# Patient Record
Sex: Male | Born: 1965 | Race: White | Hispanic: No | Marital: Married | Smoking: Never smoker
Health system: Southern US, Community
[De-identification: ages and names within clinical notes are randomized; demographics above are authoritative.]

## PROBLEM LIST (undated history)

## (undated) DIAGNOSIS — G4730 Sleep apnea, unspecified: Secondary | ICD-10-CM

## (undated) DIAGNOSIS — R7989 Other specified abnormal findings of blood chemistry: Secondary | ICD-10-CM

## (undated) DIAGNOSIS — G4710 Hypersomnia, unspecified: Secondary | ICD-10-CM

## (undated) DIAGNOSIS — J45909 Unspecified asthma, uncomplicated: Secondary | ICD-10-CM

## (undated) DIAGNOSIS — T7840XA Allergy, unspecified, initial encounter: Secondary | ICD-10-CM

## (undated) DIAGNOSIS — F329 Major depressive disorder, single episode, unspecified: Secondary | ICD-10-CM

## (undated) DIAGNOSIS — E559 Vitamin D deficiency, unspecified: Secondary | ICD-10-CM

## (undated) DIAGNOSIS — F419 Anxiety disorder, unspecified: Secondary | ICD-10-CM

## (undated) DIAGNOSIS — K589 Irritable bowel syndrome without diarrhea: Secondary | ICD-10-CM

## (undated) DIAGNOSIS — F32A Depression, unspecified: Secondary | ICD-10-CM

## (undated) DIAGNOSIS — N419 Inflammatory disease of prostate, unspecified: Secondary | ICD-10-CM

## (undated) DIAGNOSIS — I10 Essential (primary) hypertension: Secondary | ICD-10-CM

## (undated) DIAGNOSIS — K219 Gastro-esophageal reflux disease without esophagitis: Secondary | ICD-10-CM

## (undated) DIAGNOSIS — E785 Hyperlipidemia, unspecified: Secondary | ICD-10-CM

## (undated) DIAGNOSIS — R0902 Hypoxemia: Secondary | ICD-10-CM

## (undated) DIAGNOSIS — K649 Unspecified hemorrhoids: Secondary | ICD-10-CM

## (undated) DIAGNOSIS — J309 Allergic rhinitis, unspecified: Secondary | ICD-10-CM

## (undated) DIAGNOSIS — R5383 Other fatigue: Secondary | ICD-10-CM

## (undated) DIAGNOSIS — E119 Type 2 diabetes mellitus without complications: Secondary | ICD-10-CM

## (undated) DIAGNOSIS — R519 Headache, unspecified: Secondary | ICD-10-CM

## (undated) HISTORY — DX: Depression, unspecified: F32.A

## (undated) HISTORY — DX: Hyperlipidemia, unspecified: E78.5

## (undated) HISTORY — DX: Allergy, unspecified, initial encounter: T78.40XA

## (undated) HISTORY — DX: Unspecified hemorrhoids: K64.9

## (undated) HISTORY — DX: Allergic rhinitis, unspecified: J30.9

## (undated) HISTORY — DX: Anxiety disorder, unspecified: F41.9

## (undated) HISTORY — DX: Major depressive disorder, single episode, unspecified: F32.9

## (undated) HISTORY — DX: Inflammatory disease of prostate, unspecified: N41.9

## (undated) HISTORY — DX: Vitamin D deficiency, unspecified: E55.9

## (undated) HISTORY — DX: Headache, unspecified: R51.9

## (undated) HISTORY — DX: Other specified abnormal findings of blood chemistry: R79.89

## (undated) HISTORY — DX: Essential (primary) hypertension: I10

## (undated) HISTORY — DX: Sleep apnea, unspecified: G47.30

## (undated) HISTORY — DX: Other fatigue: R53.83

## (undated) HISTORY — DX: Type 2 diabetes mellitus without complications: E11.9

## (undated) HISTORY — PX: WISDOM TOOTH EXTRACTION: SHX21

## (undated) HISTORY — DX: Irritable bowel syndrome, unspecified: K58.9

## (undated) HISTORY — PX: KNEE ARTHROSCOPY: SUR90

## (undated) HISTORY — DX: Hypersomnia, unspecified: G47.10

## (undated) HISTORY — DX: Unspecified asthma, uncomplicated: J45.909

## (undated) HISTORY — DX: Hypoxemia: R09.02

## (undated) HISTORY — DX: Gastro-esophageal reflux disease without esophagitis: K21.9

---

## 1999-08-08 ENCOUNTER — Encounter: Admission: RE | Admit: 1999-08-08 | Discharge: 1999-08-08 | Payer: Self-pay | Admitting: Gastroenterology

## 1999-08-08 ENCOUNTER — Encounter: Payer: Self-pay | Admitting: Gastroenterology

## 2002-06-02 ENCOUNTER — Emergency Department (HOSPITAL_COMMUNITY): Admission: EM | Admit: 2002-06-02 | Discharge: 2002-06-03 | Payer: Self-pay | Admitting: Emergency Medicine

## 2004-04-09 ENCOUNTER — Encounter: Admission: RE | Admit: 2004-04-09 | Discharge: 2004-07-08 | Payer: Self-pay | Admitting: Family Medicine

## 2004-05-22 ENCOUNTER — Encounter: Admission: RE | Admit: 2004-05-22 | Discharge: 2004-05-22 | Payer: Self-pay | Admitting: Allergy and Immunology

## 2005-01-21 ENCOUNTER — Emergency Department (HOSPITAL_COMMUNITY): Admission: EM | Admit: 2005-01-21 | Discharge: 2005-01-21 | Payer: Self-pay | Admitting: Emergency Medicine

## 2005-04-07 ENCOUNTER — Emergency Department (HOSPITAL_COMMUNITY): Admission: EM | Admit: 2005-04-07 | Discharge: 2005-04-08 | Payer: Self-pay | Admitting: Emergency Medicine

## 2011-10-21 ENCOUNTER — Encounter (HOSPITAL_COMMUNITY): Payer: Self-pay | Admitting: Internal Medicine

## 2011-10-21 ENCOUNTER — Encounter: Payer: Self-pay | Admitting: *Deleted

## 2011-10-27 ENCOUNTER — Encounter (HOSPITAL_COMMUNITY): Payer: Self-pay

## 2011-10-27 ENCOUNTER — Ambulatory Visit (HOSPITAL_COMMUNITY)
Admission: RE | Admit: 2011-10-27 | Discharge: 2011-10-27 | Disposition: A | Discharge status: Home / Self Care | Payer: 59 | Source: Ambulatory Visit | Attending: Internal Medicine | Admitting: Internal Medicine

## 2011-10-27 VITALS — BP 112/52 | HR 98 | Ht 72.0 in | Wt 248.8 lb

## 2011-10-27 DIAGNOSIS — R0602 Shortness of breath: Secondary | ICD-10-CM | POA: Insufficient documentation

## 2011-10-27 DIAGNOSIS — I10 Essential (primary) hypertension: Secondary | ICD-10-CM | POA: Insufficient documentation

## 2011-10-27 DIAGNOSIS — R0600 Dyspnea, unspecified: Secondary | ICD-10-CM | POA: Insufficient documentation

## 2011-10-27 DIAGNOSIS — R0609 Other forms of dyspnea: Secondary | ICD-10-CM | POA: Insufficient documentation

## 2011-10-27 DIAGNOSIS — R0989 Other specified symptoms and signs involving the circulatory and respiratory systems: Secondary | ICD-10-CM | POA: Insufficient documentation

## 2011-10-27 NOTE — Patient Instructions (Addendum)
Your physician has recommended that you have a cardiopulmonary stress test (CPX). CPX testing is a non-invasive measurement of heart and lung function. It replaces a traditional treadmill stress test. This type of test provides a tremendous amount of information that relates not only to your present condition but also for future outcomes. This test combines measurements of you ventilation, respiratory gas exchange in the lungs, electrocardiogram (EKG), blood pressure and physical response before, during, and following an exercise protocol.  Your physician recommends that you schedule a follow-up appointment in: 4 WEEKS

## 2011-10-27 NOTE — Progress Notes (Signed)
Referring Physician: Dr. Blenda Nicely  Reason for Consultation: Dyspnea  HPI:  Donald Payne is a 46 y/o male who works at the [employer] with a h/o asthma, HTN, DM2 (x 8 years) and severe OSA referred for further evaluation of dyspnea.  Denies any known h/o heart disease. Has never had a stress test or cath.  Says every summer for past few summers he gets very SOB. No wheezing but does have a lot of drainage from his nose. In the winter he says he is fine and has no problem at all. Denies orthopnea, PND or LE edema. He said about 4 weeks ago was able to jog a mile early in the morning without a problem. Denies exertional CP. Says lungs feel "tight" at times. Says SOB not that much worse with exercise. Says he just feels like he can't expand his lungs and get full breath. Nonsmoker. Had allergy testing in past - mainly allergic to dust mites. Has 2 cats and a dog and feels he is worse when he is in his room.  Apparently was at Dr. Terrilee Croak office and initial sat was 89% but came up to 95% with exertion. Started on inhalers. Has since bough his own pulse oximeter and sats always 93% or greater even when he is having trouble breathing. Has not had recent CXR.   Had echo EF 55-60% borderline LVH. Normal diastolic function. Normal RV. Trace TR. Normal pulmonary pressures.    Review of Systems:     Cardiac Review of Systems: {Y] = yes [ ]  = no  Chest Pain [    ]  Resting SOB Cove.Etienne   ] Exertional SOB  [ y ]  Orthopnea [  ]   Pedal Edema [   ]    Palpitations [  ] Syncope  [  ]   Presyncope [   ]  General Review of Systems: [Y] = yes [  ]=no Constitional: recent weight change [  ]; anorexia [  ]; fatigue [  ]; nausea [  ]; night sweats [  ]; fever [  ]; or chills [  ];                                                                                                                                          Dental: poor dentition[  ];   Eye : blurred vision [  ]; diplopia [   ]; vision changes [  ];  Amaurosis  fugax[  ]; Resp: cough [  ];  wheezing[  ];  hemoptysis[  ]; shortness of breath[ y ]; paroxysmal nocturnal dyspnea[  ]; dyspnea on exertion[ y ]; or orthopnea[  ];  GI:  gallstones[  ], vomiting[  ];  dysphagia[  ]; melena[  ];  hematochezia [  ]; heartburn[  ];    GU: kidney stones [  ]; hematuria[  ];  dysuria [  ];  nocturia[  ];  history of     obstruction [  ];                 Skin: rash, swelling[  ];, hair loss[  ];  peripheral edema[  ];  or itching[  ]; Musculosketetal: myalgias[  ];  joint swelling[  ];  joint erythema[  ];  joint pain[  ];  back pain[  ];  Heme/Lymph: bruising[  ];  bleeding[  ];  anemia[  ];  Neuro: TIA[  ];  headaches[  ];  stroke[  ];  vertigo[  ];  seizures[  ];   paresthesias[  ];  difficulty walking[  ];  Psych:depression[  ]; anxiety[  ];  Other:  Past Medical History  Diagnosis Date  . Hypoxia   . Vitamin d deficiency   . Fatigue   . Allergic rhinitis   . Sleep apnea with hypersomnolence   . Hypertension   . Diabetes mellitus     History   Social History  . Marital Status: Married    Spouse Name: N/A    Number of Children: N/A  . Years of Education: N/A   Occupational History  . sheriff deputy    Social History Main Topics  . Smoking status: Never Smoker   . Smokeless tobacco: Not on file  . Alcohol Use: Yes     occasional  . Drug Use: No  . Sexually Active: Not on file   Other Topics Concern  . Not on file   Social History Narrative  . No narrative on file    Family History  Problem Relation Age of Onset  . Prostate cancer Paternal Grandfather   . Healthy    . Coronary artery disease      Deceased age 4    PHYSICAL EXAM: Filed Vitals:   10/27/11 1033  BP: 112/52  Pulse: 98    General:  Well appearing. No respiratory difficulty HEENT: normal Neck: supple. no JVD. Carotids 2+ bilat; no bruits. No lymphadenopathy or thryomegaly appreciated. Cor: PMI nondisplaced. Regular rate & rhythm. No rubs, gallops or  murmurs. Lungs: clear  Mildly prolonged expiratory phase. No wheeze Abdomen: soft, nontender, nondistended. No hepatosplenomegaly. No bruits or masses. Good bowel sounds. Extremities: no cyanosis, clubbing, rash, edema Neuro: alert & oriented x 3, cranial nerves grossly intact. moves all 4 extremities w/o difficulty. Affect pleasant.  ECG: NSR 87 No ST-T wave abnormalities.

## 2011-10-27 NOTE — Assessment & Plan Note (Signed)
I am not sure what is causing his dyspnea and borderline hypoxia. By history I am more concerned about exercise-induced bronchospasm or underlying allergies. He is also at risk for underlying CAD. Thus at this point, I have suggested we start with cardiopulmonary stress testing to assess his degree of limitation and also try to help classify it between cardiac/pulmonary/deconditioning.

## 2011-10-28 NOTE — Addendum Note (Signed)
Encounter addended by: Deitra Mayo on: 10/28/2011  8:02 AM<BR>     Documentation filed: Charges VN

## 2011-11-05 ENCOUNTER — Ambulatory Visit (HOSPITAL_COMMUNITY): Payer: 59 | Attending: Internal Medicine

## 2011-11-05 DIAGNOSIS — R0602 Shortness of breath: Secondary | ICD-10-CM | POA: Insufficient documentation

## 2011-11-17 ENCOUNTER — Ambulatory Visit (HOSPITAL_COMMUNITY)
Admission: RE | Admit: 2011-11-17 | Discharge: 2011-11-17 | Disposition: A | Discharge status: Home / Self Care | Payer: 59 | Source: Ambulatory Visit | Attending: Internal Medicine | Admitting: Internal Medicine

## 2011-11-17 ENCOUNTER — Encounter (HOSPITAL_COMMUNITY): Payer: Self-pay

## 2011-11-17 VITALS — BP 120/72 | HR 86 | Ht 72.0 in | Wt 245.8 lb

## 2011-11-17 DIAGNOSIS — R0989 Other specified symptoms and signs involving the circulatory and respiratory systems: Secondary | ICD-10-CM | POA: Insufficient documentation

## 2011-11-17 DIAGNOSIS — R0602 Shortness of breath: Secondary | ICD-10-CM

## 2011-11-17 DIAGNOSIS — R0609 Other forms of dyspnea: Secondary | ICD-10-CM | POA: Insufficient documentation

## 2011-11-17 DIAGNOSIS — R0600 Dyspnea, unspecified: Secondary | ICD-10-CM

## 2011-11-17 NOTE — Progress Notes (Signed)
Referring Physician: Dr. Blenda Nicely PCP: Dr. Margarita Rana  HPI:  Donald Payne is a 46 y/o male who works at the [employer] with a h/o asthma, HTN, DM2 (x 8 years) and severe OSA referred for further evaluation of dyspnea.  Denies any known h/o heart disease. Has never had a stress test or cath.  Nonsmoker.  Had allergy testing in past - mainly allergic to dust mites.  Has 2 cats and a dog and feels he is worse when he is in his room.  Apparently was at Dr. Terrilee Croak office and initial sat was 89% but came up to 95% with exertion. Started on inhalers. Has since bough his own pulse oximeter and sats always 93% or greater even when he is having trouble breathing. Has not had recent CXR.   Had echo EF 55-60% borderline LVH. Normal diastolic function. Normal RV. Trace TR. Normal pulmonary pressures.   CPX 11/05/11 Peak VO2 29.1 ml/kg/min predicted peak VO2 93.4%, VE/VCO2 slope 27.3, RER 1.33. No O2 desaturations. (Pulse ox stayed at 97%)  He returns for follow up today.  Dyspnea improved when temperatures declined.  Noted increased dyspnea after taking long hot shower, pulse-ox after shower ~90%.  He denies chest pain.  No edema, orthopnea/PND. Started on Singulair.    Review of Systems: All pertinent positives and negatives as in HPI, otherwise negative.   Past Medical History  Diagnosis Date  . Hypoxia   . Vitamin D deficiency   . Fatigue   . Allergic rhinitis   . Sleep apnea with hypersomnolence   . Hypertension   . Diabetes mellitus    Current Outpatient Prescriptions on File Prior to Encounter  Medication Sig Dispense Refill  . albuterol (PROVENTIL HFA;VENTOLIN HFA) 108 (90 BASE) MCG/ACT inhaler Inhale 2 puffs into the lungs 4 (four) times daily as needed.      Marland Kitchen albuterol (PROVENTIL) (2.5 MG/3ML) 0.083% nebulizer solution Take 2.5 mg by nebulization 4 (four) times daily as needed.      . ALPRAZolam (XANAX) 1 MG tablet Take 1 mg by mouth 2 (two) times daily.      Marland Kitchen aspirin 81 MG tablet Take 81  mg by mouth daily.      . budesonide (PULMICORT) 0.25 MG/2ML nebulizer solution Take 0.25 mg by nebulization 2 (two) times daily.      Marland Kitchen buPROPion (WELLBUTRIN XL) 300 MG 24 hr tablet Take 300 mg by mouth daily.      . cholecalciferol (VITAMIN D) 1000 UNITS tablet Take 1,000 Units by mouth daily. Takes 3 tabs daily      . ciclesonide (ALVESCO) 160 MCG/ACT inhaler Inhale 1 puff into the lungs 2 (two) times daily.      . Cinnamon 500 MG TABS Take 500 mg by mouth 2 (two) times daily.      . fish oil-omega-3 fatty acids 1000 MG capsule Take 1 g by mouth 3 (three) times daily.      . metFORMIN (GLUCOPHAGE) 500 MG tablet Take 500 mg by mouth 2 (two) times daily with a meal.      . Multiple Vitamin (MULTIVITAMIN) tablet Take 1 tablet by mouth daily.      . quinapril-hydrochlorothiazide (ACCURETIC) 20-12.5 MG per tablet Take 2 tablets by mouth daily.       . sertraline (ZOLOFT) 100 MG tablet Take 200 mg by mouth daily.       Marland Kitchen testosterone cypionate (DEPOTESTOTERONE CYPIONATE) 200 MG/ML injection Inject 200 mg into the muscle every 14 (fourteen) days. Inject 10ml every  2 weeks      . zolpidem (AMBIEN CR) 12.5 MG CR tablet Take 6.25 mg by mouth at bedtime as needed.        No Known Allergies  PHYSICAL EXAM: Filed Vitals:   11/17/11 1001  BP: 120/72  Pulse: 86  Height: 6' (1.829 m)  Weight: 245 lb 12.8 oz (111.494 kg)  SpO2: 97%    General:  Well appearing. No respiratory difficulty HEENT: normal Neck: supple. no JVD. Carotids 2+ bilat; no bruits. No lymphadenopathy or thryomegaly appreciated. Cor: PMI nondisplaced. Regular rate & rhythm. No rubs, gallops or murmurs. Lungs: clear  Mildly prolonged expiratory phase. No wheeze Abdomen: soft, nontender, nondistended. No hepatosplenomegaly. No bruits or masses. Good bowel sounds. Extremities: no cyanosis, clubbing, rash, edema Neuro: alert & oriented x 3, cranial nerves grossly intact. moves all 4 extremities w/o difficulty. Affect  pleasant.

## 2011-11-17 NOTE — Assessment & Plan Note (Signed)
Patient seen and examined with Donald Blossom, PA-C. We discussed all aspects of the encounter. I agree with the assessment as stated above. My thoughts below. Reviewed CPX results with him. His test was normal with no cardiopulmonary abnormalities. Limitation appeared to be deconditioning and obesity. Advised to start exercise program. He seems to be particularly sensitive to working out in the humidity - his brother has the same problem - so will need to exercise indoors in the summer. Can return in 6 months if symptoms not improving.

## 2012-03-27 ENCOUNTER — Other Ambulatory Visit: Payer: Self-pay

## 2012-12-16 ENCOUNTER — Other Ambulatory Visit: Payer: Self-pay

## 2016-03-08 DIAGNOSIS — Z0100 Encounter for examination of eyes and vision without abnormal findings: Secondary | ICD-10-CM | POA: Diagnosis not present

## 2016-04-17 ENCOUNTER — Encounter: Payer: Self-pay | Admitting: Internal Medicine

## 2016-04-17 DIAGNOSIS — E119 Type 2 diabetes mellitus without complications: Secondary | ICD-10-CM | POA: Diagnosis not present

## 2016-04-17 DIAGNOSIS — J309 Allergic rhinitis, unspecified: Secondary | ICD-10-CM | POA: Diagnosis not present

## 2016-04-17 DIAGNOSIS — R1032 Left lower quadrant pain: Secondary | ICD-10-CM | POA: Diagnosis not present

## 2016-05-12 DIAGNOSIS — G4733 Obstructive sleep apnea (adult) (pediatric): Secondary | ICD-10-CM | POA: Diagnosis not present

## 2016-05-20 DIAGNOSIS — E119 Type 2 diabetes mellitus without complications: Secondary | ICD-10-CM | POA: Diagnosis not present

## 2016-05-21 ENCOUNTER — Ambulatory Visit (INDEPENDENT_AMBULATORY_CARE_PROVIDER_SITE_OTHER): Payer: 59 | Admitting: Internal Medicine

## 2016-05-21 ENCOUNTER — Encounter: Payer: Self-pay | Admitting: Internal Medicine

## 2016-05-21 VITALS — BP 110/72 | HR 80 | Ht 72.0 in | Wt 238.6 lb

## 2016-05-21 DIAGNOSIS — R1032 Left lower quadrant pain: Secondary | ICD-10-CM | POA: Diagnosis not present

## 2016-05-21 DIAGNOSIS — Z1211 Encounter for screening for malignant neoplasm of colon: Secondary | ICD-10-CM | POA: Diagnosis not present

## 2016-05-21 DIAGNOSIS — K219 Gastro-esophageal reflux disease without esophagitis: Secondary | ICD-10-CM | POA: Diagnosis not present

## 2016-05-21 NOTE — Patient Instructions (Signed)
You will be contacted regarding scheduling your colonoscopy

## 2016-05-21 NOTE — Progress Notes (Signed)
HISTORY OF PRESENT ILLNESS:  Donald Payne is a 51 y.o. male , [facility], who is referred by his primary care provider Donald Payne with chief complaints of left lower quadrant abdominal pain, the need for colonoscopy, and GERD. First, patient reports a one-month history of left lower quadrant pain with slight radiation toward the left flank. There was no associated fever or bleeding. The discomfort was not affected by meals or bowel movements. No change in bowel movements or bowel habits. He does carry a diagnosis of irritable bowel syndrome evaluated by Donald Payne remotely. According to the patient he had a flexible sigmoidoscopy. Also told he may be lactose intolerant. He does describe some chronic but stable alternating bowel habits. He being C may have diverticulosis as well. He does have a history of kidney stones but denies hematuria or dysuria. His discomfort resolved but then returned translate for a few days. He could not identify any exacerbating or relieving factors. No nocturnal component. No problems with pain at this time. Next, he reports chronic GERD. He was placed on PPI which helped. He did develop some loose stools which he thought possibly was related PPI thus he stopped. No dysphagia. He has had decrease weight loss which he attributes to his diabetic medication. I was sorry to hear that his brother passed away last week a 15  REVIEW OF SYSTEMS:  All non-GI ROS negative except for sinus and allergy trouble, urinary leakage, insomnia  Past Medical History:  Diagnosis Date  . Allergic rhinitis   . Anxiety   . Asthma   . Depression   . Diabetes mellitus (HCC)   . Dyslipidemia   . Fatigue   . GERD (gastroesophageal reflux disease)   . Hemorrhoids   . Hypertension   . Hypoxia   . IBS (irritable bowel syndrome)   . Low testosterone   . Prostatitis   . Sleep apnea with hypersomnolence   . Vitamin D deficiency     Past Surgical History:   Procedure Laterality Date  . WISDOM TOOTH EXTRACTION      Social History Donald Payne  reports that he has never smoked. He has never used smokeless tobacco. He reports that he drinks alcohol. He reports that he does not use drugs.  family history includes Diabetes in his mother and paternal grandmother; Heart disease in his father and paternal grandmother; Prostate cancer in his paternal grandfather.  No Known Allergies     PHYSICAL EXAMINATION: Vital signs: BP 110/72   Pulse 80   Ht 6' (1.829 m)   Wt 238 lb 9.6 oz (108.2 kg)   BMI 32.36 kg/m   Constitutional: generally well-appearing, no acute distress Psychiatric: alert and oriented x3, cooperative Eyes: extraocular movements intact, anicteric, conjunctiva pink Mouth: oral pharynx moist, no lesions Neck: supple no lymphadenopathy Cardiovascular: heart regular rate and rhythm, no murmur Lungs: clear to auscultation bilaterally Abdomen: soft, nontender, nondistended, no obvious ascites, no peritoneal signs, normal bowel sounds, no organomegaly Rectal:Deferred until colonoscopy Extremities: no clubbing cyanosis or lower extremity edema bilaterally Skin: no lesions on visible extremities Neuro: No focal deficits. Cranial nerves intact  ASSESSMENT:  #1. Transient left lower quadrant pain as described. Possibly related to particular disease. Other potential etiologies include musculoskeletal, urologic. #2. Colon cancer screening. Baseline risk. Appropriate candidate without contraindication #3. GERD without alarm symptoms #4. Reported history of IBS, lactose intolerance, and diverticulosis  PLAN:  #1. Reflux precautions #2. Resume PPI to control GERD symptoms. Observe  for recurrence of diarrhea #3. Colonoscopy to provide colorectal cancer screening and evaluate left-sided abdominal complaints.The nature of the procedure, as well as the risks, benefits, and alternatives were carefully and thoroughly reviewed with the  patient. Ample time for discussion and questions allowed. The patient understood, was satisfied, and agreed to proceed. #4. Adjust diabetic medications the day of his procedure to avoid and wanted hypoglycemia  A copy of this consultation note has been sent to Donald Payne

## 2016-05-29 DIAGNOSIS — I10 Essential (primary) hypertension: Secondary | ICD-10-CM | POA: Diagnosis not present

## 2016-05-29 DIAGNOSIS — E7800 Pure hypercholesterolemia, unspecified: Secondary | ICD-10-CM | POA: Diagnosis not present

## 2016-05-29 DIAGNOSIS — E1165 Type 2 diabetes mellitus with hyperglycemia: Secondary | ICD-10-CM | POA: Diagnosis not present

## 2016-06-09 ENCOUNTER — Encounter: Payer: Self-pay | Admitting: Internal Medicine

## 2016-07-08 DIAGNOSIS — H00022 Hordeolum internum right lower eyelid: Secondary | ICD-10-CM | POA: Diagnosis not present

## 2016-08-06 ENCOUNTER — Encounter: Payer: Self-pay | Admitting: Internal Medicine

## 2016-08-06 ENCOUNTER — Ambulatory Visit (AMBULATORY_SURGERY_CENTER): Payer: Self-pay

## 2016-08-06 VITALS — Ht 72.0 in | Wt 230.0 lb

## 2016-08-06 DIAGNOSIS — R1032 Left lower quadrant pain: Secondary | ICD-10-CM

## 2016-08-06 MED ORDER — NA SULFATE-K SULFATE-MG SULF 17.5-3.13-1.6 GM/177ML PO SOLN: 1.0000 | Freq: Once | ORAL | 0 refills | Status: AC

## 2016-08-06 NOTE — Progress Notes (Signed)
Denies allergies to eggs or soy products. Denies complication of anesthesia or sedation. Denies use of weight loss medication. Denies use of O2.   Emmi instructions declined.  

## 2016-08-20 ENCOUNTER — Encounter: Payer: Self-pay | Admitting: Internal Medicine

## 2016-08-20 ENCOUNTER — Ambulatory Visit (AMBULATORY_SURGERY_CENTER): Payer: 59 | Admitting: Internal Medicine

## 2016-08-20 VITALS — BP 107/77 | HR 84 | Temp 99.3°F | Resp 11 | Ht 72.0 in | Wt 238.0 lb

## 2016-08-20 DIAGNOSIS — Z1211 Encounter for screening for malignant neoplasm of colon: Secondary | ICD-10-CM | POA: Diagnosis present

## 2016-08-20 DIAGNOSIS — Z1212 Encounter for screening for malignant neoplasm of rectum: Secondary | ICD-10-CM | POA: Diagnosis not present

## 2016-08-20 DIAGNOSIS — R1032 Left lower quadrant pain: Secondary | ICD-10-CM

## 2016-08-20 DIAGNOSIS — K5730 Diverticulosis of large intestine without perforation or abscess without bleeding: Secondary | ICD-10-CM | POA: Diagnosis not present

## 2016-08-20 MED ORDER — SODIUM CHLORIDE 0.9 % IV SOLN: 500.0000 mL | INTRAVENOUS | Status: DC

## 2016-08-20 NOTE — Op Note (Signed)
[facility] Patient Name: Donald Payne Procedure Date: 08/20/2016 7:23 AM MRN: [id number] Endoscopist: Wilhemina Bonito. Marina Goodell , MD Age: 51 Referring MD:  Date of Birth: Jul 19, 1965 Gender: Male Account #: [id number] Procedure:                Colonoscopy Indications:              Screening for colorectal malignant neoplasm Medicines:                Monitored Anesthesia Care Procedure:                Pre-Anesthesia Assessment:                           - Prior to the procedure, a History and Physical                            was performed, and patient medications and                            allergies were reviewed. The patient's tolerance of                            previous anesthesia was also reviewed. The risks                            and benefits of the procedure and the sedation                            options and risks were discussed with the patient.                            All questions were answered, and informed consent                            was obtained. Prior Anticoagulants: The patient has                            taken no previous anticoagulant or antiplatelet                            agents. ASA Grade Assessment: II - A patient with                            mild systemic disease. After reviewing the risks                            and benefits, the patient was deemed in                            satisfactory condition to undergo the procedure.                           After obtaining informed consent, the colonoscope  was passed under direct vision. Throughout the                            procedure, the patient's blood pressure, pulse, and                            oxygen saturations were monitored continuously. The                            Colonoscope was introduced through the anus and                            advanced to the the cecum, identified by                            appendiceal orifice and  ileocecal valve. The                            ileocecal valve, appendiceal orifice, and rectum                            were photographed. The quality of the bowel                            preparation was good. The colonoscopy was performed                            without difficulty. The patient tolerated the                            procedure well. The bowel preparation used was                            SUPREP. Scope In: 8:28:29 AM Scope Out: 8:42:53 AM Scope Withdrawal Time: 0 hours 12 minutes 7 seconds  Total Procedure Duration: 0 hours 14 minutes 24 seconds  Findings:                 Multiple medium-mouthed diverticula were found in                            the sigmoid colon.                           The exam was otherwise without abnormality on                            direct and retroflexion views. Complications:            No immediate complications. Estimated blood loss:                            None. Estimated Blood Loss:     Estimated blood loss: none. Impression:               - Diverticulosis in the sigmoid colon.                           -  The examination was otherwise normal on direct                            and retroflexion views.                           - No specimens collected. Recommendation:           - Repeat colonoscopy in 10 years for screening                            purposes.                           - Patient has a contact number available for                            emergencies. The signs and symptoms of potential                            delayed complications were discussed with the                            patient. Return to normal activities tomorrow.                            Written discharge instructions were provided to the                            patient.                           - Resume previous diet.                           - Continue present medications.                           - Daily fiber  supplementation and high fiber diet John N. Marina Goodell, MD 08/20/2016 8:48:44 AM This report has been signed electronically.

## 2016-08-20 NOTE — Patient Instructions (Signed)
Handouts given: Diverticulosis and High Fiber Diet    YOU HAD AN ENDOSCOPIC PROCEDURE TODAY AT THE [facility]:   Refer to the procedure report that was given to you for any specific questions about what was found during the examination.  If the procedure report does not answer your questions, please call your gastroenterologist to clarify.  If you requested that your care partner not be given the details of your procedure findings, then the procedure report has been included in a sealed envelope for you to review at your convenience later.  YOU SHOULD EXPECT: Some feelings of bloating in the abdomen. Passage of more gas than usual.  Walking can help get rid of the air that was put into your GI tract during the procedure and reduce the bloating. If you had a lower endoscopy (such as a colonoscopy or flexible sigmoidoscopy) you may notice spotting of blood in your stool or on the toilet paper. If you underwent a bowel prep for your procedure, you may not have a normal bowel movement for a few days.  Please Note:  You might notice some irritation and congestion in your nose or some drainage.  This is from the oxygen used during your procedure.  There is no need for concern and it should clear up in a day or so.  SYMPTOMS TO REPORT IMMEDIATELY:   Following lower endoscopy (colonoscopy or flexible sigmoidoscopy):  Excessive amounts of blood in the stool  Significant tenderness or worsening of abdominal pains  Swelling of the abdomen that is new, acute  Fever of 100F or higher   For urgent or emergent issues, a gastroenterologist can be reached at any hour by calling [phone].   DIET:  We do recommend a small meal at first, but then you may proceed to your regular diet.  Drink plenty of fluids but you should avoid alcoholic beverages for 24 hours.  ACTIVITY:  You should plan to take it easy for the rest of today and you should NOT DRIVE or use heavy machinery until tomorrow  (because of the sedation medicines used during the test).    FOLLOW UP: Our staff will call the number listed on your records the next business day following your procedure to check on you and address any questions or concerns that you may have regarding the information given to you following your procedure. If we do not reach you, we will leave a message.  However, if you are feeling well and you are not experiencing any problems, there is no need to return our call.  We will assume that you have returned to your regular daily activities without incident.  If any biopsies were taken you will be contacted by phone or by letter within the next 1-3 weeks.  Please call us at [phone] if you have not heard about the biopsies in 3 weeks.    SIGNATURES/CONFIDENTIALITY: You and/or your care partner have signed paperwork which will be entered into your electronic medical record.  These signatures attest to the fact that that the information above on your After Visit Summary has been reviewed and is understood.  Full responsibility of the confidentiality of this discharge information lies with you and/or your care-partner.

## 2016-08-20 NOTE — Progress Notes (Signed)
Report to PACU, RN, vss, BBS= Clear.  

## 2016-08-20 NOTE — Progress Notes (Signed)
Pt's states no medical or surgical changes since previsit or office visit. 

## 2016-08-21 ENCOUNTER — Telehealth: Payer: Self-pay | Admitting: *Deleted

## 2016-08-21 NOTE — Telephone Encounter (Signed)
  Follow up Call-  Call back number 08/20/2016  Post procedure Call Back phone  # [phone] cell  Permission to leave phone message Yes  Some recent data might be hidden     Patient questions:  Do you have a fever, pain , or abdominal swelling? No. Pain Score  0 *  Have you tolerated food without any problems? Yes.    Have you been able to return to your normal activities? Yes.    Do you have any questions about your discharge instructions: Diet   No. Medications  No. Follow up visit  No.  Do you have questions or concerns about your Care? No.  Actions: * If pain score is 4 or above: No action needed, pain <4.

## 2016-09-03 DIAGNOSIS — G4733 Obstructive sleep apnea (adult) (pediatric): Secondary | ICD-10-CM | POA: Diagnosis not present

## 2016-09-04 DIAGNOSIS — I10 Essential (primary) hypertension: Secondary | ICD-10-CM | POA: Diagnosis not present

## 2016-09-04 DIAGNOSIS — E7800 Pure hypercholesterolemia, unspecified: Secondary | ICD-10-CM | POA: Diagnosis not present

## 2016-09-04 DIAGNOSIS — E1165 Type 2 diabetes mellitus with hyperglycemia: Secondary | ICD-10-CM | POA: Diagnosis not present

## 2016-10-01 DIAGNOSIS — M7502 Adhesive capsulitis of left shoulder: Secondary | ICD-10-CM | POA: Diagnosis not present

## 2016-10-01 DIAGNOSIS — M7582 Other shoulder lesions, left shoulder: Secondary | ICD-10-CM | POA: Diagnosis not present

## 2016-11-10 ENCOUNTER — Other Ambulatory Visit: Payer: Self-pay | Admitting: Family Medicine

## 2016-11-10 ENCOUNTER — Ambulatory Visit
Admission: RE | Admit: 2016-11-10 | Discharge: 2016-11-10 | Disposition: A | Discharge status: Home / Self Care | Payer: 59 | Source: Ambulatory Visit | Attending: Family Medicine | Admitting: Family Medicine

## 2016-11-10 DIAGNOSIS — M779 Enthesopathy, unspecified: Secondary | ICD-10-CM

## 2016-11-10 DIAGNOSIS — M19012 Primary osteoarthritis, left shoulder: Secondary | ICD-10-CM | POA: Diagnosis not present

## 2017-02-20 DIAGNOSIS — E7800 Pure hypercholesterolemia, unspecified: Secondary | ICD-10-CM | POA: Diagnosis not present

## 2017-02-20 DIAGNOSIS — E1165 Type 2 diabetes mellitus with hyperglycemia: Secondary | ICD-10-CM | POA: Diagnosis not present

## 2017-02-27 DIAGNOSIS — E1165 Type 2 diabetes mellitus with hyperglycemia: Secondary | ICD-10-CM | POA: Diagnosis not present

## 2017-02-27 DIAGNOSIS — E7800 Pure hypercholesterolemia, unspecified: Secondary | ICD-10-CM | POA: Diagnosis not present

## 2017-02-27 DIAGNOSIS — I10 Essential (primary) hypertension: Secondary | ICD-10-CM | POA: Diagnosis not present

## 2017-06-04 DIAGNOSIS — Z79899 Other long term (current) drug therapy: Secondary | ICD-10-CM | POA: Diagnosis not present

## 2017-07-23 DIAGNOSIS — Z79891 Long term (current) use of opiate analgesic: Secondary | ICD-10-CM | POA: Diagnosis not present

## 2017-08-24 DIAGNOSIS — E1165 Type 2 diabetes mellitus with hyperglycemia: Secondary | ICD-10-CM | POA: Diagnosis not present

## 2017-08-24 DIAGNOSIS — E7800 Pure hypercholesterolemia, unspecified: Secondary | ICD-10-CM | POA: Diagnosis not present

## 2017-08-27 DIAGNOSIS — I10 Essential (primary) hypertension: Secondary | ICD-10-CM | POA: Diagnosis not present

## 2017-08-27 DIAGNOSIS — E1165 Type 2 diabetes mellitus with hyperglycemia: Secondary | ICD-10-CM | POA: Diagnosis not present

## 2017-08-27 DIAGNOSIS — E7800 Pure hypercholesterolemia, unspecified: Secondary | ICD-10-CM | POA: Diagnosis not present

## 2017-09-11 ENCOUNTER — Ambulatory Visit: Payer: 59 | Admitting: Internal Medicine

## 2017-09-25 DIAGNOSIS — J309 Allergic rhinitis, unspecified: Secondary | ICD-10-CM | POA: Diagnosis not present

## 2017-09-25 DIAGNOSIS — I10 Essential (primary) hypertension: Secondary | ICD-10-CM | POA: Diagnosis not present

## 2017-09-25 DIAGNOSIS — E7800 Pure hypercholesterolemia, unspecified: Secondary | ICD-10-CM | POA: Diagnosis not present

## 2017-10-27 ENCOUNTER — Ambulatory Visit: Payer: 59 | Admitting: Internal Medicine

## 2017-10-27 ENCOUNTER — Encounter: Payer: Self-pay | Admitting: Internal Medicine

## 2017-10-27 ENCOUNTER — Encounter

## 2017-10-27 VITALS — BP 110/62 | HR 90 | Ht 72.0 in | Wt 236.0 lb

## 2017-10-27 DIAGNOSIS — K5730 Diverticulosis of large intestine without perforation or abscess without bleeding: Secondary | ICD-10-CM

## 2017-10-27 DIAGNOSIS — R1032 Left lower quadrant pain: Secondary | ICD-10-CM

## 2017-10-27 DIAGNOSIS — R194 Change in bowel habit: Secondary | ICD-10-CM | POA: Diagnosis not present

## 2017-10-27 NOTE — Patient Instructions (Addendum)
As discussed with Dr. Marina GoodellPerry, take 1-2 tablespoons of Metamucil daily  Take 1 Restora twice a day for 2 weeks.    Please follow up with Dr. Marina GoodellPerry in two months (November)

## 2017-10-27 NOTE — Progress Notes (Signed)
HISTORY OF PRESENT ILLNESS:  Donald Payne is a 52 y.o. male , [facility], who presents today with a chief complaint of change in bowel habits. The patient was last evaluated in this office April 2018 regarding left lower quadrant pain without associations. Previously evaluated by Dr. Carman Ching and labeled with irritable bowel syndrome. He underwent complete colonoscopy 08/20/2016. He was found to have significant sigmoid diverticulosis. Examination was otherwise normal. Follow-up in 10 years recommended. Patient tells me that he has continued with occasional left lower quadrant pain, but no worse. Improved after passing flatus and a predictable manner.GI complaint today is a 3 month history of change in bowel habits as manifested initially by significant diarrhea. Problems with urgency. Over time the symptoms have improved. He did take fiber one which helps some aspects of his symptom complex. He also taken and unspecified probiotic. No problems with bleeding. No nocturnal component. His weight has been stable. No fevers or other issues. Currently he describes having a normal bowel movement in the morning and may or may not have one or 2 somewhat loose bowels thereafter. May go a day without a bowel movement. GI review of systems is otherwise negative except for bloating and belching. No new medications. However, he did have an increase in his lithium dose around the time of his bowel habit change  REVIEW OF SYSTEMS:  All non-GI ROS negative unless otherwise stated in the history of present illness except for visual change  Past Medical History:  Diagnosis Date  . Allergic rhinitis   . Allergy   . Anxiety   . Asthma   . Depression   . Diabetes mellitus (HCC)   . Dyslipidemia   . Fatigue   . GERD (gastroesophageal reflux disease)   . Hemorrhoids   . Hyperlipidemia   . Hypertension   . Hypoxia   . IBS (irritable bowel syndrome)   . Low testosterone   . Prostatitis    . Sleep apnea    uses CPAP  . Sleep apnea with hypersomnolence   . Vitamin D deficiency     Past Surgical History:  Procedure Laterality Date  . WISDOM TOOTH EXTRACTION      Social History TJ KITCHINGS  reports that he has never smoked. He has never used smokeless tobacco. He reports that he drinks alcohol. He reports that he does not use drugs.  family history includes Coronary artery disease in his unknown relative; Diabetes in his mother and paternal grandmother; Healthy in his unknown relative; Heart disease in his father and paternal grandmother; Prostate cancer in his paternal grandfather.  No Known Allergies     PHYSICAL EXAMINATION: Vital signs: BP 110/62   Pulse 90   Ht 6' (1.829 m)   Wt 236 lb (107 kg)   BMI 32.01 kg/m   Constitutional: generally well-appearing, no acute distress Psychiatric: alert and oriented x3, cooperative Eyes: extraocular movements intact, anicteric, conjunctiva pink Mouth: oral pharynx moist, no lesions Neck: supple no lymphadenopathy Cardiovascular: heart regular rate and rhythm, no murmur Lungs: clear to auscultation bilaterally Abdomen: soft, nontender, nondistended, no obvious ascites, no peritoneal signs, normal bowel sounds, no organomegaly Rectal:omitted Extremities: no clubbing, cyanosis, or lower extremity edema bilaterally Skin: no lesions on visible extremities Neuro: No focal deficits. Cranial nerves intact  ASSESSMENT:  #1. Change in bowel habits as manifested by increased frequency. Slowly improving with time. Suspect post infectious irritable bowel syndrome. No alarm features #2. Intermittent left lower quadrant pain consistent with  diverticular spasm and/or trapped gas #3. Colonoscopy July 2018 with diverticulosis  PLAN:  #1. Daily fiber supplementation with Metamucil one or 2 tablespoons #2. Probiotic Restora 1 twice daily for 2 weeks. Samples given #3. Routine GI follow-up 2 months

## 2017-12-03 DIAGNOSIS — R5383 Other fatigue: Secondary | ICD-10-CM | POA: Diagnosis not present

## 2017-12-15 DIAGNOSIS — R232 Flushing: Secondary | ICD-10-CM | POA: Diagnosis not present

## 2017-12-15 DIAGNOSIS — R197 Diarrhea, unspecified: Secondary | ICD-10-CM | POA: Diagnosis not present

## 2017-12-15 DIAGNOSIS — G4733 Obstructive sleep apnea (adult) (pediatric): Secondary | ICD-10-CM | POA: Diagnosis not present

## 2017-12-15 DIAGNOSIS — R51 Headache: Secondary | ICD-10-CM | POA: Diagnosis not present

## 2017-12-15 DIAGNOSIS — R5383 Other fatigue: Secondary | ICD-10-CM | POA: Diagnosis not present

## 2017-12-16 ENCOUNTER — Telehealth: Payer: Self-pay | Admitting: Internal Medicine

## 2017-12-16 NOTE — Telephone Encounter (Signed)
Spoke to the doctor.  Advised that the patient should make GI follow-up as previously recommended

## 2017-12-17 ENCOUNTER — Other Ambulatory Visit: Payer: Self-pay | Admitting: Family Medicine

## 2017-12-17 DIAGNOSIS — R51 Headache: Principal | ICD-10-CM

## 2017-12-17 DIAGNOSIS — R519 Headache, unspecified: Secondary | ICD-10-CM

## 2017-12-27 ENCOUNTER — Ambulatory Visit
Admission: RE | Admit: 2017-12-27 | Discharge: 2017-12-27 | Disposition: A | Discharge status: Home / Self Care | Payer: 59 | Source: Ambulatory Visit | Attending: Family Medicine | Admitting: Family Medicine

## 2017-12-27 DIAGNOSIS — R51 Headache: Principal | ICD-10-CM

## 2017-12-27 DIAGNOSIS — R519 Headache, unspecified: Secondary | ICD-10-CM

## 2017-12-28 DIAGNOSIS — R197 Diarrhea, unspecified: Secondary | ICD-10-CM | POA: Diagnosis not present

## 2017-12-29 DIAGNOSIS — R197 Diarrhea, unspecified: Secondary | ICD-10-CM | POA: Diagnosis not present

## 2017-12-29 DIAGNOSIS — R51 Headache: Secondary | ICD-10-CM | POA: Diagnosis not present

## 2017-12-29 DIAGNOSIS — G4730 Sleep apnea, unspecified: Secondary | ICD-10-CM | POA: Diagnosis not present

## 2018-01-22 DIAGNOSIS — Z23 Encounter for immunization: Secondary | ICD-10-CM | POA: Diagnosis not present

## 2018-01-22 DIAGNOSIS — R51 Headache: Secondary | ICD-10-CM | POA: Diagnosis not present

## 2018-01-22 DIAGNOSIS — E7800 Pure hypercholesterolemia, unspecified: Secondary | ICD-10-CM | POA: Diagnosis not present

## 2018-01-22 DIAGNOSIS — G4730 Sleep apnea, unspecified: Secondary | ICD-10-CM | POA: Diagnosis not present

## 2018-01-28 DIAGNOSIS — R0689 Other abnormalities of breathing: Secondary | ICD-10-CM | POA: Diagnosis not present

## 2018-02-01 DIAGNOSIS — G4733 Obstructive sleep apnea (adult) (pediatric): Secondary | ICD-10-CM | POA: Diagnosis not present

## 2018-02-12 DIAGNOSIS — Z0100 Encounter for examination of eyes and vision without abnormal findings: Secondary | ICD-10-CM | POA: Diagnosis not present

## 2018-02-16 ENCOUNTER — Encounter: Payer: Self-pay | Admitting: Internal Medicine

## 2018-02-16 ENCOUNTER — Ambulatory Visit: Payer: 59 | Admitting: Internal Medicine

## 2018-02-16 VITALS — BP 80/50 | HR 76 | Ht 72.0 in | Wt 235.0 lb

## 2018-02-16 DIAGNOSIS — K580 Irritable bowel syndrome with diarrhea: Secondary | ICD-10-CM

## 2018-02-16 DIAGNOSIS — K219 Gastro-esophageal reflux disease without esophagitis: Secondary | ICD-10-CM | POA: Diagnosis not present

## 2018-02-16 MED ORDER — RIFAXIMIN 550 MG PO TABS: 550.0000 mg | ORAL_TABLET | Freq: Three times a day (TID) | ORAL | 0 refills | Status: DC

## 2018-02-16 MED ORDER — PANTOPRAZOLE SODIUM 40 MG PO TBEC: 40.0000 mg | DELAYED_RELEASE_TABLET | Freq: Every day | ORAL | 3 refills | Status: DC

## 2018-02-16 NOTE — Progress Notes (Signed)
HISTORY OF PRESENT ILLNESS:  Donald Payne is a 53 y.o. male, [facility], who was last evaluated October 27, 2017 regarding change in bowel habits.  See that dictation for details.  Previous colonoscopy July 2018 revealed diverticulosis.  He was treated with fiber and probiotic.  Follow-up at this time.  Because of complaints of flushing his primary care provider tested for carcinoid syndrome which was negative.  Patient tells me that his bowel habit issues continue to improve though he will have occasional serious urgency associated with loose bowel movements.  Often with dietary indiscretion.  Hyperactive bowel sounds with increased gas also present.  More recently he has been having problems with GERD.  He took old PPI which helped some.  No dysphasia.  REVIEW OF SYSTEMS:  All non-GI ROS negative unless otherwise stated in the HPI except for anxiety, depression, sinus and allergies  Past Medical History:  Diagnosis Date  . Allergic rhinitis   . Allergy   . Anxiety   . Asthma   . Depression   . Diabetes mellitus (HCC)   . Dyslipidemia   . Fatigue   . GERD (gastroesophageal reflux disease)   . Hemorrhoids   . Hyperlipidemia   . Hypertension   . Hypoxia   . IBS (irritable bowel syndrome)   . Low testosterone   . Prostatitis   . Sleep apnea    uses CPAP  . Sleep apnea with hypersomnolence   . Vitamin D deficiency     Past Surgical History:  Procedure Laterality Date  . WISDOM TOOTH EXTRACTION      Social History Donald Payne  reports that he has never smoked. He has never used smokeless tobacco. He reports current alcohol use. He reports that he does not use drugs.  family history includes Coronary artery disease in an other family member; Diabetes in his mother and paternal grandmother; Healthy in an other family member; Heart disease in his father and paternal grandmother; Prostate cancer in his paternal grandfather.  No Known Allergies      PHYSICAL EXAMINATION: Vital signs: BP (!) 80/50 (BP Location: Left Arm, Patient Position: Sitting, Cuff Size: Normal)   Pulse 76   Ht 6' (1.829 m) Comment: height measured without shoes  Wt 235 lb (106.6 kg)   BMI 31.87 kg/m   Constitutional: generally well-appearing, no acute distress Psychiatric: alert and oriented x3, cooperative Eyes: extraocular movements intact, anicteric, conjunctiva pink Mouth: oral pharynx moist, no lesions Neck: supple no lymphadenopathy Cardiovascular: heart regular rate and rhythm, no murmur Lungs: clear to auscultation bilaterally Abdomen: soft, nontender, nondistended, no obvious ascites, no peritoneal signs, normal bowel sounds, no organomegaly Rectal: Mid Extremities: no clubbing, cyanosis or lower extremity edema bilaterally Skin: no lesions on visible extremities Neuro: No focal deficits.  Cranial nerves intact  ASSESSMENT:  1.  Intermittent loose stools with urgency.  IBS versus bacterial overgrowth and a longstanding diabetic. 2.  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  Recent problems with GERD symptoms   PLAN:  1.  Reflux precautions 2.  Prescribe pantoprazole 40 mg daily.  May use on demand 3.  Prescribe Xifaxan 550 mg 3 times daily for 2 weeks.  May use periodically on demand if helpful 4.  Screening colonoscopy 2028 5.  Resume general care with PCP.  GI follow-up as needed  25 minutes spent face-to-face with the patient.  Greater than 50% the time used for counseling regarding his bowel habit issues and recurrent GERD as well as  management strategies and discussion of prescribed medications

## 2018-02-16 NOTE — Patient Instructions (Addendum)
We have sent the following medications to your pharmacy for you to pick up at your convenience:  Pantoprazole, Xifaxan.  Please follow up as needed

## 2018-02-25 ENCOUNTER — Ambulatory Visit: Payer: 59 | Admitting: Allergy and Immunology

## 2018-02-25 ENCOUNTER — Encounter: Payer: Self-pay | Admitting: Allergy and Immunology

## 2018-02-25 VITALS — BP 102/70 | HR 98 | Resp 18 | Ht 71.5 in | Wt 237.2 lb

## 2018-02-25 DIAGNOSIS — E1165 Type 2 diabetes mellitus with hyperglycemia: Secondary | ICD-10-CM | POA: Diagnosis not present

## 2018-02-25 DIAGNOSIS — J4520 Mild intermittent asthma, uncomplicated: Secondary | ICD-10-CM | POA: Diagnosis not present

## 2018-02-25 DIAGNOSIS — J4540 Moderate persistent asthma, uncomplicated: Secondary | ICD-10-CM | POA: Diagnosis not present

## 2018-02-25 DIAGNOSIS — J3089 Other allergic rhinitis: Secondary | ICD-10-CM | POA: Diagnosis not present

## 2018-02-25 DIAGNOSIS — K219 Gastro-esophageal reflux disease without esophagitis: Secondary | ICD-10-CM | POA: Diagnosis not present

## 2018-02-25 DIAGNOSIS — E7800 Pure hypercholesterolemia, unspecified: Secondary | ICD-10-CM | POA: Diagnosis not present

## 2018-02-25 MED ORDER — SYMBICORT 160-4.5 MCG/ACT IN AERO: INHALATION_SPRAY | RESPIRATORY_TRACT | 5 refills | Status: DC

## 2018-02-25 MED ORDER — MONTELUKAST SODIUM 10 MG PO TABS: 10.0000 mg | ORAL_TABLET | Freq: Every day | ORAL | 5 refills | Status: DC

## 2018-02-25 MED ORDER — ALBUTEROL SULFATE HFA 108 (90 BASE) MCG/ACT IN AERS: INHALATION_SPRAY | RESPIRATORY_TRACT | 1 refills | Status: DC

## 2018-02-25 NOTE — Progress Notes (Signed)
Dear Dr. Azucena CecilSwayne,  Thank you for referring Donald RobinsonsGarret S Woodfin to the [facility] [place] [facility] [place] on 02/25/2018.   Below is a summation of this patient's evaluation and recommendations.  Thank you for your referral. I will keep you informed about this patient's response to treatment.   If you have any questions please do not hesitate to contact me.   Sincerely,  Jessica PriestEric J. Reg Bircher, MD Allergy / Immunology [facility]   ______________________________________________________________________    NEW PATIENT NOTE  Referring Provider: Tally JoeSwayne, David, MD Primary Provider: Tally JoeSwayne, David, MD Date of office visit: 02/25/2018    Subjective:   Chief Complaint:  Donald Payne (DOB: 1965/10/09) is a 53 y.o. male who presents to the clinic on 02/25/2018 with a chief complaint of Asthma .     HPI: Brett CanalesSteve presents to this clinic in evaluation of breathing problems.  He apparently has a long history of asthma diagnosed by a pulmonologist in [place] and for several years this was not a particularly big issue and he rarely used a short acting bronchodilator but unfortunately over the course of the past 6 weeks he has developed an episode of feeling as though he cannot expand his chest along with some slight cough and throat clearing and feeling as though there is "stuff in his throat".  He has been using a bronchodilator about 3 times per day and is not really sure that this helps very much.  He did have a chest x-ray recently which apparently identified "bronchitis" and he has been given a Z-Pak and Symbicort.  He might be slightly better with this therapy.  Brett CanalesSteve does have issues with reflux.  He has burping and belching and a "sourness" and he uses Protonix as needed.  He drinks 4 Pepsi's per day and has chocolate about 1 time per day.  In addition, he has nasal congestion and sneezing without any anosmia or ugly nasal  discharge or recurrent sinus infections occurring on a perennial basis with some flare during spring and fall for which he uses Astelin.  He also appears to have sleep apnea treated successfully with CPAP.  He had a nocturnal oximetry study performed around Christmas time which apparently was adequate.  He just went through a 357-month issue of just feeling bad in general along with dizziness and headache and he had lots of evaluation for that issue but fortunately all that had pretty much resolved.  Apparently he was skin tested in the past and found to be allergic to dust mite.  Past Medical History:  Diagnosis Date  . Allergic rhinitis   . Allergy   . Anxiety   . Asthma   . Depression   . Diabetes mellitus (HCC)   . Dyslipidemia   . Fatigue   . GERD (gastroesophageal reflux disease)   . Hemorrhoids   . Hyperlipidemia   . Hypertension   . Hypoxia   . IBS (irritable bowel syndrome)   . Low testosterone   . Prostatitis   . Sleep apnea    uses CPAP  . Sleep apnea with hypersomnolence   . Vitamin D deficiency     Past Surgical History:  Procedure Laterality Date  . WISDOM TOOTH EXTRACTION      Allergies as of 02/25/2018   No Known Allergies     Medication List      albuterol 108 (90 Base) MCG/ACT inhaler Commonly known as:  PROVENTIL HFA;VENTOLIN  HFA Inhale 2 puffs into the lungs 4 (four) times daily as needed.   albuterol (2.5 MG/3ML) 0.083% nebulizer solution Commonly known as:  PROVENTIL Take 2.5 mg by nebulization 4 (four) times daily as needed.   azelastine 0.1 % nasal spray Commonly known as:  ASTELIN Place 1 spray into both nostrils 2 (two) times daily. Use in each nostril as directed   doxepin 10 MG capsule Commonly known as:  SINEQUAN Take 10 mg by mouth at bedtime.   escitalopram 20 MG tablet Commonly known as:  LEXAPRO Take 20 mg by mouth daily.   INVOKAMET XR [phone] MG Tb24 Generic drug:  Canagliflozin-metFORMIN HCl ER Take 1 tablet by mouth  2 (two) times daily.   L-Methylfolate 15 MG Tabs TAKE 1 TABLET BY MOUTH EVERY DAY **NOT COVERED BY INSURANCE**   multivitamin tablet Take 1 tablet by mouth daily.   pantoprazole 40 MG tablet Commonly known as:  PROTONIX Take 1 tablet (40 mg total) by mouth daily.   quinapril 20 MG tablet Commonly known as:  ACCUPRIL Take 20 mg by mouth at bedtime.   rifaximin 550 MG Tabs tablet Commonly known as:  XIFAXAN Take 1 tablet (550 mg total) by mouth 3 (three) times daily.   simvastatin 40 MG tablet Commonly known as:  ZOCOR Take 40 mg by mouth daily.   SYMBICORT 160-4.5 MCG/ACT inhaler Generic drug:  budesonide-formoterol INHALE 2 PUFFS INTO THE LUNGS BID PRN   TRULICITY 0.75 MG/0.5ML Sopn Generic drug:  Dulaglutide Inject 1 Dose into the skin every 7 (seven) days.   zolpidem 10 MG tablet Commonly known as:  AMBIEN TAKE 1 TABLET BY MOUTH AT BEDTIME AS NEEDED (DO NOT TAKE WITHIN 30 MINS OF FOOD)       Review of systems negative except as noted in HPI / PMHx or noted below:  Review of Systems  Constitutional: Negative.   HENT: Negative.   Eyes: Negative.   Respiratory: Negative.   Cardiovascular: Negative.   Gastrointestinal: Negative.   Genitourinary: Negative.   Musculoskeletal: Negative.   Skin: Negative.   Neurological: Negative.   Endo/Heme/Allergies: Negative.   Psychiatric/Behavioral: Negative.     Family History  Problem Relation Age of Onset  . Prostate cancer Paternal Grandfather   . Healthy Other   . Coronary artery disease Other        Deceased age 70  . Diabetes Mother   . Heart disease Father   . Diabetes Paternal Grandmother   . Heart disease Paternal Grandmother   . Colon cancer Neg Hx   . Esophageal cancer Neg Hx   . Rectal cancer Neg Hx   . Stomach cancer Neg Hx     Social History   Socioeconomic History  . Marital status: Married    Spouse name: Not on file  . Number of children: Not on file  . Years of education: Not on file  .  Highest education level: Not on file  Occupational History  . Occupation: Field seismologist  Social Needs  . Financial resource strain: Not on file  . Food insecurity:    Worry: Not on file    Inability: Not on file  . Transportation needs:    Medical: Not on file    Non-medical: Not on file  Tobacco Use  . Smoking status: Never Smoker  . Smokeless tobacco: Never Used  Substance and Sexual Activity  . Alcohol use: Not Currently  . Drug use: No  . Sexual activity: Not on file  Lifestyle  .  Physical activity:    Days per week: Not on file    Minutes per session: Not on file  . Stress: Not on file  Relationships  . Social connections:    Talks on phone: Not on file    Gets together: Not on file    Attends religious service: Not on file    Active member of club or organization: Not on file    Attends meetings of clubs or organizations: Not on file    Relationship status: Not on file  . Intimate partner violence:    Fear of current or ex partner: Not on file    Emotionally abused: Not on file    Physically abused: Not on file    Forced sexual activity: Not on file  Other Topics Concern  . Not on file  Social History Narrative  . Not on file    Environmental and Social history  Lives in a house with a dry environment, no animals located inside the household, carpet in the bedroom, no plastic on the bed, no plastic on the pillow, and employment as a Midwife.  Objective:   Vitals:   02/25/18 1005  BP: 102/70  Pulse: 98  Resp: 18  SpO2: 97%   Height: 5' 11.5" (181.6 cm) Weight: 237 lb 3.2 oz (107.6 kg)  Physical Exam Constitutional:      Appearance: He is not diaphoretic.  HENT:     Head: Normocephalic. No right periorbital erythema or left periorbital erythema.     Right Ear: Tympanic membrane, ear canal and external ear normal.     Left Ear: Tympanic membrane, ear canal and external ear normal.     Nose: Nose normal. No mucosal edema or rhinorrhea.      Mouth/Throat:     Pharynx: No oropharyngeal exudate.  Eyes:     General: Lids are normal.     Conjunctiva/sclera: Conjunctivae normal.     Pupils: Pupils are equal, round, and reactive to light.  Neck:     Thyroid: No thyromegaly.     Trachea: Trachea normal. No tracheal deviation.  Cardiovascular:     Rate and Rhythm: Normal rate and regular rhythm.     Heart sounds: Normal heart sounds, S1 normal and S2 normal. No murmur.  Pulmonary:     Effort: Pulmonary effort is normal. No respiratory distress.     Breath sounds: No stridor. No wheezing or rales.  Chest:     Chest wall: No tenderness.  Abdominal:     General: There is no distension.     Palpations: Abdomen is soft. There is no mass.     Tenderness: There is no abdominal tenderness. There is no guarding or rebound.  Musculoskeletal:        General: No tenderness.  Lymphadenopathy:     Head:     Right side of head: No tonsillar adenopathy.     Left side of head: No tonsillar adenopathy.     Cervical: No cervical adenopathy.  Skin:    Coloration: Skin is not pale.     Findings: No erythema or rash.     Nails: There is no clubbing.   Neurological:     Mental Status: He is alert.     Diagnostics: Allergy skin tests were not performed.   Spirometry was performed and demonstrated an FEV1 of 4.17 @ 100 % of predicted. FEV1/FVC = 0.77.  Following the administration of nebulized albuterol his FEV1 did not improve significantly  Results of  MRI brain obtained 27 December 2017 identified the following:  SINUSES/ORBITS: No fluid levels or advanced mucosal thickening. No mastoid or middle ear effusion. The orbits are normal.  Assessment and Plan:    1. Not well controlled moderate persistent asthma   2. Perennial allergic rhinitis   3. LPRD (laryngopharyngeal reflux disease)     1.  Allergen avoidance measures?  2.  Treat and prevent inflammation:   A.  Continue Symbicort 160-2 inhalations twice a day with  spacer  B.  OTC Nasacort-1 spray each nostril daily  C.  Montelukast 10 mg - 1 tablet daily  3.  Treat and prevent reflux:   A.  Consolidate caffeine and chocolate consumption   B.  Pantoprazole 40 mg tablet in a.m.  C.  OTC ranitidine 150-2 tablets in p.m.  4.  If needed:   A.  Albuterol MDI 2 puffs or nebulized albuterol every 4-6 hours  B. Azelastine -1-2 sprays each nostril 1-2 times a day  5.  Engage in progressive aerobic exercise program  6.  Blood -area two aero allergen profile, CBC with differential  7.  Review recent chest x-ray  8.  Review recent nocturnal oximetry study  9.  Return to clinic in 3 weeks or earlier if problem  Brett CanalesSteve has persistent respiratory tract symptoms probably from a multitude of different issues including atopic induced eosinophilic mediated inflammation of his airway, reflux induced respiratory disease, and cardiovascular deconditioning.  I have made a suggestion about how to address each of these issues as noted above and we will also evaluate his atopic disease with the blood tests noted above.  I will see him back in this clinic in 3 weeks to make a determination about further evaluation and treatment based upon his response to this approach.  Jessica PriestEric J. Luanna Weesner, MD Allergy / Immunology [facility] [place]

## 2018-02-25 NOTE — Patient Instructions (Addendum)
  1.  Allergen avoidance measures?  2.  Treat and prevent inflammation:   A.  Continue Symbicort 160-2 inhalations twice a day with spacer  B.  OTC Nasacort-1 spray each nostril daily  C.  Montelukast 10 mg - 1 tablet daily  3.  Treat and prevent reflux:   A.  Consolidate caffeine and chocolate consumption   B.  Pantoprazole 40 mg tablet in a.m.  C.  OTC ranitidine 150-2 tablets in p.m.  4.  If needed:   A.  Albuterol MDI 2 puffs or nebulized albuterol every 4-6 hours  B. Azelastine -1-2 sprays each nostril 1-2 times a day  5.  Engage in progressive aerobic exercise program  6.  Blood -area two aero allergen profile, CBC with differential  7.  Review recent chest x-ray  8.  Review recent nocturnal oximetry study  9.  Return to clinic in 3 weeks or earlier if problem

## 2018-03-01 ENCOUNTER — Encounter: Payer: Self-pay | Admitting: Allergy and Immunology

## 2018-03-04 DIAGNOSIS — I10 Essential (primary) hypertension: Secondary | ICD-10-CM | POA: Diagnosis not present

## 2018-03-04 DIAGNOSIS — E7800 Pure hypercholesterolemia, unspecified: Secondary | ICD-10-CM | POA: Diagnosis not present

## 2018-03-04 DIAGNOSIS — E1165 Type 2 diabetes mellitus with hyperglycemia: Secondary | ICD-10-CM | POA: Diagnosis not present

## 2018-03-05 DIAGNOSIS — K591 Functional diarrhea: Secondary | ICD-10-CM | POA: Diagnosis not present

## 2018-03-05 DIAGNOSIS — R197 Diarrhea, unspecified: Secondary | ICD-10-CM | POA: Diagnosis not present

## 2018-03-09 ENCOUNTER — Other Ambulatory Visit: Payer: Self-pay

## 2018-03-09 ENCOUNTER — Emergency Department (HOSPITAL_BASED_OUTPATIENT_CLINIC_OR_DEPARTMENT_OTHER)
Admission: EM | Admit: 2018-03-09 | Discharge: 2018-03-09 | Disposition: A | Discharge status: Home / Self Care | Payer: No Typology Code available for payment source | Attending: Emergency Medicine | Admitting: Emergency Medicine

## 2018-03-09 ENCOUNTER — Encounter (HOSPITAL_BASED_OUTPATIENT_CLINIC_OR_DEPARTMENT_OTHER): Payer: Self-pay | Admitting: *Deleted

## 2018-03-09 DIAGNOSIS — H1132 Conjunctival hemorrhage, left eye: Secondary | ICD-10-CM | POA: Insufficient documentation

## 2018-03-09 DIAGNOSIS — J45909 Unspecified asthma, uncomplicated: Secondary | ICD-10-CM | POA: Insufficient documentation

## 2018-03-09 DIAGNOSIS — E119 Type 2 diabetes mellitus without complications: Secondary | ICD-10-CM | POA: Diagnosis not present

## 2018-03-09 DIAGNOSIS — I10 Essential (primary) hypertension: Secondary | ICD-10-CM | POA: Insufficient documentation

## 2018-03-09 DIAGNOSIS — H5789 Other specified disorders of eye and adnexa: Secondary | ICD-10-CM | POA: Diagnosis present

## 2018-03-09 DIAGNOSIS — Z79899 Other long term (current) drug therapy: Secondary | ICD-10-CM | POA: Diagnosis not present

## 2018-03-09 MED ORDER — TETRACAINE HCL 0.5 % OP SOLN
OPHTHALMIC | Status: AC
  Filled 2018-03-09: qty 4

## 2018-03-09 MED ORDER — FLUORESCEIN SODIUM 1 MG OP STRP
ORAL_STRIP | OPHTHALMIC | Status: AC
  Filled 2018-03-09: qty 1
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
## 2018-03-09 NOTE — Discharge Instructions (Addendum)
Follow-up with your eye doctor tomorrow.  Return to the emergency department if symptoms significantly worsen or change in the meantime.

## 2018-03-10 DIAGNOSIS — H43392 Other vitreous opacities, left eye: Secondary | ICD-10-CM | POA: Diagnosis not present

## 2018-04-08 DIAGNOSIS — J4540 Moderate persistent asthma, uncomplicated: Secondary | ICD-10-CM | POA: Diagnosis not present

## 2018-04-08 DIAGNOSIS — J3089 Other allergic rhinitis: Secondary | ICD-10-CM | POA: Diagnosis not present

## 2018-04-12 LAB — ALLERGENS W/TOTAL IGE AREA 2
Alternaria Alternata IgE: <0.1 kU/L
Aspergillus Fumigatus IgE: <0.1 kU/L
Bermuda Grass IgE: <0.1 kU/L
Cat Dander IgE: <0.1 kU/L
Cedar, Mountain IgE: <0.1 kU/L
Cladosporium Herbarum IgE: <0.1 kU/L
Cockroach, German IgE: <0.1 kU/L
Common Silver Birch IgE: <0.1 kU/L
Cottonwood IgE: <0.1 kU/L
D Farinae IgE: <0.1 kU/L
D Pteronyssinus IgE: <0.1 kU/L
Dog Dander IgE: <0.1 kU/L
Elm, American IgE: <0.1 kU/L
IgE (Immunoglobulin E), Serum: 10 IU/mL (ref 6–495)
Johnson Grass IgE: <0.1 kU/L
Maple/Box Elder IgE: <0.1 kU/L
Mouse Urine IgE: <0.1 kU/L
Pigweed, Rough IgE: <0.1 kU/L
Ragweed, Short IgE: <0.1 kU/L
Sheep Sorrel IgE Qn: <0.1 kU/L
Timothy Grass IgE: <0.1 kU/L

## 2018-04-12 LAB — CBC WITH DIFFERENTIAL/PLATELET
Basophils Absolute: 0.1 10*3/uL (ref 0.0–0.2)
Basos: 1 %
EOS (ABSOLUTE): 0.3 10*3/uL (ref 0.0–0.4)
Eos: 4 %
Hematocrit: 47.4 % (ref 37.5–51.0)
Hemoglobin: 16.1 g/dL (ref 13.0–17.7)
Lymphocytes Absolute: 1.1 10*3/uL (ref 0.7–3.1)
Lymphs: 18 %
MCH: 29.4 pg (ref 26.6–33.0)
MCHC: 34 g/dL (ref 31.5–35.7)
MCV: 87 fL (ref 79–97)
Monocytes Absolute: 0.5 10*3/uL (ref 0.1–0.9)
Monocytes: 8 %
NEUTROS ABS: 4.3 10*3/uL (ref 1.4–7.0)
Neutrophils: 69 %
Platelets: 165 10*3/uL (ref 150–450)
RBC: 5.48 x10E6/uL (ref 4.14–5.80)
RDW: 14.2 % (ref 11.6–15.4)
WBC: 6.3 10*3/uL (ref 3.4–10.8)

## 2018-04-22 DIAGNOSIS — Z049 Encounter for examination and observation for unspecified reason: Secondary | ICD-10-CM | POA: Diagnosis not present

## 2018-04-22 DIAGNOSIS — R51 Headache: Secondary | ICD-10-CM | POA: Diagnosis not present

## 2018-04-22 DIAGNOSIS — Z79899 Other long term (current) drug therapy: Secondary | ICD-10-CM | POA: Diagnosis not present

## 2018-05-06 ENCOUNTER — Ambulatory Visit: Payer: 59 | Admitting: Allergy and Immunology

## 2018-05-06 ENCOUNTER — Encounter: Payer: Self-pay | Admitting: Allergy and Immunology

## 2018-05-06 ENCOUNTER — Other Ambulatory Visit: Payer: Self-pay

## 2018-05-06 VITALS — BP 130/68 | HR 96 | Resp 16

## 2018-05-06 DIAGNOSIS — K219 Gastro-esophageal reflux disease without esophagitis: Secondary | ICD-10-CM

## 2018-05-06 DIAGNOSIS — J3089 Other allergic rhinitis: Secondary | ICD-10-CM | POA: Diagnosis not present

## 2018-05-06 DIAGNOSIS — J4540 Moderate persistent asthma, uncomplicated: Secondary | ICD-10-CM

## 2018-05-06 NOTE — Progress Notes (Signed)
[place] [facility] [place] [facility] [place] [facility] [place] - [place]   Follow-up Note  Referring Provider: [name], MD Primary Provider: [name], MD Date of Office Visit: 05/06/2018  Subjective:   Donald Payne (DOB: March 11, 1965) is a 53 y.o. male who [facility] in re-evaluation of the following:  HPI: Donald Payne presents to this clinic in reevaluation of his breathing problems believed secondary to a component of asthma, cardiac deconditioning, LPR, and allergic rhinitis.  His last visit to this clinic was his initial evaluation of 25 February 2018.  He did not follow instructions as directed during his last visit.  He did not use a nasal steroid.  His wife convinced him not to take ranitidine because he was using pantoprazole.  He has been consistently using his Symbicort and montelukast and to some degree has consolidated his caffeine and chocolate consumption.  In some ways he is better.  He thinks his chest is better and he does not have as much chest tightness and feeling as though he can expand his chest.  He thinks that the issue with a slight cough and throat clearing and feeling of stuff in his throat might be somewhat better.  His requirement for bronchodilator is down to about every other day from a high of several times per day.  He still has some issues with reflux intermittently although over the course of the past week or so and he has definitely been having more issues with reflux with lots of throat clearing and a little bit more throat clearing like cough.  He questions if the symptoms are secondary to an event that he had several years ago when he had a staph infection of his upper airway.  He has not been having too much problems with his nose at this point in time.  Allergies as of 05/06/2018   No Known Allergies     Medication List      albuterol (2.5 MG/3ML) 0.083% nebulizer solution Commonly known as:  PROVENTIL Take  2.5 mg by nebulization 4 (four) times daily as needed.   albuterol 108 (90 Base) MCG/ACT inhaler Commonly known as:  PROVENTIL HFA;VENTOLIN HFA Can inhale two puffs every four to six hours as needed for cough or wheeze.   azelastine 0.1 % nasal spray Commonly known as:  ASTELIN Place 1 spray into both nostrils 2 (two) times daily. Use in each nostril as directed   doxepin 10 MG capsule Commonly known as:  SINEQUAN Take 10 mg by mouth at bedtime.   glimepiride 2 MG tablet Commonly known as:  AMARYL TAKE 1 TABLET BY MOUTH EVERY DAY WITH BREAKFAST OR FIRST MAIN MEAL OF THE DAY   Invokana 300 MG Tabs tablet Generic drug:  canagliflozin   L-Methylfolate 15 MG Tabs TAKE 1 TABLET BY MOUTH EVERY DAY **NOT COVERED BY INSURANCE**   montelukast 10 MG tablet Commonly known as:  SINGULAIR Take 1 tablet (10 mg total) by mouth at bedtime.   multivitamin tablet Take 1 tablet by mouth daily.   pantoprazole 40 MG tablet Commonly known as:  PROTONIX Take 1 tablet (40 mg total) by mouth daily.   quinapril 20 MG tablet Commonly known as:  ACCUPRIL Take 20 mg by mouth at bedtime.   sertraline 100 MG tablet Commonly known as:  ZOLOFT Take 100 mg by mouth daily.   simvastatin 40 MG tablet Commonly known as:  ZOCOR Take 40 mg by mouth daily.   Symbicort  160-4.5 MCG/ACT inhaler Generic drug:  budesonide-formoterol Inhale two puffs twice daily with spacer to prevent cough or wheeze.  Rinse, gargle, and spit after use.   topiramate 25 MG tablet Commonly known as:  TOPAMAX Take 75 mg by mouth daily.   zolpidem 10 MG tablet Commonly known as:  AMBIEN TAKE 1 TABLET BY MOUTH AT BEDTIME AS NEEDED (DO NOT TAKE WITHIN 30 MINS OF FOOD)       Past Medical History:  Diagnosis Date  . Allergic rhinitis   . Allergy   . Anxiety   . Asthma   . Depression   . Diabetes mellitus (HCC)   . Dyslipidemia   . Fatigue   . GERD (gastroesophageal reflux disease)   . Hemorrhoids   .  Hyperlipidemia   . Hypertension   . Hypoxia   . IBS (irritable bowel syndrome)   . Low testosterone   . Prostatitis   . Sleep apnea    uses CPAP  . Sleep apnea with hypersomnolence   . Vitamin D deficiency     Past Surgical History:  Procedure Laterality Date  . WISDOM TOOTH EXTRACTION      Review of systems negative except as noted in HPI / PMHx or noted below:  Review of Systems  Constitutional: Negative.   HENT: Negative.   Eyes: Negative.   Respiratory: Negative.   Cardiovascular: Negative.   Gastrointestinal: Negative.   Genitourinary: Negative.   Musculoskeletal: Negative.   Skin: Negative.   Neurological: Negative.   Endo/Heme/Allergies: Negative.   Psychiatric/Behavioral: Negative.      Objective:   Vitals:   05/06/18 1629 05/06/18 1634  BP: (!) 160/80 130/68  Pulse: 96   Resp: 16   SpO2: 95%           Physical Exam Constitutional:      Appearance: He is not diaphoretic.  HENT:     Head: Normocephalic.     Right Ear: Tympanic membrane, ear canal and external ear normal.     Left Ear: Tympanic membrane, ear canal and external ear normal.     Nose: Nose normal. No mucosal edema or rhinorrhea.     Mouth/Throat:     Pharynx: Uvula midline. No oropharyngeal exudate.  Eyes:     Conjunctiva/sclera: Conjunctivae normal.  Neck:     Thyroid: No thyromegaly.     Trachea: Trachea normal. No tracheal tenderness or tracheal deviation.  Cardiovascular:     Rate and Rhythm: Normal rate and regular rhythm.     Heart sounds: Normal heart sounds, S1 normal and S2 normal. No murmur.  Pulmonary:     Effort: No respiratory distress.     Breath sounds: Normal breath sounds. No stridor. No wheezing or rales.  Lymphadenopathy:     Head:     Right side of head: No tonsillar adenopathy.     Left side of head: No tonsillar adenopathy.     Cervical: No cervical adenopathy.  Skin:    Findings: No erythema or rash.     Nails: There is no clubbing.   Neurological:      Mental Status: He is alert.      Diagnostics:    Spirometry was performed and demonstrated an FEV1 of 4.30 at 103 % of predicted.  Results of blood tests obtained 08 April 2018 identifies IgE 10 U/mL, no antigen specific IgE antibodies directed against a panel of aeroallergens, WBC 6.3, absolute eosinophil 300, absolute lymphocyte 1100, hemoglobin 16.1, platelet 165.  Assessment and Plan:  1. Not well controlled moderate persistent asthma   2. Perennial allergic rhinitis   3. LPRD (laryngopharyngeal reflux disease)     1. EVERY DAY - Treat and prevent inflammation:   A.  Continue Symbicort 160-2 inhalations twice a day with spacer  B.  OTC Nasacort-1 spray each nostril daily  C.  Montelukast 10 mg - 1 tablet daily  2.  EVERY DAY - Treat and prevent reflux:   A.  Consolidate caffeine and chocolate consumption   B.  Pantoprazole 40 mg tablet two times per day  C.  OTC ranitidine 150-2 tablets in p.m.  3.  If needed:   A.  Albuterol MDI 2 puffs or nebulized albuterol every 4-6 hours  B. Azelastine -1-2 sprays each nostril 1-2 times a day  4.  Engage in progressive aerobic exercise program  5.  Antibiotics? Call with update in 1 week  6.  Return to clinic in 12 weeks or earlier if problem  I would like Donald Payne to consistently use the plan of action noted above which includes anti-inflammatory agents for his airway and aggressive therapy directed against reflux for at least the next 12 weeks.  Regarding some of his throat clearing and coughing which appear to correlate with worsening reflux symptoms I did tell him that if it does not get better over the course of the next week or so we may need to consider having him undergo further evaluation and treatment and may be some antibiotics for a possible airway infection.  Laurette Schimke, MD Allergy / Immunology [facility]

## 2018-05-06 NOTE — Patient Instructions (Addendum)
  1. EVERY DAY - Treat and prevent inflammation:   A.  Continue Symbicort 160-2 inhalations twice a day with spacer  B.  OTC Nasacort-1 spray each nostril daily  C.  Montelukast 10 mg - 1 tablet daily  2.  EVERY DAY - Treat and prevent reflux:   A.  Consolidate caffeine and chocolate consumption   B.  Pantoprazole 40 mg tablet two times per day  C.  OTC ranitidine 150-2 tablets in p.m.  3.  If needed:   A.  Albuterol MDI 2 puffs or nebulized albuterol every 4-6 hours  B. Azelastine -1-2 sprays each nostril 1-2 times a day  4.  Engage in progressive aerobic exercise program  5.  Antibiotics? Call with update in 1 week  6.  Return to clinic in 12 weeks or earlier if problem

## 2018-05-07 ENCOUNTER — Other Ambulatory Visit: Payer: Self-pay | Admitting: Allergy and Immunology

## 2018-05-07 MED ORDER — RANITIDINE HCL 150 MG PO TABS: ORAL_TABLET | ORAL | 1 refills | Status: DC

## 2018-05-07 MED ORDER — PANTOPRAZOLE SODIUM 40 MG PO TBEC: DELAYED_RELEASE_TABLET | ORAL | 1 refills | Status: DC

## 2018-05-07 NOTE — Telephone Encounter (Signed)
Abdiaziz called in and stated Dr. Lucie Leather increased his RANITIDINE and PANTOPRAZOLE and the new prescriptions weren't called in.  He would like them called in to [facility] in [place].  He states he doesn't have enough to follow Dr. Kathyrn Lass plan.

## 2018-05-07 NOTE — Telephone Encounter (Signed)
Prescriptions sent to requested pharmacy.

## 2018-05-10 ENCOUNTER — Encounter: Payer: Self-pay | Admitting: Allergy and Immunology

## 2018-05-14 ENCOUNTER — Telehealth: Payer: Self-pay | Admitting: Allergy and Immunology

## 2018-05-14 NOTE — Telephone Encounter (Signed)
Attempted to contact Junie Panning, voicemail full.

## 2018-05-14 NOTE — Telephone Encounter (Signed)
Joas called in and stated since Ranitidine is being taken off the market he would like to know what he can replace that with.  Please advise.

## 2018-05-20 ENCOUNTER — Ambulatory Visit: Payer: Self-pay | Admitting: Allergy and Immunology

## 2018-05-24 ENCOUNTER — Other Ambulatory Visit: Payer: Self-pay | Admitting: *Deleted

## 2018-05-24 MED ORDER — FAMOTIDINE 40 MG PO TABS: ORAL_TABLET | ORAL | 5 refills | Status: DC

## 2018-05-24 NOTE — Telephone Encounter (Signed)
Jeri called back. RX sent for Famotidine 40.

## 2018-05-31 ENCOUNTER — Ambulatory Visit (INDEPENDENT_AMBULATORY_CARE_PROVIDER_SITE_OTHER): Payer: 59 | Admitting: Allergy and Immunology

## 2018-05-31 ENCOUNTER — Encounter: Payer: Self-pay | Admitting: Allergy and Immunology

## 2018-05-31 ENCOUNTER — Other Ambulatory Visit: Payer: Self-pay

## 2018-05-31 VITALS — BP 122/70 | HR 90 | Resp 16

## 2018-05-31 DIAGNOSIS — G4734 Idiopathic sleep related nonobstructive alveolar hypoventilation: Secondary | ICD-10-CM

## 2018-05-31 DIAGNOSIS — G4733 Obstructive sleep apnea (adult) (pediatric): Secondary | ICD-10-CM | POA: Diagnosis not present

## 2018-05-31 DIAGNOSIS — J3089 Other allergic rhinitis: Secondary | ICD-10-CM | POA: Diagnosis not present

## 2018-05-31 DIAGNOSIS — K219 Gastro-esophageal reflux disease without esophagitis: Secondary | ICD-10-CM

## 2018-05-31 DIAGNOSIS — J4540 Moderate persistent asthma, uncomplicated: Secondary | ICD-10-CM

## 2018-05-31 NOTE — Patient Instructions (Addendum)
  1. EVERY DAY - Treat and prevent inflammation:   A.  Symbicort 160-2 inhalations twice a day with spacer  B.  OTC Nasacort-1 spray each nostril daily  C.  Montelukast 10 mg - 1 tablet daily  2.  EVERY DAY - Treat and prevent reflux:   A.  Consolidate caffeine and chocolate consumption   B.  Pantoprazole 40 mg tablet two times per day  C.  Famotidine 40 mg in p.m.  D. Finish evening meal prior to 7pm   3.  If needed:   A.  Albuterol MDI 2 puffs or nebulized albuterol every 4-6 hours  B. Azelastine -1-2 sprays each nostril 1-2 times a day  4.  Engage in progressive aerobic exercise program  5.  Add 2 L oxygen to nasal pillows/CPAP (AHP), THEN obtain nocturnal oxygen study while using oxygen (AHP)   6. Obtain 2D / doppler ECHO for dyspnea  7.  Return to clinic in 12 weeks or earlier if problem

## 2018-05-31 NOTE — Progress Notes (Addendum)
[place] [facility] [place] - [place] - [place] - [place]   Follow-up Note  Referring Provider: [name], MD Primary Provider: [name], MD Date of Office Visit: 05/31/2018  Subjective:   Donald Payne (DOB: 1965/09/24) is a 53 y.o. male who [facility] in re-evaluation of the following:  HPI: Brett Canales presents to this clinic in evaluation of breathing problems with a component of asthma, cardiac deconditioning, LPR, allergic rhinitis, and sleep apnea.  His last visit to this clinic was 06 May 2018.  His big complaints today are the fact that he does not feel well around 4 PM in the afternoon and just has lots of fatigue especially in the afternoon.  He still continues to have headaches although they are somewhat better while using Topamax.    As well, he has been having issues with shortness of breath with minimal exertion.  He has apparently had a echocardiogram and a cardiac stress test sometime 10 years ago which did not identify any significant abnormality.  He does not exercise because of his fatigue issue and his shortness of breath.  He has been getting an irritated tongue since using his Symbicort.  He is not using his spacer.  Occasionally he will have reflux if he eats late at nighttime.  For example, if he drinks some orange juice in the evening he will have problems with reflux through the night.  This occurs even though he consistently uses his omeprazole and famotidine.  Allergies as of 05/31/2018   No Known Allergies     Medication List      albuterol (2.5 MG/3ML) 0.083% nebulizer solution Commonly known as:  PROVENTIL Take 2.5 mg by nebulization 4 (four) times daily as needed.   albuterol 108 (90 Base) MCG/ACT inhaler Commonly known as:  VENTOLIN HFA Can inhale two puffs every four to six hours as needed for cough or wheeze.   ALPRAZolam 0.5 MG tablet Commonly known as:  XANAX TAKE 1/2 1 TABELT BY MOUTH  EVERY 6 HOURS AS NEEDED FOR SEVERE ANXIETY   azelastine 0.1 % nasal spray Commonly known as:  ASTELIN Place 1 spray into both nostrils 2 (two) times daily. Use in each nostril as directed   doxepin 10 MG capsule Commonly known as:  SINEQUAN Take 10 mg by mouth at bedtime.   famotidine 40 MG tablet Commonly known as:  PEPCID Take one tablet once each evening   glimepiride 2 MG tablet Commonly known as:  AMARYL TAKE 1 TABLET BY MOUTH EVERY DAY WITH BREAKFAST OR FIRST MAIN MEAL OF THE DAY   Invokana 300 MG Tabs tablet Generic drug:  canagliflozin   L-Methylfolate 15 MG Tabs TAKE 1 TABLET BY MOUTH EVERY DAY **NOT COVERED BY INSURANCE**   montelukast 10 MG tablet Commonly known as:  SINGULAIR Take 1 tablet (10 mg total) by mouth at bedtime.   multivitamin tablet Take 1 tablet by mouth daily.   pantoprazole 40 MG tablet Commonly known as:  PROTONIX Take 2 tablets by mouth daily in the morning   quinapril 20 MG tablet Commonly known as:  ACCUPRIL Take 20 mg by mouth at bedtime.   sertraline 100 MG tablet Commonly known as:  ZOLOFT Take 100 mg by mouth daily.   simvastatin 40 MG tablet Commonly known as:  ZOCOR Take 40 mg by mouth daily.   Symbicort 160-4.5 MCG/ACT inhaler Generic drug:  budesonide-formoterol Inhale two puffs twice daily with spacer to prevent cough or  wheeze.  Rinse, gargle, and spit after use.   topiramate 25 MG tablet Commonly known as:  TOPAMAX Take 75 mg by mouth daily.   VITAMIN C PO Take by mouth daily.   VITAMIN D PO Take by mouth daily.   ZINC PO Take by mouth daily.   zolpidem 10 MG tablet Commonly known as:  AMBIEN TAKE 1 TABLET BY MOUTH AT BEDTIME AS NEEDED (DO NOT TAKE WITHIN 30 MINS OF FOOD)       Past Medical History:  Diagnosis Date  . Allergic rhinitis   . Allergy   . Anxiety   . Asthma   . Depression   . Diabetes mellitus (HCC)   . Dyslipidemia   . Fatigue   . GERD (gastroesophageal reflux disease)   .  Hemorrhoids   . Hyperlipidemia   . Hypertension   . Hypoxia   . IBS (irritable bowel syndrome)   . Low testosterone   . Prostatitis   . Sleep apnea    uses CPAP  . Sleep apnea with hypersomnolence   . Vitamin D deficiency     Past Surgical History:  Procedure Laterality Date  . WISDOM TOOTH EXTRACTION      Review of systems negative except as noted in HPI / PMHx or noted below:  Review of Systems  Constitutional: Negative.   HENT: Negative.   Eyes: Negative.   Respiratory: Negative.   Cardiovascular: Negative.   Gastrointestinal: Negative.   Genitourinary: Negative.   Musculoskeletal: Negative.   Skin: Negative.   Neurological: Negative.   Endo/Heme/Allergies: Negative.   Psychiatric/Behavioral: Negative.      Objective:   Vitals:   05/31/18 0908  BP: 122/70  Pulse: 90  Resp: 16  SpO2: 94%          Physical Exam Constitutional:      Appearance: He is not diaphoretic.  HENT:     Head: Normocephalic.     Right Ear: Tympanic membrane, ear canal and external ear normal.     Left Ear: Tympanic membrane, ear canal and external ear normal.     Nose: Nose normal. No mucosal edema or rhinorrhea.     Mouth/Throat:     Pharynx: Uvula midline. No oropharyngeal exudate.  Eyes:     Conjunctiva/sclera: Conjunctivae normal.  Neck:     Thyroid: No thyromegaly.     Trachea: Trachea normal. No tracheal tenderness or tracheal deviation.  Cardiovascular:     Rate and Rhythm: Normal rate and regular rhythm.     Heart sounds: Normal heart sounds, S1 normal and S2 normal. No murmur.  Pulmonary:     Effort: No respiratory distress.     Breath sounds: Normal breath sounds. No stridor. No wheezing or rales.  Lymphadenopathy:     Head:     Right side of head: No tonsillar adenopathy.     Left side of head: No tonsillar adenopathy.     Cervical: No cervical adenopathy.  Skin:    Findings: No erythema or rash.     Nails: There is no clubbing.   Neurological:      Mental Status: He is alert.     Diagnostics:    Spirometry was performed and demonstrated an FEV1 of 4.29 at 103 % of predicted.  Results of a nocturnal oximetry study obtained 11 February 2018 while using nasal pillow / CPAP identified 22% of time with an oxygen saturation below 88% which calculated out to a total of 56 minutes.  His nadir oxygen  saturation was 75%.  His pulse rate ranged from 45 to 214 bpm.  Assessment and Plan:   1. Not well controlled moderate persistent asthma   2. Perennial allergic rhinitis   3. LPRD (laryngopharyngeal reflux disease)   4. Obstructive sleep apnea syndrome   5. Nocturnal hypoxemia     1. EVERY DAY - Treat and prevent inflammation:   A.  Symbicort 160-2 inhalations twice a day with spacer  B.  OTC Nasacort-1 spray each nostril daily  C.  Montelukast 10 mg - 1 tablet daily  2.  EVERY DAY - Treat and prevent reflux:   A.  Consolidate caffeine and chocolate consumption   B.  Pantoprazole 40 mg tablet two times per day  C.  Famotidine 40 mg in p.m.  D.  Finish evening meal prior to 7pm   3.  If needed:   A.  Albuterol MDI 2 puffs or nebulized albuterol every 4-6 hours  B. Azelastine -1-2 sprays each nostril 1-2 times a day  4.  Engage in progressive aerobic exercise program  5.  To qualify for oxygen re-obtain nocturnal oximetry with CPAP, then add 2 L oxygen to nasal pillows/CPAP (AHP), THEN obtain nocturnal oxygen study while using oxygen (AHP) in the diagnosis of obstructive sleep apnea   6. Obtain 2D / doppler ECHO for dyspnea  7.  Return to clinic in 12 weeks or earlier if problem  Brett CanalesSteve has a collection of various issues that are giving rise to his respiratory tract symptoms including asthma and reflux induced respiratory disease and cardiac deconditioning and apparent nocturnal hypoxemia even in the face of utilizing a CPAP device.  As well, he is developing oral cavity side effects from the use of Symbicort.  I have changed around  his plan as noted above to address these issues.  We will provide him with oxygen at nighttime which hopefully will give rise to better cardiopulmonary function and better exercise tolerance and minimization of his headaches.  I will see him back in this clinic in 12 weeks or earlier if there is a problem and I will contact him with the results of his diagnostic studies once they are available for review.  Laurette SchimkeEric Kenda Kloehn, MD Allergy / Immunology [facility]

## 2018-06-01 ENCOUNTER — Telehealth: Payer: Self-pay

## 2018-06-01 ENCOUNTER — Encounter: Payer: Self-pay | Admitting: Allergy and Immunology

## 2018-06-01 NOTE — Telephone Encounter (Signed)
yes

## 2018-06-01 NOTE — Telephone Encounter (Signed)
Got a call from [facility].  In order for patient to qualify for oxygen in his CPAP he has to have completed an overnight oximetry on the CPAP within 30-days.  It has been over 30-Days since last overnight oximetry.  Would you like to order new overnight oximetry on just CPAP so we can see if he qualifies for the oxygen?  Please advise.

## 2018-06-01 NOTE — Telephone Encounter (Signed)
Patient informed. 

## 2018-06-01 NOTE — Telephone Encounter (Signed)
Tried to call patient but unable to leave message due to full voicemail.  Please inform patient that he is scheduled for ECHO at [facility] on Friday, April 24th needing to check-in at 9:30 am.   NOTE: No PA required- reference # [id number] Form faxed to [facility].

## 2018-06-01 NOTE — Telephone Encounter (Signed)
Called and informed patient about need for overnight oximetry on CPAP prior to getting oxygen.  Told him I will send in order to [facility] and they will contact him.  Patient ok with plan. Form faxed to [facility].

## 2018-06-04 DIAGNOSIS — R0600 Dyspnea, unspecified: Secondary | ICD-10-CM | POA: Diagnosis not present

## 2018-06-04 DIAGNOSIS — G4733 Obstructive sleep apnea (adult) (pediatric): Secondary | ICD-10-CM | POA: Diagnosis not present

## 2018-06-07 ENCOUNTER — Telehealth: Payer: Self-pay

## 2018-06-07 NOTE — Telephone Encounter (Signed)
[facility] called this morning in reference to Donald Payne. Maralyn Sago stated that they need the office note from 4/20 amended to include orders for the overnight oximetry based off of insurance reasons. She stated the overnight oximetry needed to be associated with his obstructive sleep apnea.

## 2018-06-07 NOTE — Telephone Encounter (Signed)
5.  Add 2 L oxygen to nasal pillows/CPAP (AHP), THEN obtain nocturnal oxygen study while using oxygen (AHP)  Number 5 above was in my note. I cant be any clearer about how and why the oxygen is ordered. Has he not received this oxygen yet. If not get it TODAY. Oxygen SHOULD NOT be an item withheld because of paperwork for the insurance company. Have [facility] speak with me if they can not provide oxygen TODAY.

## 2018-06-07 NOTE — Telephone Encounter (Signed)
Called [facility].  Patient did have nocturnal oximetry study on his CPAP on April 24th and does qualify for oxygen with his CPAP. They need a new order for the nocturnal oximetry on CPAP with 2 L oxygen bled into it. ( I will send in a new order).  [facility] does need Dr.Kozlow to amend the last office note to say something to the effect of " nocturnal oximetry on CPAP ordered for diagnosis of obstructive sleep apnea," due to insurance requirements. Please advise when note is amended.

## 2018-06-11 NOTE — Telephone Encounter (Signed)
Form received at the [facility] office. I did complete it then faxed it to [facility].

## 2018-06-28 ENCOUNTER — Encounter: Payer: Self-pay | Admitting: *Deleted

## 2018-06-28 ENCOUNTER — Other Ambulatory Visit: Payer: Self-pay | Admitting: *Deleted

## 2018-06-28 ENCOUNTER — Telehealth: Payer: Self-pay | Admitting: *Deleted

## 2018-06-28 DIAGNOSIS — R0600 Dyspnea, unspecified: Secondary | ICD-10-CM

## 2018-06-28 DIAGNOSIS — I5189 Other ill-defined heart diseases: Secondary | ICD-10-CM

## 2018-06-28 DIAGNOSIS — R931 Abnormal findings on diagnostic imaging of heart and coronary circulation: Secondary | ICD-10-CM

## 2018-06-28 NOTE — Telephone Encounter (Signed)
Per patient request and Dr. Kathyrn Lass verbal O.K. - We have ordered a new CPAP machine through [facility]. I sent the order/notes today. Also, I informed Bhupinder of his Echocardiogram report. Per Dr. Lucie Leather, he has diastolic dysfunction and needs to see cardiology. I have sent a referral to the [facility] in [place].

## 2018-06-30 ENCOUNTER — Other Ambulatory Visit: Payer: Self-pay | Admitting: Family Medicine

## 2018-06-30 DIAGNOSIS — R198 Other specified symptoms and signs involving the digestive system and abdomen: Secondary | ICD-10-CM

## 2018-07-06 ENCOUNTER — Other Ambulatory Visit: Payer: Self-pay

## 2018-07-06 ENCOUNTER — Ambulatory Visit (INDEPENDENT_AMBULATORY_CARE_PROVIDER_SITE_OTHER): Payer: 59 | Admitting: Cardiology

## 2018-07-06 ENCOUNTER — Encounter: Payer: Self-pay | Admitting: Cardiology

## 2018-07-06 ENCOUNTER — Encounter: Payer: Self-pay | Admitting: *Deleted

## 2018-07-06 VITALS — BP 122/70 | HR 103 | Ht 72.0 in | Wt 239.0 lb

## 2018-07-06 DIAGNOSIS — R0602 Shortness of breath: Secondary | ICD-10-CM | POA: Diagnosis not present

## 2018-07-06 DIAGNOSIS — I119 Hypertensive heart disease without heart failure: Secondary | ICD-10-CM

## 2018-07-06 DIAGNOSIS — J45909 Unspecified asthma, uncomplicated: Secondary | ICD-10-CM | POA: Insufficient documentation

## 2018-07-06 DIAGNOSIS — G4730 Sleep apnea, unspecified: Secondary | ICD-10-CM

## 2018-07-06 NOTE — Patient Instructions (Addendum)
Medication Instructions:  Your physician recommends that you continue on your current medications as directed. Please refer to the Current Medication list given to you today.  If you need a refill on your cardiac medications before your next appointment, please call your pharmacy.   Lab work: Your physician recommends that you return for lab work today: ProBNP.   If you have labs (blood work) drawn today and your tests are completely normal, you will receive your results only by: Marland Kitchen MyChart Message (if you have MyChart) OR . A paper copy in the mail If you have any lab test that is abnormal or we need to change your treatment, we will call you to review the results.  Testing/Procedures: You had an EKG today.   Your physician has requested that you have a lexiscan myoview. For further information please visit [email or web address] Please follow instruction sheet, as given.  Follow-Up: At [facility], you and your health needs are our priority.  As part of our continuing mission to provide you with exceptional heart care, we have created designated Provider Care Teams.  These Care Teams include your primary Cardiologist (physician) and Advanced Practice Providers (APPs -  Physician Assistants and Nurse Practitioners) who all work together to provide you with the care you need, when you need it. You will need a virtual follow up appointment in 1 months.     Cardiac Nuclear Scan A cardiac nuclear scan is a test that is done to check the flow of blood to your heart. It is done when you are resting and when you are exercising. The test looks for problems such as:  Not enough blood reaching a portion of the heart.  The heart muscle not working as it should. You may need this test if:  You have heart disease.  You have had lab results that are not normal.  You have had heart surgery or a balloon procedure to open up blocked arteries (angioplasty).  You have chest pain.  You have  shortness of breath. In this test, a special dye (tracer) is put into your bloodstream. The tracer will travel to your heart. A camera will then take pictures of your heart to see how the tracer moves through your heart. This test is usually done at a hospital and takes 2-4 hours. Tell a doctor about:  Any allergies you have.  All medicines you are taking, including vitamins, herbs, eye drops, creams, and over-the-counter medicines.  Any problems you or family members have had with anesthetic medicines.  Any blood disorders you have.  Any surgeries you have had.  Any medical conditions you have.  Whether you are pregnant or may be pregnant. What are the risks? Generally, this is a safe test. However, problems may occur, such as:  Serious chest pain and heart attack. This is only a risk if the stress portion of the test is done.  Rapid heartbeat.  A feeling of warmth in your chest. This feeling usually does not last long.  Allergic reaction to the tracer. What happens before the test?  Ask your doctor about changing or stopping your normal medicines. This is important.  Follow instructions from your doctor about what you cannot eat or drink.  Remove your jewelry on the day of the test. What happens during the test?  An IV tube will be inserted into one of your veins.  Your doctor will give you a small amount of tracer through the IV tube.  You will wait for 20-40  minutes while the tracer moves through your bloodstream.  Your heart will be monitored with an electrocardiogram (ECG).  You will lie down on an exam table.  Pictures of your heart will be taken for about 15-20 minutes.  You may also have a stress test. For this test, one of these things may be done: ? You will be asked to exercise on a treadmill or a stationary bike. ? You will be given medicines that will make your heart work harder. This is done if you are unable to exercise.  When blood flow to your  heart has peaked, a tracer will again be given through the IV tube.  After 20-40 minutes, you will get back on the exam table. More pictures will be taken of your heart.  Depending on the tracer that is used, more pictures may need to be taken 3-4 hours later.  Your IV tube will be removed when the test is over. The test may vary among doctors and hospitals. What happens after the test?  Ask your doctor: ? Whether you can return to your normal schedule, including diet, activities, and medicines. ? Whether you should drink more fluids. This will help to remove the tracer from your body. Drink enough fluid to keep your pee (urine) pale yellow.  Ask your doctor, or the department that is doing the test: ? When will my results be ready? ? How will I get my results? Summary  A cardiac nuclear scan is a test that is done to check the flow of blood to your heart.  Tell your doctor whether you are pregnant or may be pregnant.  Before the test, ask your doctor about changing or stopping your normal medicines. This is important.  Ask your doctor whether you can return to your normal activities. You may be asked to drink more fluids. This information is not intended to replace advice given to you by your health care provider. Make sure you discuss any questions you have with your health care provider. Document Released: 07/13/2017 Document Revised: 07/13/2017 Document Reviewed: 07/13/2017 Elsevier Interactive Patient Education  2019 [employer]

## 2018-07-06 NOTE — Progress Notes (Signed)
Office visit  Date:  07/06/2018   ID:  Donald Payne, DOB 1965-03-10, MRN [id number]   PCP:  Tally JoeSwayne, David, MD  Cardiologist:  Norman HerrlichBrian Munley, MD  Electrophysiologist:  None   Evaluation Performed:  Consultation - Donald Payne was referred by Dr Sharyn LullKoslow for the evaluation of an abnormal echocardiogram..   History of Present Illness:    Donald Payne is a 53 y.o. male with shortness of breath referred by Dr. Illene LabradorKozlo for concerns of heart failure.  He was previously evaluated for heart failure by Dr. Augustina MoodBen Simone in 2013.  At that time echocardiogram showed a normal ejection fraction borderline left ventricular hypertrophy normal right ventricular function and normal pulmonary artery pressures. He has had a previous cardiopulmonary stress test that was interpreted as normal with no cardiopulmonary abnormality his limitations and ability are felt to be due to obesity and deconditioning.  He is seen by Dr. Lucie LeatherKozlow 05/31/2018 and has a history of asthma, deconditioning, allergic rhinitis and sleep apnea.  He had a sleep study performed in January 2020 which showed nocturnal hypoxemia.  His nadir was 75%.  An echocardiogram was performed [facility]/04/2018 with mild concentric LVH normal left ventricular dimensions diastolic function was defined as impaired relaxation which can be up physiologic in his age group and he had no evidence of pulmonary artery hypertension.  The patient does not have symptoms concerning for COVID-19 infection (fever, chills, cough, or new shortness of breath).   He has a long history of obstructive sleep apnea has been on CPAP for over a decade.  Recently he had increasing shortness of breath weakness episodes where he was stop breathing at night unfortunately his device was no longer functioning.  He finds himself very fatigued but yet he can do activities like home renovation.  His shortness of breath is with activity and also positional with the head of the bed  elevated he has no edema orthopnea chest pain palpitation or syncope he is at increased cardiovascular risk with diabetes hypertension hyperlipidemia has never had an ischemia evaluation.  He has no known history of congenital or rheumatic heart disease.  Recently has had lightheadedness and has had a decrease in the dose of his ACE inhibitor.  Past Medical History:  Diagnosis Date  . Allergic rhinitis   . Allergy   . Anxiety   . Asthma   . Depression   . Diabetes mellitus (HCC)   . Dyslipidemia   . Fatigue   . GERD (gastroesophageal reflux disease)   . Hemorrhoids   . Hyperlipidemia   . Hypertension   . Hypoxia   . IBS (irritable bowel syndrome)   . Low testosterone   . Prostatitis   . Sleep apnea    uses CPAP  . Sleep apnea with hypersomnolence   . Vitamin D deficiency    Past Surgical History:  Procedure Laterality Date  . WISDOM TOOTH EXTRACTION       Current Meds  Medication Sig  . albuterol (PROVENTIL HFA;VENTOLIN HFA) 108 (90 Base) MCG/ACT inhaler Can inhale two puffs every four to six hours as needed for cough or wheeze.  Marland Kitchen. albuterol (PROVENTIL) (2.5 MG/3ML) 0.083% nebulizer solution Take 2.5 mg by nebulization 4 (four) times daily as needed.  . ALPRAZolam (XANAX) 0.5 MG tablet TAKE 1/2 1 TABELT BY MOUTH EVERY 6 HOURS AS NEEDED FOR SEVERE ANXIETY  . Ascorbic Acid (VITAMIN C PO) Take by mouth daily.  Marland Kitchen. azelastine (ASTELIN) 0.1 % nasal spray Place  1 spray into both nostrils 2 (two) times daily. Use in each nostril as directed  . doxepin (SINEQUAN) 10 MG capsule Take 10 mg by mouth at bedtime.  . famotidine (PEPCID) 40 MG tablet Take one tablet once each evening  . glimepiride (AMARYL) 2 MG tablet TAKE 1 TABLET BY MOUTH EVERY DAY WITH BREAKFAST OR FIRST MAIN MEAL OF THE DAY  . INVOKANA 300 MG TABS tablet Take 300 mg by mouth daily.   Marland Kitchen L-Methylfolate 15 MG TABS TAKE 1 TABLET BY MOUTH EVERY DAY **NOT COVERED BY INSURANCE**  . Multiple Vitamin (MULTIVITAMIN) tablet Take 1  tablet by mouth daily.  . Multiple Vitamins-Minerals (ZINC PO) Take by mouth daily.  . pantoprazole (PROTONIX) 40 MG tablet Take 2 tablets by mouth daily in the morning  . quinapril (ACCUPRIL) 20 MG tablet Take 20 mg by mouth daily.   . sertraline (ZOLOFT) 100 MG tablet Take 100 mg by mouth daily.  . simvastatin (ZOCOR) 40 MG tablet Take 40 mg by mouth daily.  . SYMBICORT 160-4.5 MCG/ACT inhaler Inhale two puffs twice daily with spacer to prevent cough or wheeze.  Rinse, gargle, and spit after use.  . topiramate (TOPAMAX) 25 MG tablet Take 50 mg by mouth daily.   Marland Kitchen VITAMIN D PO Take by mouth daily.  Marland Kitchen zolpidem (AMBIEN) 10 MG tablet TAKE 1 TABLET BY MOUTH AT BEDTIME AS NEEDED (DO NOT TAKE WITHIN 30 MINS OF FOOD)     Allergies:   Patient has no known allergies.   Social History   Tobacco Use  . Smoking status: Never Smoker  . Smokeless tobacco: Never Used  Substance Use Topics  . Alcohol use: Not Currently  . Drug use: No     Family Hx: The patient's family history includes Coronary artery disease in an other family member; Diabetes in his mother and paternal grandmother; Healthy in an other family member; Heart disease in his father and paternal grandmother; Prostate cancer in his paternal grandfather. There is no history of Colon cancer, Esophageal cancer, Rectal cancer, or Stomach cancer.  ROS:   Please see the history of present illness.    Review of Systems  Constitution: Positive for malaise/fatigue.  HENT: Negative.   Eyes: Negative.   Cardiovascular: Positive for dyspnea on exertion.  Respiratory: Positive for shortness of breath and wheezing.   Endocrine: Negative.   Hematologic/Lymphatic: Negative.   Skin: Negative.   Musculoskeletal: Negative.   Gastrointestinal: Negative.   Genitourinary: Negative.   Neurological: Positive for dizziness.  Psychiatric/Behavioral: Negative.   Allergic/Immunologic: Negative.    All other systems reviewed and are  negative.   Prior CV studies:   The following studies were reviewed today:    Labs/Other Tests and Data Reviewed:    EKG:  An ECG dated today was personally reviewed today and demonstrated:  [facility] and is normal  Recent Labs: 04/08/2018: Hemoglobin 16.1; Platelets 165    Wt Readings from Last 3 Encounters:  07/06/18 239 lb (108.4 kg)  03/09/18 230 lb (104.3 kg)  02/25/18 237 lb 3.2 oz (107.6 kg)     Objective:    Vital Signs:  BP 122/70 (BP Location: Left Arm, Patient Position: Sitting, Cuff Size: Large)   Pulse (!) 103   Ht 6' (1.829 m)   Wt 239 lb (108.4 kg)   SpO2 97%   BMI 32.41 kg/m    VITAL SIGNS:  reviewed GEN:  no acute distress EYES:  sclerae anicteric, EOMI - Extraocular Movements Intact RESPIRATORY:  normal  respiratory effort, symmetric expansion CARDIOVASCULAR:  no peripheral edema SKIN:  no rash, lesions or ulcers. MUSCULOSKELETAL:  no obvious deformities. NEURO:  alert and oriented x 3, no obvious focal deficit PSYCH:  normal affect He does not have the typical body habitus of sleep he does not have a thick neck and he is not severely obese. He has no neck vein distention or edema chest is clear no rales or wheezing Heart is regular S1-S2 is normal there is no gallop or rub ASSESSMENT & PLAN:    1. Shortness of breath multiple etiologies including sleep apnea recently not treated due to device failure.  Differential diagnosis also includes heart failure and anginal equivalent with diabetes hypertension hyperlipidemia.  I reviewed his echocardiogram with him he has impaired relaxation but no mention whether left atrial pressures are elevated clinically does not appear to have heart failure however we will check a proBNP level.  If normal or low excludes the diagnosis.  Evaluate for anginal equivalent ischemia undergo fusion study.  He is in the process of getting a new CPAP machine. 2. Hypertensive heart disease stable he has mild LVH and will continue  treatment with ACE inhibitor requiring a lower dose because of the diuretic effect of his Invokana. 3. Sleep apnea recently worsened awaiting new CPAP machine 4. Obesity associated with sleep apnea and hypertension, he does not have the typical body habitus of sleep apnea and I told him after coronavirus recedes he should consider an ENT evaluation as surgical intervention in some individuals can markedly improve the sleep apnea  COVID-19 Education: The signs and symptoms of COVID-19 were discussed with the patient and how to seek care for testing (follow up with PCP or arrange E-visit).  The importance of social distancing was discussed today.  Time:   Today, I have spent 40 minutes with the patient with telehealth technology discussing the above problems.     Medication Adjustments/Labs and Tests Ordered: Current medicines are reviewed at length with the patient today.  Concerns regarding medicines are outlined above.   Tests Ordered: Orders Placed This Encounter  Procedures  . Pro b natriuretic peptide (BNP)  . MYOCARDIAL PERFUSION IMAGING  . EKG 12-Lead    Medication Changes: No orders of the defined types were placed in this encounter.   Disposition:  Follow up in 8 week(s)  Signed, Norman Herrlich, MD  07/06/2018 4:26 PM    [facility]

## 2018-07-07 LAB — PRO B NATRIURETIC PEPTIDE: NT-Pro BNP: 14 pg/mL (ref 0–121)

## 2018-07-08 ENCOUNTER — Ambulatory Visit: Payer: 59 | Admitting: Cardiology

## 2018-07-14 ENCOUNTER — Telehealth: Payer: Self-pay | Admitting: Allergy and Immunology

## 2018-07-14 ENCOUNTER — Ambulatory Visit
Admission: RE | Admit: 2018-07-14 | Discharge: 2018-07-14 | Disposition: A | Discharge status: Home / Self Care | Payer: 59 | Source: Ambulatory Visit | Attending: Family Medicine | Admitting: Family Medicine

## 2018-07-14 DIAGNOSIS — R198 Other specified symptoms and signs involving the digestive system and abdomen: Secondary | ICD-10-CM

## 2018-07-14 NOTE — Telephone Encounter (Signed)
Needs nocturnal oximetry WITHOUT oxygen.

## 2018-07-14 NOTE — Telephone Encounter (Signed)
Jamail called in and stated now that he has the new CPAP machine, he wants to know if he still needs supplemental oxygen along with his CPAP?  He states it is an extra $70 a month.  [facility], per Jyaire, was going to send paperwork to see if he can have a new oximetry test performed to which we have not received.  Please advise.

## 2018-07-16 NOTE — Telephone Encounter (Signed)
Order sent to [facility].

## 2018-07-21 ENCOUNTER — Telehealth: Payer: Self-pay | Admitting: *Deleted

## 2018-07-21 NOTE — Telephone Encounter (Signed)
Patient given detailed instructions per Myocardial Perfusion Study Information Sheet for the test on  07/27/18. Patient notified to arrive 15 minutes early and that it is imperative to arrive on time for appointment to keep from having the test rescheduled.  If you need to cancel or reschedule your appointment, please call the office within 24 hours of your appointment. . Patient verbalized understanding. Donald Payne    

## 2018-07-27 ENCOUNTER — Other Ambulatory Visit: Payer: Self-pay

## 2018-07-27 ENCOUNTER — Ambulatory Visit (INDEPENDENT_AMBULATORY_CARE_PROVIDER_SITE_OTHER): Payer: 59

## 2018-07-27 DIAGNOSIS — R0602 Shortness of breath: Secondary | ICD-10-CM | POA: Diagnosis not present

## 2018-07-27 MED ORDER — REGADENOSON 0.4 MG/5ML IV SOLN
0.4000 mg | Freq: Once | INTRAVENOUS | Status: AC
  Administered 2018-07-27: 0.4 mg via INTRAVENOUS

## 2018-07-27 MED ORDER — TECHNETIUM TC 99M TETROFOSMIN IV KIT
31.7000 | PACK | Freq: Once | INTRAVENOUS | Status: AC | PRN
  Administered 2018-07-27: 31.7 via INTRAVENOUS

## 2018-07-27 MED ORDER — TECHNETIUM TC 99M TETROFOSMIN IV KIT
11.0000 | PACK | Freq: Once | INTRAVENOUS | Status: AC | PRN
  Administered 2018-07-27: 11 via INTRAVENOUS

## 2018-07-28 LAB — MYOCARDIAL PERFUSION IMAGING
LV dias vol: 80 mL (ref 62–150)
LV sys vol: 26 mL
Peak HR: 106 {beats}/min
Rest HR: 74 {beats}/min
SDS: 2
SRS: 0
SSS: 2
TID: 1.1

## 2018-08-03 NOTE — Progress Notes (Signed)
Virtual Visit via Video Note   This visit type was conducted due to national recommendations for restrictions regarding the COVID-19 Pandemic (e.g. social distancing) in an effort to limit this patient's exposure and mitigate transmission in our community.  Due to his co-morbid illnesses, this patient is at least at moderate risk for complications without adequate follow up.  This format is felt to be most appropriate for this patient at this time.  All issues noted in this document were discussed and addressed.  A limited physical exam was performed with this format.  Please refer to the patient's chart for his consent to telehealth for [facility].   Date:  08/03/2018   ID:  Donald Payne, DOB 1965-08-31, MRN [id number]  Patient Location: Home Provider Location: Office  PCP:  Tally JoeSwayne, David, MD  Cardiologist:  Norman HerrlichBrian Shawne Bulow, MD  Electrophysiologist:  None   Evaluation Performed:  Follow-Up Visit  Chief Complaint:  SOB  History of Present Illness:    Donald Payne is a 53 y.o. male with SOB last seen 07/06/2018.he underwent a MPI re CAD after the visit.   He was previously evaluated for heart failure by Dr. Augustina MoodBen Simone in 2013.  At that time echocardiogram showed a normal ejection fraction borderline left ventricular hypertrophy normal right ventricular function and normal pulmonary artery pressures. He has had a previous cardiopulmonary stress test that was interpreted as normal with no cardiopulmonary abnormality his limitations and ability are felt to be due to obesity and deconditioning.  He is seen by Dr. Lucie LeatherKozlow 05/31/2018 and has a history of asthma, deconditioning, allergic rhinitis and sleep apnea.  He had a sleep study performed in January 2020 which showed nocturnal hypoxemia.  His nadir was 75%.  An echocardi[facility] was performed at [facility] 06/04/2018 with mild concentric LVH normal left ventricular dimensions diastolic function was defined as impaired relaxation  which can be up physiologic in his age group and he had no evidence of pulmonary artery hypertension. He has a long history of obstructive sleep apnea has been on CPAP for over a decade.  Recently he had increasing shortness of breath weakness episodes where he was stop breathing at night unfortunately his device was no longer functioning.  He finds himself very fatigued but yet he can do activities like home renovation.  His shortness of breath is with activity and also positional with the head of the bed elevated he has no edema orthopnea chest pain palpitation or syncope he is at increased cardiovascular risk with diabetes hypertension hyperlipidemia has never had an ischemia evaluation.  He has no known history of congenital or rheumatic heart disease.  Recently has had lightheadedness and has had a decrease in the dose of his ACE inhibitor.   The patient does not have symptoms concerning for COVID-19 infection (fever, chills, cough, or new shortness of breath).   He had read the report of his myocardial perfusion study in my chart and was distressed by the word distal and fixed defect.  When I supplemented to discuss the pattern seen in what he has is a variant of normal with diaphragmatic attenuation due to obesity he has normal myocardial function his ejection fraction of 68% and no evidence of ischemia.  This is associated with a good prognosis and I told him I would not advise him to undergo coronary angiography.  His echocardiogram performed in January 2020 did not show findings of what I would call heart failure and his proBNP level was very low.  In summary his shortness of breath is due to obesity and underlying lung disease asthma and obstructive sleep apnea.  He is not having chest pain.  We discussed that see him back in the office in the future as needed he is comfortable with this approach Past Medical History:  Diagnosis Date   Allergic rhinitis    Allergy    Anxiety    Asthma     Depression    Diabetes mellitus (HCC)    Dyslipidemia    Fatigue    GERD (gastroesophageal reflux disease)    Hemorrhoids    Hyperlipidemia    Hypertension    Hypoxia    IBS (irritable bowel syndrome)    Low testosterone    Prostatitis    Sleep apnea    uses CPAP   Sleep apnea with hypersomnolence    Vitamin D deficiency    Past Surgical History:  Procedure Laterality Date   WISDOM TOOTH EXTRACTION       No outpatient medications have been marked as taking for the 08/04/18 encounter (Appointment) with Baldo DaubMunley, Crosby Bevan J, MD.     Allergies:   Patient has no known allergies.   Social History   Tobacco Use   Smoking status: Never Smoker   Smokeless tobacco: Never Used  Substance Use Topics   Alcohol use: Not Currently   Drug use: No     Family Hx: The patient's family history includes Coronary artery disease in an other family member; Diabetes in his mother and paternal grandmother; Healthy in an other family member; Heart disease in his father and paternal grandmother; Prostate cancer in his paternal grandfather. There is no history of Colon cancer, Esophageal cancer, Rectal cancer, or Stomach cancer.  ROS:   Please see the history of present illness.     All other systems reviewed and are negative.   Prior CV studies:   The following studies were reviewed today:  MPI:   Study Highlights  Nuclear stress EF: 68%.  The left ventricular ejection fraction is hyperdynamic (>65%).  Blood pressure demonstrated a normal response to exercise.  There was no ST segment deviation noted during stress.  Defect 1: There is a small defect of mild severity present in the basal inferoseptal location.  This is a low risk study.  No evidence of ischemia or MI  Normal EF  Diaphragmatic attenuation noted.   Labs/Other Tests and Data Reviewed:    Recent Labs: 04/08/2018: Hemoglobin 16.1; Platelets 165 07/06/2018: NT-Pro BNP 14   Recent Lipid Panel No  results found for: CHOL, TRIG, HDL, CHOLHDL, LDLCALC, LDLDIRECT  Wt Readings from Last 3 Encounters:  07/27/18 238 lb (108 kg)  07/06/18 239 lb (108.4 kg)  03/09/18 230 lb (104.3 kg)     Objective:    Vital Signs:  There were no vitals taken for this visit.   VITAL SIGNS:  reviewed GEN:  no acute distress EYES:  sclerae anicteric, EOMI - Extraocular Movements Intact RESPIRATORY:  normal respiratory effort, symmetric expansion CARDIOVASCULAR:  no peripheral edema SKIN:  no rash, lesions or ulcers. MUSCULOSKELETAL:  no obvious deformities. NEURO:  alert and oriented x 3, no obvious focal deficit PSYCH:  normal affect  ASSESSMENT & PLAN:    1. His shortness of breath is noncardiac related to obesity asthma sleep apnea and his cardiac testing including echocardiogram proBNP myocardial perfusion study are all reassuring.  At this time I do no further cardiac testing I left further follow-up in my office as needed and  was worthwhile that I spent the time with the patient reviewing his test and providing him reassurance.  He told me he felt better at the end of the visit  COVID-19 Education: The signs and symptoms of COVID-19 were discussed with the patient and how to seek care for testing (follow up with PCP or arrange E-visit).  The importance of social distancing was discussed today.  Time:   Today, I have spent 20 minutes with the patient with telehealth technology discussing the above problems.     Medication Adjustments/Labs and Tests Ordered: Current medicines are reviewed at length with the patient today.  Concerns regarding medicines are outlined above.   Tests Ordered: No orders of the defined types were placed in this encounter.   Medication Changes: No orders of the defined types were placed in this encounter.   Follow Up:  Virtual Visit or In Person prn  Signed, Shirlee More, MD  08/03/2018 12:57 PM    [place]

## 2018-08-04 ENCOUNTER — Telehealth (INDEPENDENT_AMBULATORY_CARE_PROVIDER_SITE_OTHER): Payer: 59 | Admitting: Cardiology

## 2018-08-04 ENCOUNTER — Encounter: Payer: Self-pay | Admitting: Cardiology

## 2018-08-04 ENCOUNTER — Other Ambulatory Visit: Payer: Self-pay

## 2018-08-04 VITALS — Ht 72.0 in | Wt 235.0 lb

## 2018-08-04 DIAGNOSIS — I119 Hypertensive heart disease without heart failure: Secondary | ICD-10-CM

## 2018-08-04 DIAGNOSIS — J45909 Unspecified asthma, uncomplicated: Secondary | ICD-10-CM | POA: Diagnosis not present

## 2018-08-04 DIAGNOSIS — G4730 Sleep apnea, unspecified: Secondary | ICD-10-CM

## 2018-08-04 DIAGNOSIS — R0602 Shortness of breath: Secondary | ICD-10-CM

## 2018-08-04 NOTE — Patient Instructions (Signed)
Medication Instructions:  Your physician recommends that you continue on your current medications as directed. Please refer to the Current Medication list given to you today.  If you need a refill on your cardiac medications before your next appointment, please call your pharmacy.   Lab work: None If you have labs (blood work) drawn today and your tests are completely normal, you will receive your results only by: . MyChart Message (if you have MyChart) OR . A paper copy in the mail If you have any lab test that is abnormal or we need to change your treatment, we will call you to review the results.  Testing/Procedures: None  Follow-Up: At [facility], you and your health needs are our priority.  As part of our continuing mission to provide you with exceptional heart care, we have created designated Provider Care Teams.  These Care Teams include your primary Cardiologist (physician) and Advanced Practice Providers (APPs -  Physician Assistants and Nurse Practitioners) who all work together to provide you with the care you need, when you need it. You will need a follow up appointment as needed.  Any Other Special Instructions Will Be Listed Below (If Applicable).    

## 2018-08-29 ENCOUNTER — Other Ambulatory Visit: Payer: Self-pay | Admitting: Allergy and Immunology

## 2018-08-30 ENCOUNTER — Other Ambulatory Visit: Payer: Self-pay | Admitting: *Deleted

## 2018-08-30 MED ORDER — SYMBICORT 160-4.5 MCG/ACT IN AERO: INHALATION_SPRAY | RESPIRATORY_TRACT | 0 refills | Status: DC

## 2018-09-22 ENCOUNTER — Other Ambulatory Visit: Payer: Self-pay | Admitting: Allergy and Immunology

## 2018-12-05 ENCOUNTER — Other Ambulatory Visit: Payer: Self-pay | Admitting: Allergy and Immunology

## 2018-12-24 ENCOUNTER — Encounter: Payer: Self-pay | Admitting: *Deleted

## 2018-12-24 ENCOUNTER — Telehealth: Payer: Self-pay | Admitting: *Deleted

## 2018-12-24 NOTE — Telephone Encounter (Signed)
Voicemail full, unable to leave a message. Need to inform Donald Payne that per Dr. Neldon Mc, he does need to continue using oxygen along with his CPAP machine at night based on his pulse oximetry study results.

## 2018-12-27 NOTE — Telephone Encounter (Signed)
Called and informed patient. He stated that he cant pay $71 a month to rent the machine and is going to look into buying one. However he stated that he will keep in touch with [place] in regards to the machine.

## 2018-12-27 NOTE — Telephone Encounter (Signed)
Please inform patient that he spent 41 minutes out of the night with low oxygen levels below 88% saturation which is not a good situation.

## 2018-12-27 NOTE — Telephone Encounter (Signed)
Called and informed patient of Dr. Bruna Potter recommendation. Patient is asking about the results of the study. He is wondering how low it got and anything else you can inform him about it. Please advise.

## 2018-12-28 NOTE — Telephone Encounter (Signed)
PT called back today. Would like order sent to [facility] on [address], [place].

## 2018-12-29 ENCOUNTER — Other Ambulatory Visit: Payer: Self-pay | Admitting: Allergy and Immunology

## 2018-12-29 ENCOUNTER — Telehealth: Payer: Self-pay | Admitting: Allergy and Immunology

## 2018-12-29 DIAGNOSIS — G4733 Obstructive sleep apnea (adult) (pediatric): Secondary | ICD-10-CM

## 2018-12-29 NOTE — Telephone Encounter (Signed)
Donald Payne from [facility] asked for [place] to send last office notes to fax [phone]

## 2018-12-29 NOTE — Telephone Encounter (Signed)
Per Dr.Kozlow, can send in order for oxygen with CPAP- 2 lpm bled into CPAP.  Also need to set patient up with [facility] office to handle further CPAP needs.  Patient agreeable with plan.  Will send referral as requested and will send in order to [facility] for O2.

## 2018-12-29 NOTE — Telephone Encounter (Signed)
Last office notes faxed to [place].

## 2018-12-29 NOTE — Telephone Encounter (Signed)
Please refer patient to [facility] for sleep apnea.

## 2018-12-30 ENCOUNTER — Encounter: Payer: Self-pay | Admitting: Allergy and Immunology

## 2018-12-30 ENCOUNTER — Other Ambulatory Visit: Payer: Self-pay

## 2018-12-30 ENCOUNTER — Ambulatory Visit: Payer: 59 | Admitting: Allergy and Immunology

## 2018-12-30 VITALS — BP 124/72 | HR 84 | Temp 97.9°F | Resp 16 | Ht 72.4 in | Wt 254.0 lb

## 2018-12-30 DIAGNOSIS — J4540 Moderate persistent asthma, uncomplicated: Secondary | ICD-10-CM

## 2018-12-30 DIAGNOSIS — J3089 Other allergic rhinitis: Secondary | ICD-10-CM | POA: Diagnosis not present

## 2018-12-30 DIAGNOSIS — K219 Gastro-esophageal reflux disease without esophagitis: Secondary | ICD-10-CM | POA: Diagnosis not present

## 2018-12-30 DIAGNOSIS — G4733 Obstructive sleep apnea (adult) (pediatric): Secondary | ICD-10-CM

## 2018-12-30 DIAGNOSIS — G4734 Idiopathic sleep related nonobstructive alveolar hypoventilation: Secondary | ICD-10-CM

## 2018-12-30 NOTE — Telephone Encounter (Signed)
Patient is scheduled for 01/12/2019 with Dr Ander Slade. Patient has been informed.   Thanks

## 2018-12-30 NOTE — Progress Notes (Signed)
- [facility] [place]   Follow-up Note  Referring Provider: Antony Contras, MD Primary Provider: Antony Contras, MD Date of Office Visit: 12/30/2018  Subjective:   Donald Payne (DOB: 12-19-1965) is a 53 y.o. male who returns to the [facility] [place] on 12/30/2018 in re-evaluation of the following:  HPI: Donald Payne returns to this clinic in evaluation of asthma and LPR and allergic rhinitis and sleep apnea and a history of cardiopulmonary deconditioning.  I last saw him in this clinic on 31 May 2018.  Because he demonstrated hypoxemia on a 11 February 2018 nocturnal oximetry study while using CPAP we placed him on 2 L nasal cannula oxygen and his headaches and mid afternoon fatigue improved significantly.  Then because of an expensive issue of using oxygen with his CPAP he discontinued this agent and he once again redeveloped those problems.  A nocturnal oximetry study obtained on 22 December 2018 confirmed that he had hypoxemia at nighttime without oxygen supplementation with a total of 41 minutes of sleep time spent with an oxygen saturation below 88%.  Donald Payne is doing very well from a respiratory standpoint.  He rarely uses a short acting bronchodilator averaging out to 1 time every week.  He really does not exercise to any degree.  He has gained about 20 pounds of weight.  Thus, he still has a little bit of shortness of breath when he exerts himself.  This dyspnea on exertion has been evaluated by cardiology in the past.  He is slowly tapered off his Symbicort yet he still thinks that he continues to do very well.  His nose is doing well while using Nasacort intermittently.  His reflux is under very good control at this point in time with 80 mg of pantoprazole and 40 mg of famotidine every day.  He is very careful about his reflux issue at this point.  Allergies as of 12/30/2018   No Known Allergies     Medication List    albuterol (2.5  MG/3ML) 0.083% nebulizer solution Commonly known as: PROVENTIL Take 2.5 mg by nebulization 4 (four) times daily as needed.   albuterol 108 (90 Base) MCG/ACT inhaler Commonly known as: VENTOLIN HFA Can inhale two puffs every four to six hours as needed for cough or wheeze.   ALPRAZolam 0.5 MG tablet Commonly known as: XANAX TAKE 1/2 1 TABELT BY MOUTH EVERY 6 HOURS AS NEEDED FOR SEVERE ANXIETY   azelastine 0.1 % nasal spray Commonly known as: ASTELIN Place 1 spray into both nostrils 2 (two) times daily. Use in each nostril as directed   doxepin 10 MG capsule Commonly known as: SINEQUAN Take 10 mg by mouth at bedtime.   famotidine 40 MG tablet Commonly known as: PEPCID Take one tablet once each evening   glimepiride 2 MG tablet Commonly known as: AMARYL TAKE 1 TABLET BY MOUTH EVERY DAY WITH BREAKFAST OR FIRST MAIN MEAL OF THE DAY   Jardiance 25 MG Tabs tablet Generic drug: empagliflozin Take 25 mg by mouth daily.   L-Methylfolate 15 MG Tabs TAKE 1 TABLET BY MOUTH EVERY DAY **NOT COVERED BY INSURANCE**   Melatonin 10 MG Tabs Take by mouth at bedtime.   multivitamin tablet Take 1 tablet by mouth daily.   pantoprazole 40 MG tablet Commonly known as: PROTONIX Take 2 tablets by mouth daily in the morning   quinapril 20 MG tablet Commonly known as: ACCUPRIL Take 10 mg by mouth daily.   sertraline  100 MG tablet Commonly known as: ZOLOFT Take 100 mg by mouth daily.   simvastatin 40 MG tablet Commonly known as: ZOCOR Take 40 mg by mouth daily.   Symbicort 160-4.5 MCG/ACT inhaler Generic drug: budesonide-formoterol PLEASE SEE ATTACHED FOR DETAILED DIRECTIONS   Toujeo Max SoloStar 300 UNIT/ML Sopn Generic drug: Insulin Glargine (2 Unit Dial) SMARTSIG:15 Unit(s) SUB-Q Daily   TURMERIC CURCUMIN PO Take by mouth.   VITAMIN C PO Take by mouth daily.   VITAMIN D PO Take by mouth daily.   ZINC PO Take by mouth daily.   zolpidem 10 MG tablet Commonly known as:  AMBIEN TAKE 1 TABLET BY MOUTH AT BEDTIME AS NEEDED (DO NOT TAKE WITHIN 30  MINS OF FOOD)       Past Medical History:  Diagnosis Date  . Allergic rhinitis   . Allergy   . Anxiety   . Asthma   . Depression   . Diabetes mellitus (HCC)   . Dyslipidemia   . Fatigue   . GERD (gastroesophageal reflux disease)   . Hemorrhoids   . Hyperlipidemia   . Hypertension   . Hypoxia   . IBS (irritable bowel syndrome)   . Low testosterone   . Prostatitis   . Sleep apnea    uses CPAP  . Sleep apnea with hypersomnolence   . Vitamin D deficiency     Past Surgical History:  Procedure Laterality Date  . WISDOM TOOTH EXTRACTION      Review of systems negative except as noted in HPI / PMHx or noted below:  Review of Systems  Constitutional: Negative.   HENT: Negative.   Eyes: Negative.   Respiratory: Negative.   Cardiovascular: Negative.   Gastrointestinal: Negative.   Genitourinary: Negative.   Musculoskeletal: Negative.   Skin: Negative.   Neurological: Negative.   Endo/Heme/Allergies: Negative.   Psychiatric/Behavioral: Negative.      Objective:   There were no vitals filed for this visit.        Physical Exam Constitutional:      Appearance: He is not diaphoretic.  HENT:     Head: Normocephalic.     Right Ear: Tympanic membrane, ear canal and external ear normal.     Left Ear: Tympanic membrane, ear canal and external ear normal.     Nose: Nose normal. No mucosal edema or rhinorrhea.     Mouth/Throat:     Pharynx: Uvula midline. No oropharyngeal exudate.  Eyes:     Conjunctiva/sclera: Conjunctivae normal.  Neck:     Thyroid: No thyromegaly.     Trachea: Trachea normal. No tracheal tenderness or tracheal deviation.  Cardiovascular:     Rate and Rhythm: Normal rate and regular rhythm.     Heart sounds: Normal heart sounds, S1 normal and S2 normal. No murmur.  Pulmonary:     Effort: No respiratory distress.     Breath sounds: Normal breath sounds. No stridor. No  wheezing or rales.  Lymphadenopathy:     Head:     Right side of head: No tonsillar adenopathy.     Left side of head: No tonsillar adenopathy.     Cervical: No cervical adenopathy.  Skin:    Findings: No erythema or rash.     Nails: There is no clubbing.   Neurological:     Mental Status: He is alert.     Diagnostics:    Spirometry was performed and demonstrated an FEV1 of 4.26 at 102 % of predicted.   Assessment and Plan:  1. Asthma, moderate persistent, well-controlled   2. Perennial allergic rhinitis   3. LPRD (laryngopharyngeal reflux disease)   4. Obstructive sleep apnea syndrome   5. Nocturnal hypoxemia     1. EVERY DAY - Treat and prevent inflammation:   A.  OTC Nasacort-1 spray each nostril daily  2.  EVERY DAY - Treat and prevent reflux:   A.  Consolidate caffeine and chocolate consumption   B.  Pantoprazole 40 mg tablet two times per day  C.  Famotidine 40 mg in p.m.  D.  Finish evening meal prior to 7pm   3.  If needed:   A.  Albuterol MDI 2 puffs or nebulized albuterol every 4-6 hours  B. Azelastine -1-2 sprays each nostril 1-2 times a day  4. Can restart Symbicort 160 - 2 inhalations 2 times per day WITH SPACER during increased asthma symptoms  5.  Engage in progressive aerobic exercise program  6.  Add 2 L oxygen to nasal pillows/CPAP (AHP) and check nocturnal oximetry study   7. Obtain flu vaccine (and Covid vaccine)  8. Return to clinic in 6 months or earlier if problem  Brett Canales is doing well from a respiratory standpoint and his reflux is under very good control but he obviously does not have his sleep apnea and nocturnal hypoxemia under good control.  We will try to get that straightened out by bleeding oxygen in his CPAP and rechecking his nocturnal oximetry study with oxygen supplementation.  Laurette Schimke, MD Allergy / Immunology [facility]

## 2018-12-30 NOTE — Patient Instructions (Addendum)
  1. EVERY DAY - Treat and prevent inflammation:   A.  OTC Nasacort-1 spray each nostril daily  2.  EVERY DAY - Treat and prevent reflux:   A.  Consolidate caffeine and chocolate consumption   B.  Pantoprazole 40 mg tablet two times per day  C.  Famotidine 40 mg in p.m.  D.  Finish evening meal prior to 7pm   3.  If needed:   A.  Albuterol MDI 2 puffs or nebulized albuterol every 4-6 hours  B. Azelastine -1-2 sprays each nostril 1-2 times a day  4. Can restart Symbicort 160 - 2 inhalations 2 times per day WITH SPACER during increased asthma symptoms  5.  Engage in progressive aerobic exercise program  6.  Add 2 L oxygen to nasal pillows/CPAP (AHP) and check nocturnal oximetry study   7. Obtain flu vaccine (and Covid vaccine)  8. Return to clinic in 6 months or earlier if problem

## 2019-01-03 ENCOUNTER — Encounter: Payer: Self-pay | Admitting: Allergy and Immunology

## 2019-01-03 ENCOUNTER — Telehealth: Payer: Self-pay

## 2019-01-03 NOTE — Telephone Encounter (Signed)
Tried to call patient but unable to leave a voicemail message. 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  I have faxed order form to [facility] and they should be contacting him soon to set up oxygen for his CPAP and set up for the overnight oximetry study.

## 2019-01-05 NOTE — Telephone Encounter (Signed)
Talked with patient and informed him of additional overnight oximetry study while on CPAP with oxygen.

## 2019-01-08 ENCOUNTER — Other Ambulatory Visit: Payer: Self-pay | Admitting: Allergy and Immunology

## 2019-01-12 ENCOUNTER — Encounter: Payer: Self-pay | Admitting: Pulmonary Disease

## 2019-01-12 ENCOUNTER — Ambulatory Visit: Payer: 59 | Admitting: Pulmonary Disease

## 2019-01-12 ENCOUNTER — Other Ambulatory Visit: Payer: Self-pay

## 2019-01-12 VITALS — BP 122/82 | HR 85 | Ht 72.4 in | Wt 254.2 lb

## 2019-01-12 DIAGNOSIS — G4730 Sleep apnea, unspecified: Secondary | ICD-10-CM | POA: Diagnosis not present

## 2019-01-12 NOTE — Patient Instructions (Signed)
Obtain overnight oximetry with 2 L oxygen supplementation into CPAP device  Continue using CPAP on a regular basis  Continue to work on weight loss    I will see you back in about 6 months  Your recent spirometry does suggest normal lung function Your echocardiogram-ultrasound of the heart did not reveal significant heart disease  The low oxygen levels likely related to shallow breathing which may improve with weight loss  Call with significant concerns

## 2019-01-12 NOTE — Progress Notes (Signed)
Subjective:    Patient ID: Donald Payne, male    DOB: 14-Oct-1965, 53 y.o.   MRN: [id number]  Patient being seen for history of obstructive sleep apnea  Overnight oximetry revealing mild desaturations  Use of oxygen supplementation in the past did help with headaches  Sleep apnea diagnosed about 2010 Uses CPAP nightly  He does have a history of hypertension, asthma, diabetes, chronic headaches  Usually goes to bed about 11 PM takes about 20 minutes to fall asleep Wakes up finally about 7 AM  Sleep is restorative  Worked in the fire department in the past No underlying lung disease Had cardiac work-up in the past with normal ejection fraction, some diastolic dysfunction  Past Medical History:  Diagnosis Date  . Allergic rhinitis   . Allergy   . Anxiety   . Asthma   . Depression   . Diabetes mellitus ([place])   . Dyslipidemia   . Fatigue   . GERD (gastroesophageal reflux disease)   . Hemorrhoids   . Hyperlipidemia   . Hypertension   . Hypoxia   . IBS (irritable bowel syndrome)   . Low testosterone   . Prostatitis   . Sleep apnea    uses CPAP  . Sleep apnea with hypersomnolence   . Vitamin D deficiency    Social History   Socioeconomic History  . Marital status: Married    Spouse name: Not on file  . Number of children: Not on file  . Years of education: Not on file  . Highest education level: Not on file  Occupational History  . Occupation: Environmental manager  Social Needs  . Financial resource strain: Not on file  . Food insecurity    Worry: Not on file    Inability: Not on file  . Transportation needs    Medical: Not on file    Non-medical: Not on file  Tobacco Use  . Smoking status: Never Smoker  . Smokeless tobacco: Never Used  Substance and Sexual Activity  . Alcohol use: Not Currently  . Drug use: No  . Sexual activity: Not on file  Lifestyle  . Physical activity    Days per week: Not on file    Minutes per session: Not on file  . Stress:  Not on file  Relationships  . Social Herbalist on phone: Not on file    Gets together: Not on file    Attends religious service: Not on file    Active member of club or organization: Not on file    Attends meetings of clubs or organizations: Not on file    Relationship status: Not on file  . Intimate partner violence    Fear of current or ex partner: Not on file    Emotionally abused: Not on file    Physically abused: Not on file    Forced sexual activity: Not on file  Other Topics Concern  . Not on file  Social History Narrative  . Not on file   Family History  Problem Relation Age of Onset  . Prostate cancer Paternal Grandfather   . Healthy Other   . Coronary artery disease Other        Deceased age 72  . Diabetes Mother   . Heart disease Father   . Diabetes Paternal Grandmother   . Heart disease Paternal Grandmother   . Colon cancer Neg Hx   . Esophageal cancer Neg Hx   . Rectal cancer Neg Hx   .  Stomach cancer Neg Hx     Review of Systems  Constitutional: Negative for fever and unexpected weight change.  HENT: Negative for congestion, dental problem, ear pain, nosebleeds, postnasal drip, rhinorrhea, sinus pressure, sneezing, sore throat and trouble swallowing.   Eyes: Negative for redness and itching.  Respiratory: Positive for shortness of breath. Negative for cough, chest tightness and wheezing.   Cardiovascular: Negative for palpitations and leg swelling.  Gastrointestinal: Negative for nausea and vomiting.  Genitourinary: Negative for dysuria.  Musculoskeletal: Negative for joint swelling.  Skin: Negative for rash.  Allergic/Immunologic: Negative.  Negative for environmental allergies, food allergies and immunocompromised state.  Neurological: Negative for headaches.  Hematological: Does not bruise/bleed easily.  Psychiatric/Behavioral: Negative for dysphoric mood. The patient is nervous/anxious.   All other systems reviewed and are negative.      Objective:   Physical Exam Constitutional:      Appearance: He is obese.  HENT:     Head: Normocephalic and atraumatic.     Nose: No congestion or rhinorrhea.     Mouth/Throat:     Pharynx: No oropharyngeal exudate or posterior oropharyngeal erythema.  Neck:     Musculoskeletal: Normal range of motion.  Cardiovascular:     Rate and Rhythm: Normal rate and regular rhythm.     Pulses: Normal pulses.     Heart sounds: Normal heart sounds. No murmur. No friction rub.  Pulmonary:     Effort: Pulmonary effort is normal. No respiratory distress.     Breath sounds: Normal breath sounds. No stridor. No wheezing or rhonchi.  Skin:    General: Skin is warm and dry.  Neurological:     Mental Status: He is alert.  Psychiatric:        Mood and Affect: Mood normal.    Vitals:   01/12/19 1434  BP: 122/82  Pulse: 85  SpO2: 94%   Results of the Epworth flowsheet 01/12/2019  Sitting and reading 2  Watching TV 2  Sitting, inactive in a public place (e.g. a theatre or a meeting) 2  As a passenger in a car for an hour without a break 1  Lying down to rest in the afternoon when circumstances permit 0  Sitting and talking to someone 0  Sitting quietly after a lunch without alcohol 1  In a car, while stopped for a few minutes in traffic 0  Total score 8   Do not have download from his machine at present  Recent echocardiogram noted for normal ejection fraction, diastolic dysfunction 4/24 2020   Spirometry from November 2020, April 2020 within normal limits  Overnight oximetry 12/22/2018, 06/07/2018 reveals desaturations Assessment & Plan:  .  Nocturnal desaturations -No underlying lung disease -History of obstructive sleep apnea, adequately treated with CPAP therapy  Obstructive sleep apnea -Good symptom control -Need to follow-up on compliance and effectiveness  Plan: Obtain a download from his machine  Overnight oximetry with oxygen supplementation in place via CPAP  We will  see him in follow-up in 6 months  Encouraged to work on weight loss  Encouraged to call if any significant concerns

## 2019-02-07 IMAGING — MR MR MRA HEAD W/O CM
10 of 11 series · 35 of 48 positions shown · non-contrast
Comparison: None.

CLINICAL DATA: Frontal headaches for 1 month.

EXAM:
MRI HEAD WITHOUT CONTRAST
MRA HEAD WITHOUT CONTRAST
TECHNIQUE: Multiplanar, multiecho pulse sequences of the brain and surrounding
structures were obtained without intravenous contrast. Angiographic
images of the head were obtained using MRA technique without
contrast.

[Series 8: T1 · sagittal · 4.0mm · 0.75mm/px · 1 of 29 slices shown (1 of 2)]
[im 1/29]
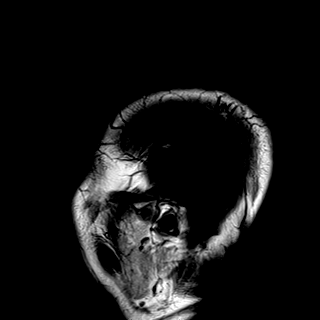

[Series 9: [id number]-slab · axial · 0.6mm · 0.26mm/px · z∈[-58,-20]mm · 4 of 168 slices shown]
[im 1/168]
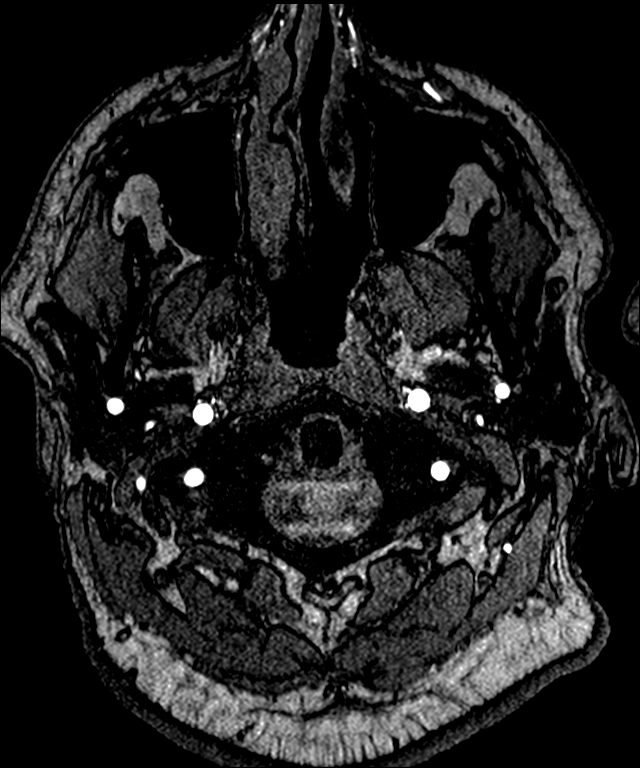
[im 34/168]
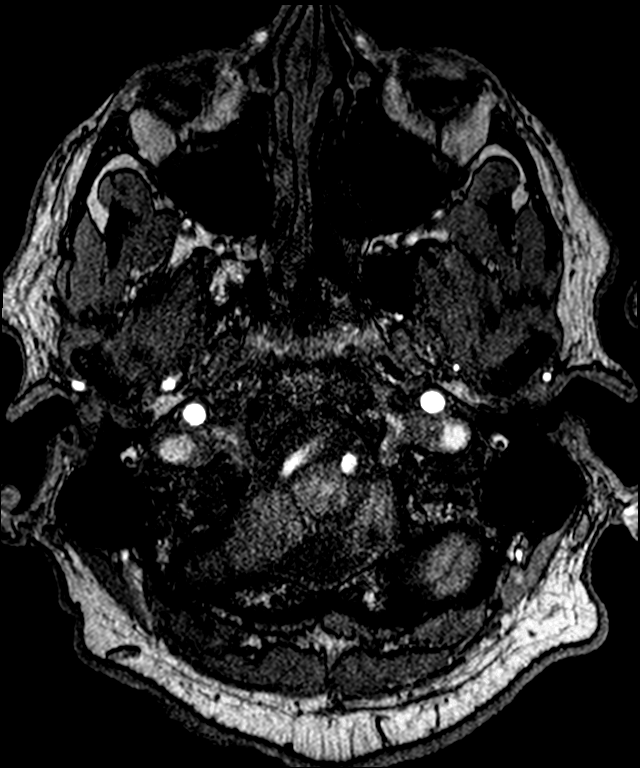
[im 51/168]
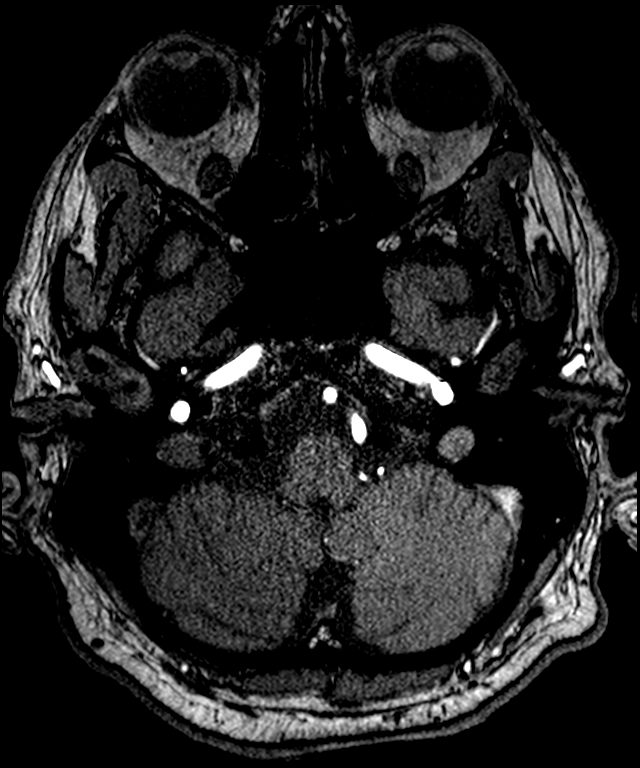
[im 67/168]
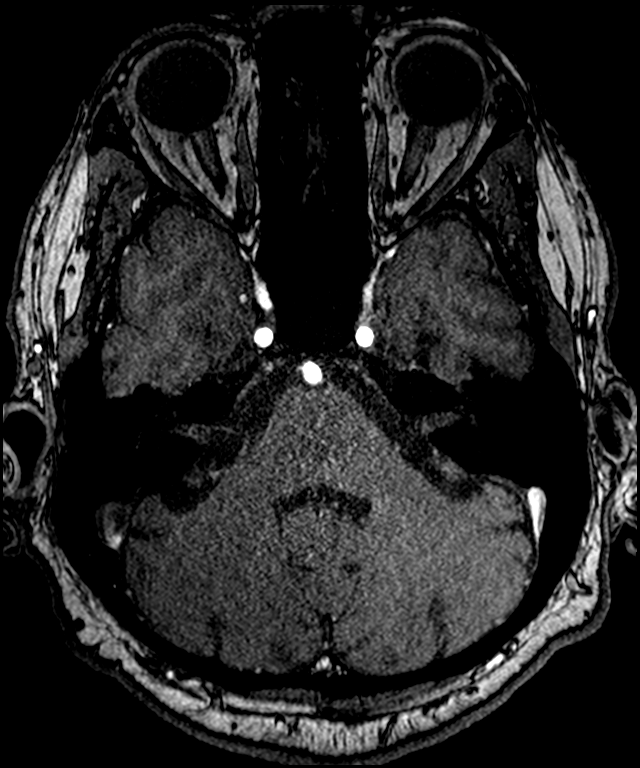

[Series 14: DWI · axial · 3.0mm · 1.44mm/px · z∈[-73,+75]mm · 6 of 92 slices shown (1 of 4)]
[im 1/92]
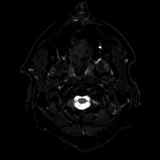
[im 19/92]
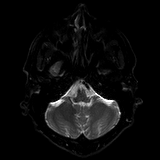
[im 37/92]
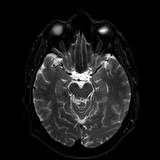
[im 55/92]
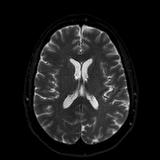
[im 73/92]
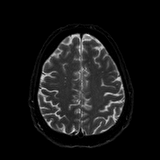
[im 92/92]
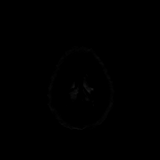

[Series 15: DWI · axial · 3.0mm · 1.44mm/px · z∈[-73,+75]mm · 3 of 46 slices shown (2 of 4)]
[im 1/46]
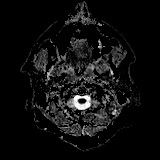
[im 23/46]
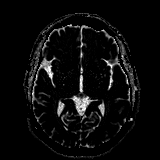
[im 46/46]
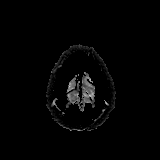

[Series 16: DWI · coronal · 5.0mm · 1.44mm/px · 4 of 64 slices shown (3 of 4)]
[im 1/64]
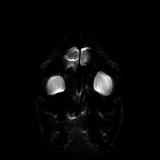
[im 22/64]
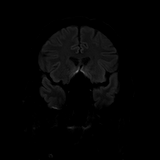
[im 43/64]
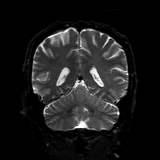
[im 64/64]
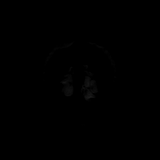

[Series 17: DWI · coronal · 5.0mm · 1.44mm/px · 2 of 32 slices shown (4 of 4)]
[im 1/32]
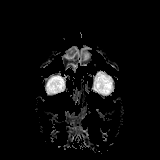
[im 32/32]
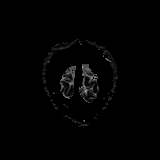

[Series 20: T2 · axial · 4.0mm · 0.36mm/px · z∈[-72,+74]mm · 2 of 29 slices shown (1 of 2)]
[im 1/29]
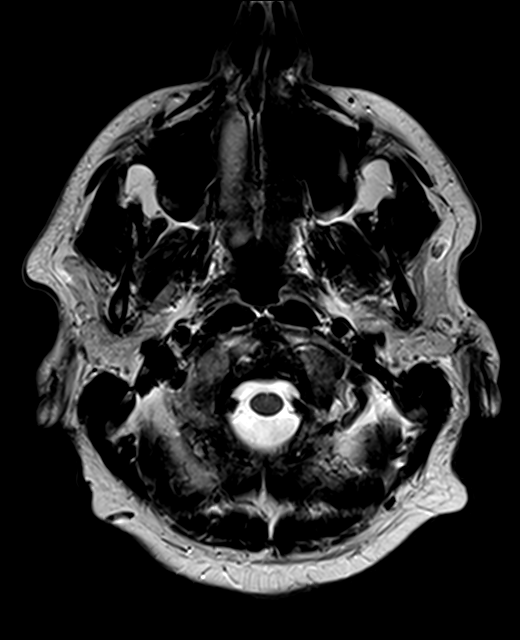
[im 29/29]
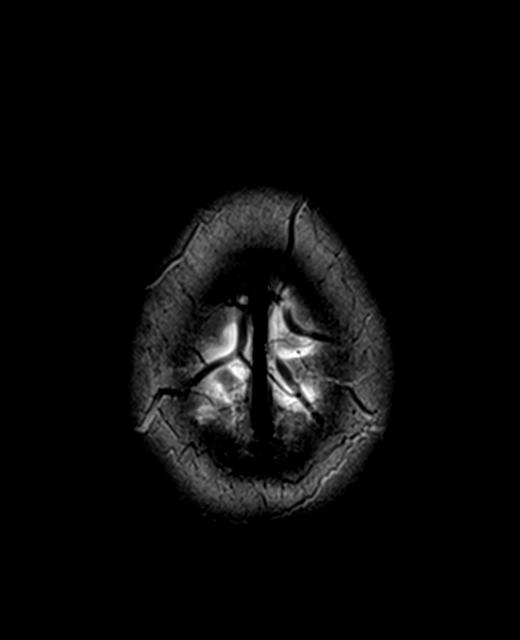

[Series 21: FLAIR · axial · 3.0mm · 0.72mm/px · z∈[-75,+77]mm · 3 of 40 slices shown]
[im 1/40]
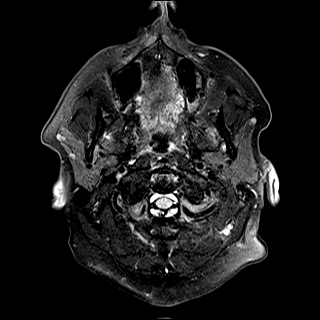
[im 20/40]
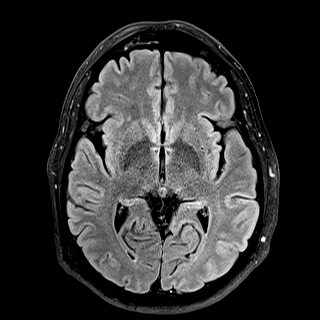
[im 40/40]
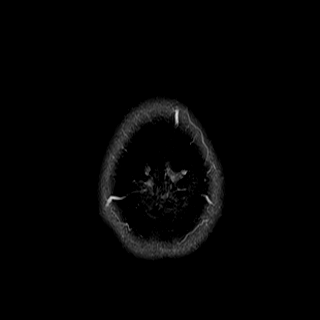

[Series 22: T1 · axial · 1.0mm · 0.90mm/px · z∈[-76,+83]mm · 8 of 160 slices shown (2 of 2)]
[im 1/160]
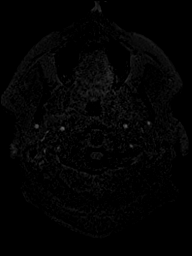
[im 32/160]
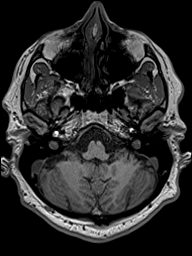
[im 48/160]
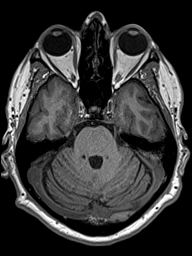
[im 64/160]
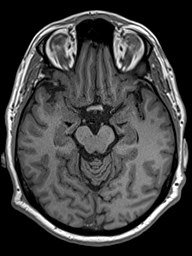
[im 96/160]
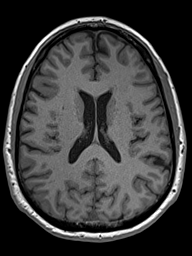
[im 112/160]
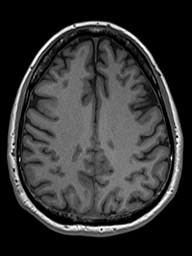
[im 128/160]
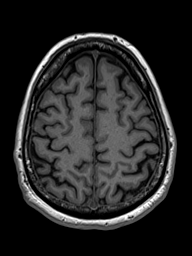
[im 160/160]
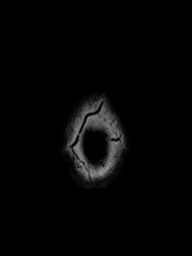

[Series 23: T2 · coronal · 4.5mm · 0.36mm/px · 2 of 30 slices shown (2 of 2)]
[im 1/30]
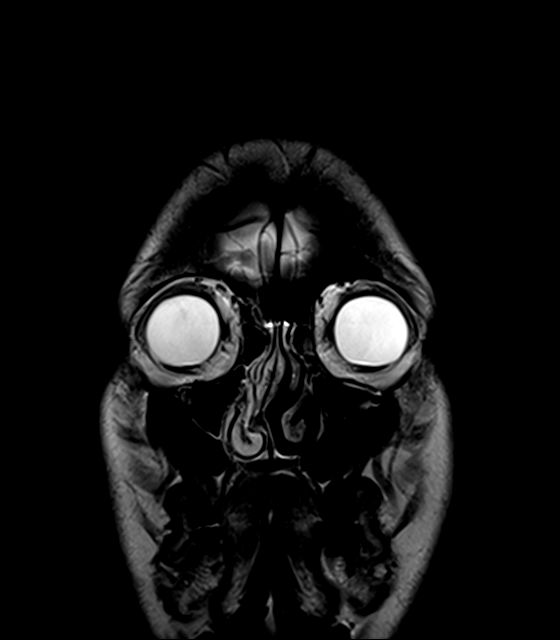
[im 30/30]
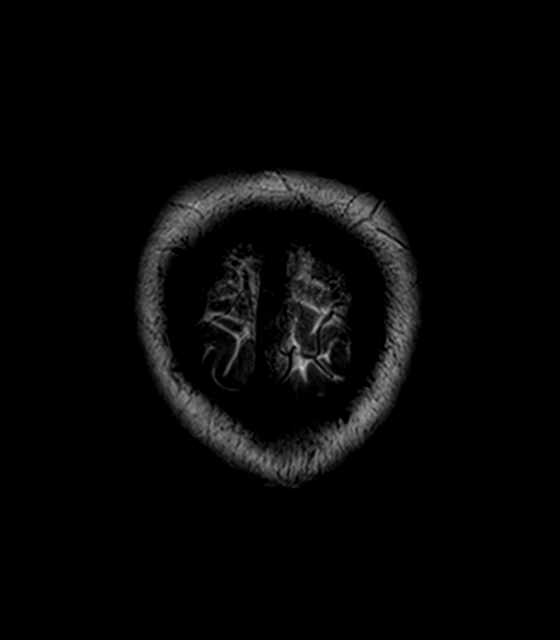

[35 of 48 positions shown; findings below may reference images not displayed]

FINDINGS: MRI HEAD FINDINGS

BRAIN: There is no acute infarct, acute hemorrhage or mass effect.
The midline structures are normal. There are no old infarcts.
Scattered foci of hyperintense T2-weighted signal within the white
matter in a nonspecific pattern that may be seen in the context of
migraine headaches, but is also seen in asymptomatic patients. The
CSF spaces are normal for age, with no hydrocephalus.
Susceptibility-sensitive sequences show no chronic microhemorrhage
or superficial siderosis.

SKULL AND UPPER CERVICAL SPINE: The visualized skull base,
calvarium, upper cervical spine and extracranial soft tissues are
normal.

SINUSES/ORBITS: No fluid levels or advanced mucosal thickening. No
mastoid or middle ear effusion. The orbits are normal.

MRA HEAD FINDINGS

Intracranial internal carotid arteries: Normal.

Anterior cerebral arteries: Normal.

Middle cerebral arteries: Normal.

Posterior communicating arteries: Absent bilaterally.

Posterior cerebral arteries: Normal.

Basilar artery: Normal.

Vertebral arteries: Left dominant. Normal.

Superior cerebellar arteries: Normal.

Inferior cerebellar arteries: Normal.
IMPRESSION: 1. Nonspecific scattered foci of white matter hyperintensity, few in
number. Though this may be seen in the setting of migraine headaches
or vasculopathy such as early chronic small vessel disease, it is
also seen in asymptomatic patients of this age group.
2. Normal intracranial MRA.

## 2019-03-02 ENCOUNTER — Telehealth: Payer: Self-pay | Admitting: Pulmonary Disease

## 2019-03-02 NOTE — Telephone Encounter (Signed)
Inform patient  Overnight oximetry reviewed Adequate oxygen supplementation with 2 L into CPAP  Continue current treatment, no changes needed

## 2019-03-04 NOTE — Telephone Encounter (Signed)
Called and spoke with patient. Dr.Olalere's results and recommendations given.  Nothing further at this time.

## 2019-06-18 ENCOUNTER — Other Ambulatory Visit: Payer: Self-pay | Admitting: Allergy and Immunology

## 2019-07-19 ENCOUNTER — Other Ambulatory Visit: Payer: Self-pay | Admitting: Allergy and Immunology

## 2019-07-23 ENCOUNTER — Other Ambulatory Visit: Payer: Self-pay | Admitting: Allergy and Immunology

## 2019-07-25 NOTE — Telephone Encounter (Signed)
Courtesy refill  

## 2019-08-19 ENCOUNTER — Other Ambulatory Visit: Payer: Self-pay | Admitting: Allergy and Immunology

## 2019-08-25 IMAGING — US ULTRASOUND ABDOMEN COMPLETE
1 series · 14 of 25 positions shown · non-contrast
Comparison: CT 07/18/2015

CLINICAL DATA: Early satiety, increased abdominal girth

EXAM:
ABDOMEN ULTRASOUND COMPLETE

[Series 1: ultrasound abdomen complete · 0.19mm/px · 14 of 91 slices shown]
[im 1/91]
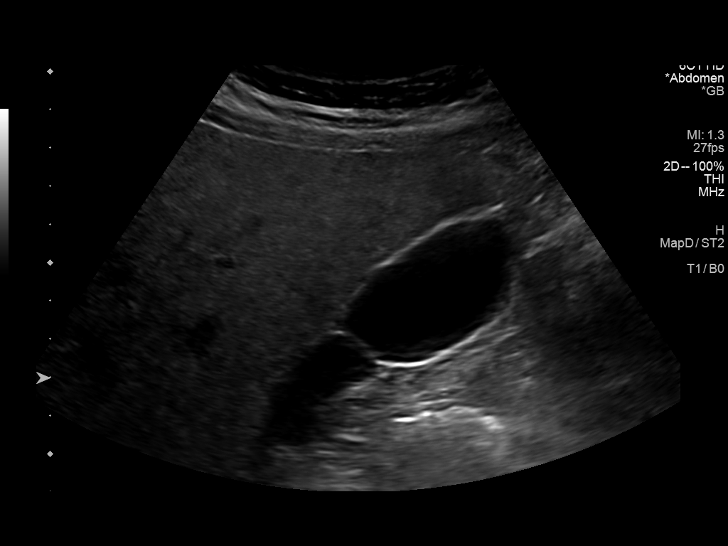
[im 8/91]
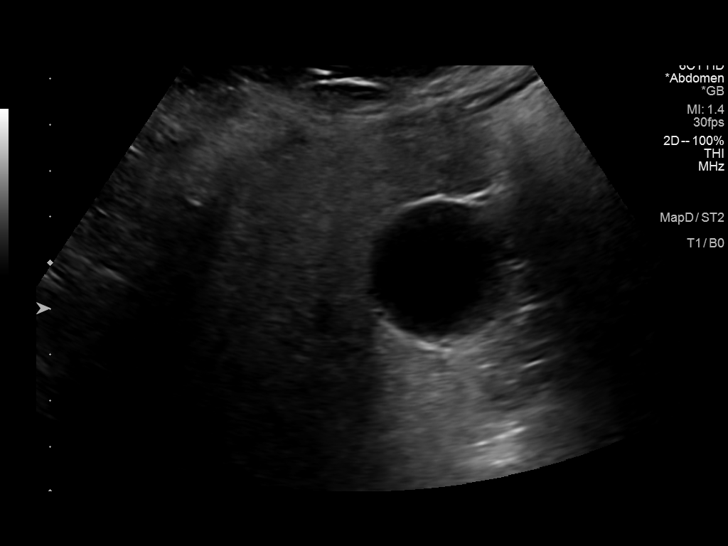
[im 16/91]
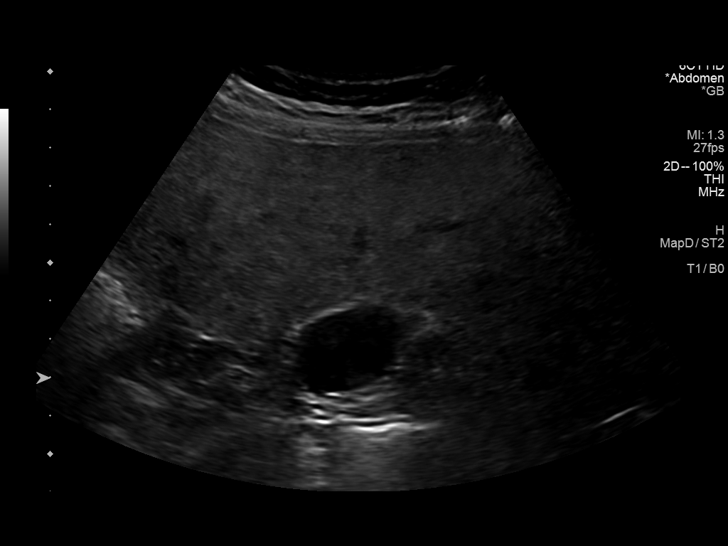
[im 23/91]
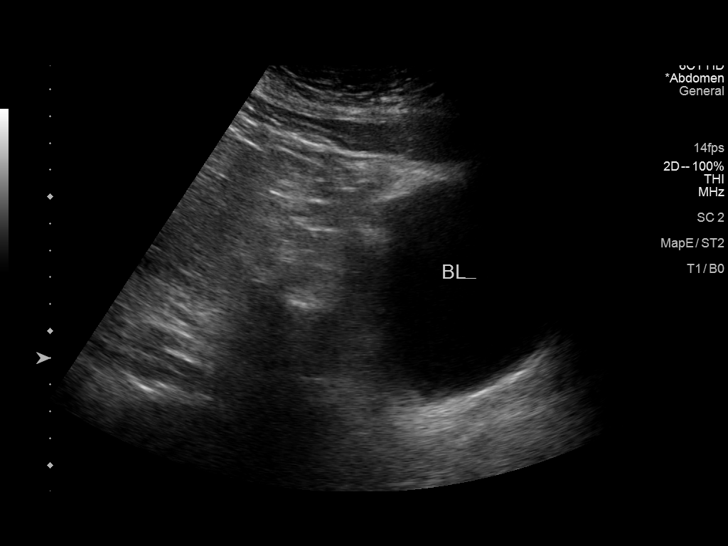
[im 31/91]
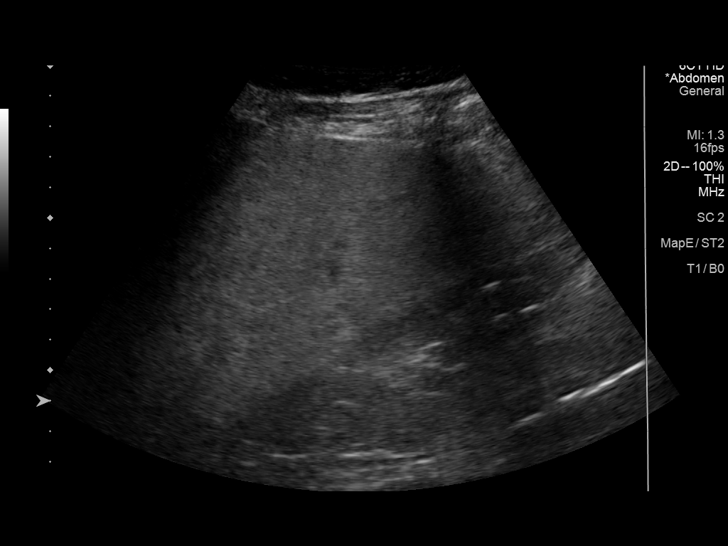
[im 34/91]
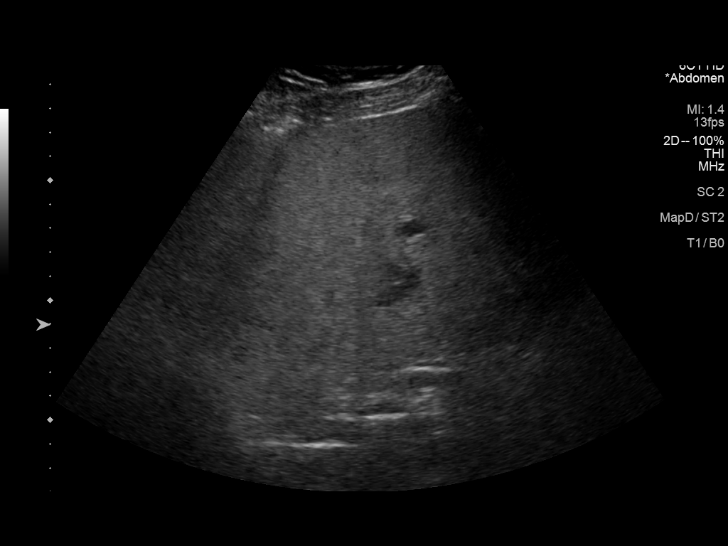
[im 42/91]
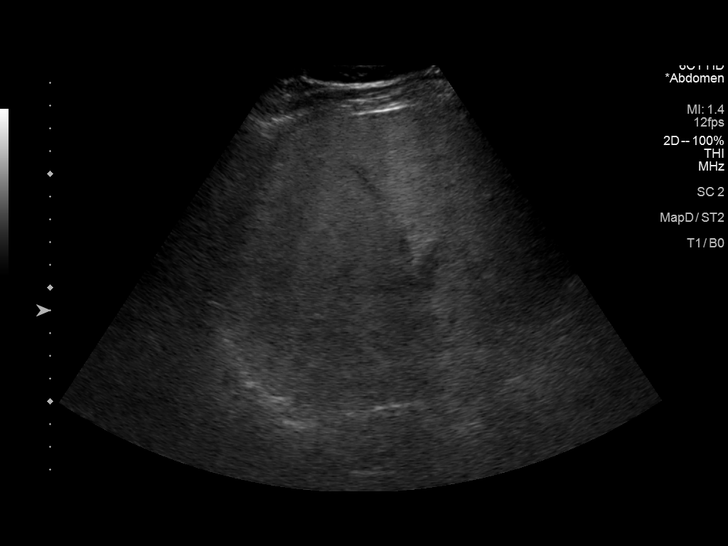
[im 49/91]
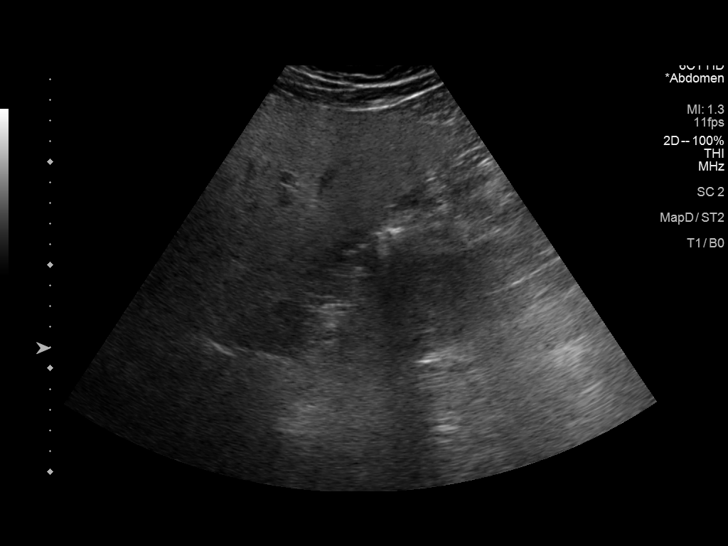
[im 57/91]
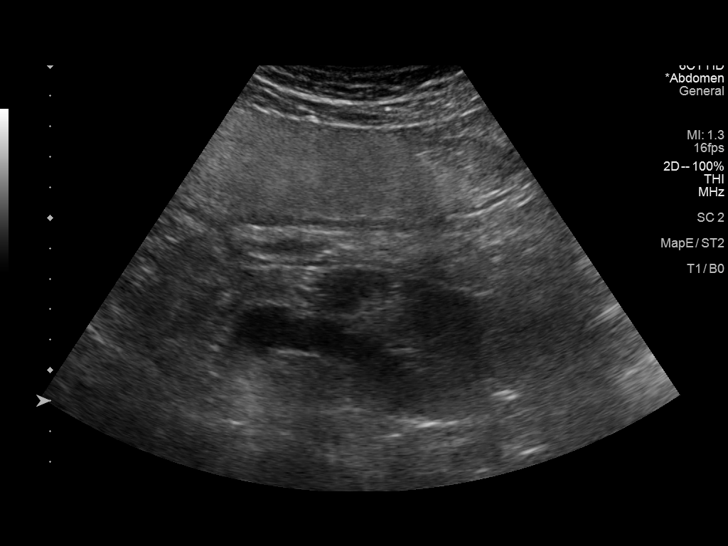
[im 61/91]
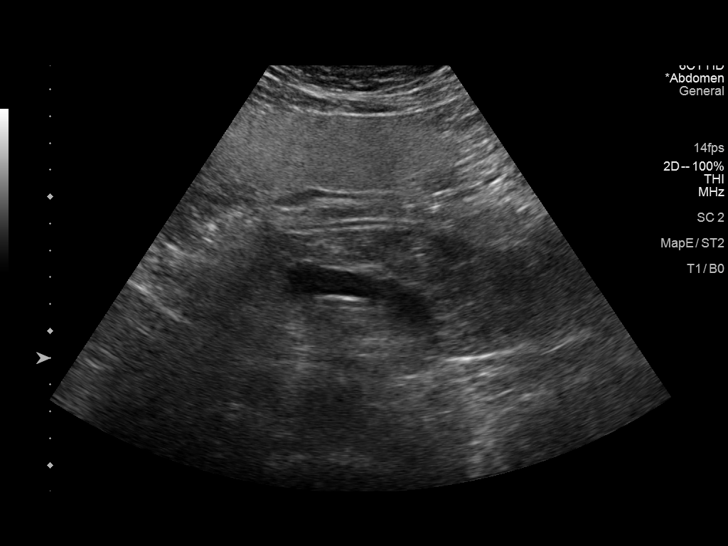
[im 68/91]
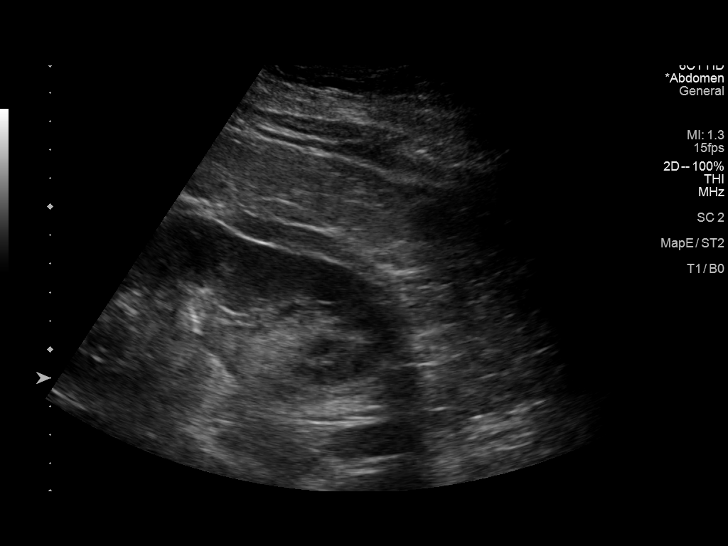
[im 76/91]
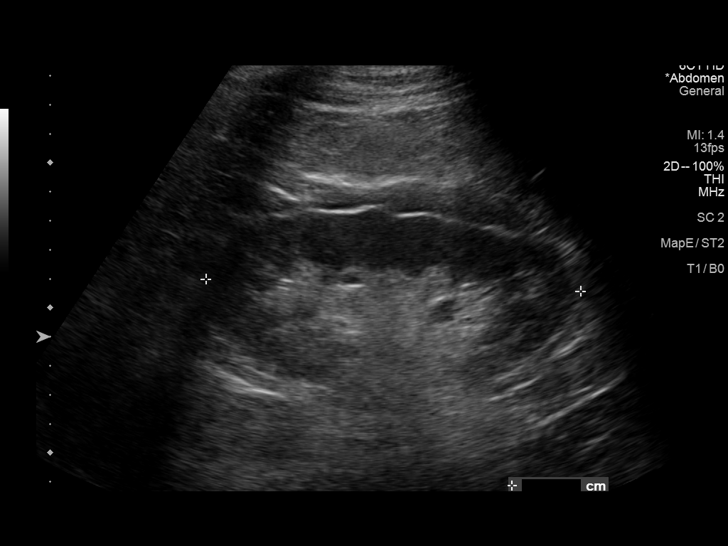
[im 83/91]
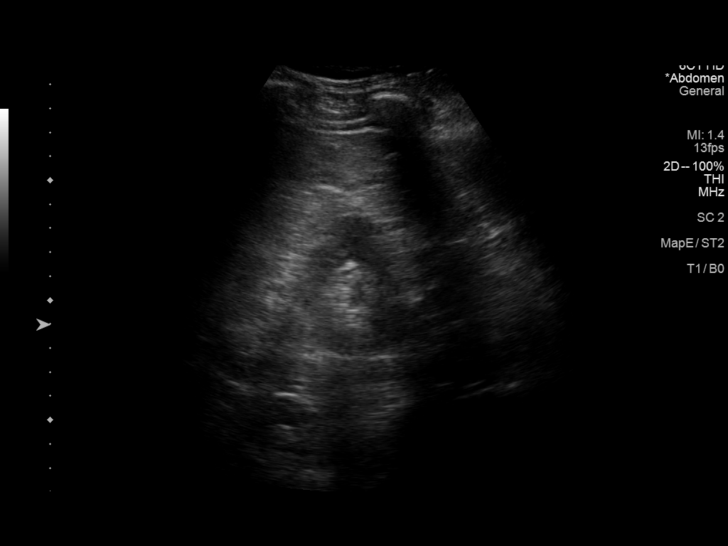
[im 91/91]
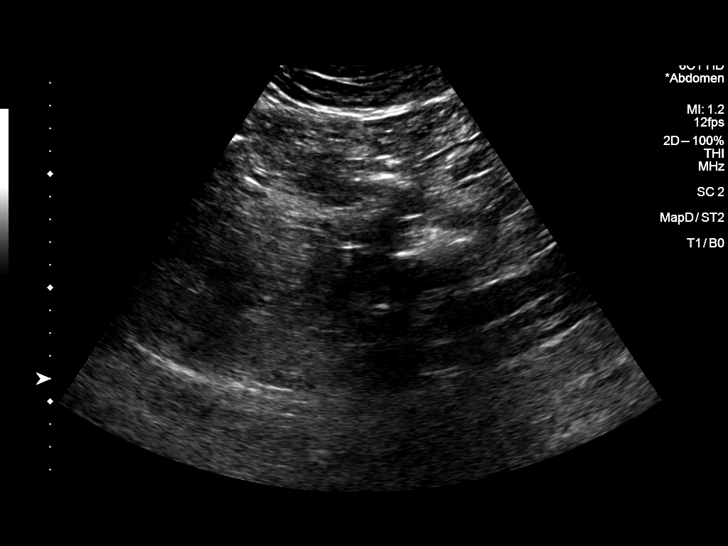

[14 of 25 positions shown; findings below may reference images not displayed]

FINDINGS: Gallbladder: Small 5 gallstones, the largest 6 mm. No wall
thickening or sonographic Murphy sign.

Common bile duct: Diameter: Normal caliber, 4 mm

Liver: Increased echotexture compatible with fatty infiltration. No
focal abnormality or biliary ductal dilatation. Portal vein is
patent on color Doppler imaging with normal direction of blood flow
towards the liver.

IVC: Not well visualized due to overlying bowel gas

Pancreas: Visualized portion unremarkable.

Spleen: Spleen is enlarged with a craniocaudal length of 13.5 cm and
a splenic volume of 882 mL.

Right Kidney: Length: 13.5 cm. Echogenicity within normal limits. No
mass or hydronephrosis visualized.

Left Kidney: Length: 13.0 cm. Echogenicity within normal limits. No
mass or hydronephrosis visualized.

Abdominal aorta: No aneurysm visualized.

Other findings: None.
IMPRESSION: Fatty infiltration of the liver.

Mild splenomegaly.

Cholelithiasis.  No sonographic evidence of acute cholecystitis.

## 2019-08-29 ENCOUNTER — Other Ambulatory Visit: Payer: Self-pay | Admitting: Allergy and Immunology

## 2019-09-18 ENCOUNTER — Other Ambulatory Visit: Payer: Self-pay | Admitting: Allergy and Immunology

## 2019-09-21 ENCOUNTER — Ambulatory Visit: Payer: 59 | Admitting: Allergy and Immunology

## 2019-09-21 ENCOUNTER — Encounter: Payer: Self-pay | Admitting: Allergy and Immunology

## 2019-09-21 ENCOUNTER — Other Ambulatory Visit: Payer: Self-pay

## 2019-09-21 VITALS — BP 118/66 | HR 88 | Resp 18 | Ht 72.0 in | Wt 256.0 lb

## 2019-09-21 DIAGNOSIS — J3089 Other allergic rhinitis: Secondary | ICD-10-CM

## 2019-09-21 DIAGNOSIS — K219 Gastro-esophageal reflux disease without esophagitis: Secondary | ICD-10-CM | POA: Diagnosis not present

## 2019-09-21 DIAGNOSIS — J4540 Moderate persistent asthma, uncomplicated: Secondary | ICD-10-CM

## 2019-09-21 DIAGNOSIS — G4733 Obstructive sleep apnea (adult) (pediatric): Secondary | ICD-10-CM | POA: Diagnosis not present

## 2019-09-21 MED ORDER — AZELASTINE HCL 0.1 % NA SOLN: NASAL | 5 refills | Status: AC

## 2019-09-21 MED ORDER — PANTOPRAZOLE SODIUM 40 MG PO TBEC: DELAYED_RELEASE_TABLET | ORAL | 5 refills | Status: DC

## 2019-09-21 NOTE — Patient Instructions (Addendum)
  1. Continue to Treat and prevent inflammation:   A.  OTC Nasacort-1 spray each nostril daily  2.  Continue to Treat and prevent reflux:   A.  Consolidate caffeine and chocolate consumption   B.  Pantoprazole 40 mg 2 tablets in a.m.  C.  Famotidine 40 mg in p.m.  D.  Finish evening meal prior to 7pm   3.  If needed:   A.  Albuterol MDI 2 puffs or nebulized albuterol every 4-6 hours  B. Azelastine -1-2 sprays each nostril 1-2 times a day  4. Can restart Symbicort 160 - 2 inhalations 2 times per day WITH SPACER during increased asthma symptoms  5.  Engage in progressive aerobic exercise program  6.  Continue 2 L oxygen to nasal pillows/CPAP (AHP)    7. Obtain flu vaccine and Covid vaccine  8. Return to clinic in 6 months or earlier if problem

## 2019-09-21 NOTE — Progress Notes (Signed)
[place] - [place] - [place] - [place] - [place]   Follow-up Note  Referring Provider: [name], MD Primary Provider: [name], MD Date of Office Visit: 09/21/2019  Subjective:   Donald Payne (DOB: August 04, 1965) is a 54 y.o. male who [facility] in re-evaluation of the following:  HPI: Brett Canales returns to this clinic in evaluation of asthma and LPR and allergic rhinitis and sleep apnea treated with CPAP and a history of cardiac deconditioning. His last visit to this clinic was 30 December 2018.  After demonstrating significant hypoxemia at nighttime he finally got everything straightened out and is now using CPAP with 2 L nasal cannula bled into the mask and had a nocturnal oximetry study which identified minimal amounts of time spent without adequate oxygenation while utilizing this combination.  This has significantly helped his headaches and his daytime fatigue.  He is at least 80% better regarding those issues.  His breathing is doing wonderful.  He has not really had any difficulty with his airway and he has not required a systemic steroid to treat an exacerbation of his asthma and his requirement for short acting bronchodilator is less than 1 time per week.  His nose has been doing well and he really believes that the use of nasal azelastine provides him very good relief regarding his rhinitis.  His reflux has been under good control but unfortunately he ran out of his pantoprazole recently and has had some issues with fullness in his chest and chest tightness.  He usually utilizes 80 mg of pantoprazole in the morning and 40 mg of famotidine in the evening.  It was suggested to him during his last visit that he undergo an aerobic exercise program which he has not done.  He contracted Covid infection in January 2021 mostly with a taste perversion issue that lasted at least a month.  Allergies as of 09/21/2019   No Known  Allergies     Medication List      albuterol (2.5 MG/3ML) 0.083% nebulizer solution Commonly known as: PROVENTIL Take 2.5 mg by nebulization 4 (four) times daily as needed.   albuterol 108 (90 Base) MCG/ACT inhaler Commonly known as: VENTOLIN HFA Can inhale two puffs every four to six hours as needed for cough or wheeze.   ALPRAZolam 0.5 MG tablet Commonly known as: XANAX TAKE 1/2 1 TABELT BY MOUTH EVERY 6 HOURS AS NEEDED FOR SEVERE ANXIETY   azelastine 0.1 % nasal spray Commonly known as: ASTELIN Place 1 spray into both nostrils 2 (two) times daily. Use in each nostril as directed   doxepin 10 MG capsule Commonly known as: SINEQUAN Take 10 mg by mouth at bedtime.   famotidine 40 MG tablet Commonly known as: PEPCID TAKE ONE TABLET ONCE EACH EVENING   glimepiride 2 MG tablet Commonly known as: AMARYL TAKE 1 TABLET BY MOUTH EVERY DAY WITH BREAKFAST OR FIRST MAIN MEAL OF THE DAY   Jardiance 25 MG Tabs tablet Generic drug: empagliflozin Take 25 mg by mouth daily.   L-Methylfolate 15 MG Tabs TAKE 1 TABLET BY MOUTH EVERY DAY **NOT COVERED BY INSURANCE**   Melatonin 10 MG Tabs Take by mouth at bedtime.   multivitamin tablet Take 1 tablet by mouth daily.   pantoprazole 40 MG tablet Commonly known as: PROTONIX TAKE 2 TABLETS BY MOUTH DAILY IN THE MORNING   quinapril 20 MG tablet Commonly known as: ACCUPRIL Take 10 mg by mouth daily.  sertraline 100 MG tablet Commonly known as: ZOLOFT Take 100 mg by mouth daily.   simvastatin 40 MG tablet Commonly known as: ZOCOR Take 40 mg by mouth daily.   Symbicort 160-4.5 MCG/ACT inhaler Generic drug: budesonide-formoterol PLEASE SEE ATTACHED FOR DETAILED DIRECTIONS   Toujeo Max SoloStar 300 UNIT/ML Solostar Pen Generic drug: insulin glargine (2 Unit Dial) SMARTSIG:15 Unit(s) SUB-Q Daily   TURMERIC CURCUMIN PO Take by mouth.   VITAMIN C PO Take by mouth daily.   VITAMIN D PO Take by mouth daily.   ZINC  PO Take by mouth daily.   zolpidem 10 MG tablet Commonly known as: AMBIEN TAKE 1 TABLET BY MOUTH AT BEDTIME AS NEEDED (DO NOT TAKE WITHIN 30 MINS OF FOOD)       Past Medical History:  Diagnosis Date  . Allergic rhinitis   . Allergy   . Anxiety   . Asthma   . Depression   . Diabetes mellitus (HCC)   . Dyslipidemia   . Fatigue   . GERD (gastroesophageal reflux disease)   . Hemorrhoids   . Hyperlipidemia   . Hypertension   . Hypoxia   . IBS (irritable bowel syndrome)   . Low testosterone   . Prostatitis   . Sleep apnea    uses CPAP  . Sleep apnea with hypersomnolence   . Vitamin D deficiency     Past Surgical History:  Procedure Laterality Date  . WISDOM TOOTH EXTRACTION      Review of systems negative except as noted in HPI / PMHx or noted below:  Review of Systems  Constitutional: Negative.   HENT: Negative.   Eyes: Negative.   Respiratory: Negative.   Cardiovascular: Negative.   Gastrointestinal: Negative.   Genitourinary: Negative.   Musculoskeletal: Negative.   Skin: Negative.   Neurological: Negative.   Endo/Heme/Allergies: Negative.   Psychiatric/Behavioral: Negative.      Objective:   Vitals:   09/21/19 1553  BP: 118/66  Pulse: 88  Resp: 18  SpO2: 95%   Height: 6' (182.9 cm)  Weight: 256 lb (116.1 kg)   Physical Exam Constitutional:      Appearance: He is not diaphoretic.  HENT:     Head: Normocephalic.     Right Ear: Tympanic membrane, ear canal and external ear normal.     Left Ear: Tympanic membrane, ear canal and external ear normal.     Nose: Nose normal. No mucosal edema or rhinorrhea.     Mouth/Throat:     Pharynx: Uvula midline. No oropharyngeal exudate.  Eyes:     Conjunctiva/sclera: Conjunctivae normal.  Neck:     Thyroid: No thyromegaly.     Trachea: Trachea normal. No tracheal tenderness or tracheal deviation.  Cardiovascular:     Rate and Rhythm: Normal rate and regular rhythm.     Heart sounds: Normal heart  sounds, S1 normal and S2 normal. No murmur heard.   Pulmonary:     Effort: No respiratory distress.     Breath sounds: Normal breath sounds. No stridor. No wheezing or rales.  Lymphadenopathy:     Head:     Right side of head: No tonsillar adenopathy.     Left side of head: No tonsillar adenopathy.     Cervical: No cervical adenopathy.  Skin:    Findings: No erythema or rash.     Nails: There is no clubbing.  Neurological:     Mental Status: He is alert.     Diagnostics:    Spirometry  was performed and demonstrated an FEV1 of 4.08 at 99 % of predicted.  Assessment and Plan:   1. Asthma, moderate persistent, well-controlled   2. Perennial allergic rhinitis   3. LPRD (laryngopharyngeal reflux disease)   4. Obstructive sleep apnea syndrome     1. EVERY DAY - Treat and prevent inflammation:   A.  OTC Nasacort-1 spray each nostril daily  2.  EVERY DAY - Treat and prevent reflux:   A.  Consolidate caffeine and chocolate consumption   B.  Pantoprazole 40 mg 2 tablets in a.m.  C.  Famotidine 40 mg in p.m.  D.  Finish evening meal prior to 7pm   3.  If needed:   A.  Albuterol MDI 2 puffs or nebulized albuterol every 4-6 hours  B. Azelastine -1-2 sprays each nostril 1-2 times a day  4. Can restart Symbicort 160 - 2 inhalations 2 times per day WITH SPACER during increased asthma symptoms  5.  Engage in progressive aerobic exercise program  6.  Continue 2 L oxygen to nasal pillows/CPAP (AHP)    7. Obtain flu vaccine and Covid vaccine  8. Return to clinic in 6 months or earlier if problem  Brett Canales is really doing very well on his current therapy with attention towards his inflammatory respiratory tract condition with the use of Nasacort and occasionally some Symbicort and with attention to his sleep apnea with hypoxemia treated with CPAP and supplemental oxygen and his reflux appears to be under good control on his current plan.  I refilled his medications and he can  follow-up in this clinic in 6 months or earlier if there is a problem.  Laurette Schimke, MD Allergy / Immunology [facility]

## 2019-09-22 ENCOUNTER — Encounter: Payer: Self-pay | Admitting: Allergy and Immunology

## 2019-11-21 ENCOUNTER — Telehealth: Payer: Self-pay | Admitting: Allergy and Immunology

## 2019-11-21 ENCOUNTER — Other Ambulatory Visit: Payer: Self-pay | Admitting: *Deleted

## 2019-11-21 MED ORDER — SYMBICORT 160-4.5 MCG/ACT IN AERO: INHALATION_SPRAY | RESPIRATORY_TRACT | 3 refills | Status: DC

## 2019-11-21 NOTE — Telephone Encounter (Signed)
RX sent

## 2019-11-21 NOTE — Telephone Encounter (Signed)
PT called to request symbicort refill [facility] in [place]

## 2020-02-14 ENCOUNTER — Other Ambulatory Visit: Payer: Self-pay

## 2020-02-14 ENCOUNTER — Telehealth: Payer: Self-pay | Admitting: Allergy and Immunology

## 2020-02-14 ENCOUNTER — Other Ambulatory Visit: Payer: Self-pay | Admitting: Allergy and Immunology

## 2020-02-14 MED ORDER — ALBUTEROL SULFATE HFA 108 (90 BASE) MCG/ACT IN AERS: INHALATION_SPRAY | RESPIRATORY_TRACT | 1 refills | Status: DC

## 2020-02-14 NOTE — Telephone Encounter (Signed)
[facility]

## 2020-02-14 NOTE — Telephone Encounter (Signed)
Refill sent in and patient was notified 

## 2020-03-05 MED ORDER — ALBUTEROL SULFATE HFA 108 (90 BASE) MCG/ACT IN AERS: INHALATION_SPRAY | RESPIRATORY_TRACT | 0 refills | Status: AC

## 2020-03-05 NOTE — Addendum Note (Signed)
Addended by: Alphonzo Cruise on: 03/05/2020 04:58 PM   Modules accepted: Orders

## 2020-03-05 NOTE — Telephone Encounter (Addendum)
Called patient and informed him that we would try to call in the generic and see if that was cheaper.  I asked patient to let us know if the generic was too expensive. Patient agreed with plan. [facility] [place].

## 2020-03-05 NOTE — Telephone Encounter (Signed)
Patient called back to say this prescription was $100. He wants to know if there is a copay card that would bring the price down or if their was an alternative that would not be as expensive.

## 2020-03-07 ENCOUNTER — Other Ambulatory Visit: Payer: Self-pay | Admitting: Allergy and Immunology

## 2020-04-05 ENCOUNTER — Other Ambulatory Visit: Payer: Self-pay | Admitting: Allergy and Immunology

## 2020-04-27 ENCOUNTER — Other Ambulatory Visit: Payer: Self-pay | Admitting: Allergy and Immunology

## 2020-05-01 ENCOUNTER — Other Ambulatory Visit: Payer: Self-pay | Admitting: Allergy and Immunology

## 2020-06-13 ENCOUNTER — Other Ambulatory Visit: Payer: Self-pay | Admitting: Allergy and Immunology

## 2020-07-20 ENCOUNTER — Other Ambulatory Visit (HOSPITAL_BASED_OUTPATIENT_CLINIC_OR_DEPARTMENT_OTHER): Payer: Self-pay

## 2020-07-20 DIAGNOSIS — G4733 Obstructive sleep apnea (adult) (pediatric): Secondary | ICD-10-CM

## 2020-09-04 ENCOUNTER — Other Ambulatory Visit: Payer: Self-pay

## 2020-09-04 ENCOUNTER — Ambulatory Visit (HOSPITAL_BASED_OUTPATIENT_CLINIC_OR_DEPARTMENT_OTHER): Payer: 59 | Attending: Internal Medicine | Admitting: Internal Medicine

## 2020-09-04 DIAGNOSIS — Z9989 Dependence on other enabling machines and devices: Secondary | ICD-10-CM | POA: Diagnosis not present

## 2020-09-04 DIAGNOSIS — G4733 Obstructive sleep apnea (adult) (pediatric): Secondary | ICD-10-CM | POA: Diagnosis present

## 2020-09-04 DIAGNOSIS — Z79899 Other long term (current) drug therapy: Secondary | ICD-10-CM | POA: Diagnosis not present

## 2020-09-24 NOTE — Procedures (Signed)
   NAME: Donald Payne DATE OF BIRTH:  [birth year] MEDICAL RECORD NUMBER [id number]  LOCATION: [facility]  PHYSICIAN: Deretha Emory  DATE OF STUDY: 09/04/2020  SLEEP STUDY TYPE: Positive Airway Pressure Titration               REFERRING PHYSICIAN: Deretha Emory, MD  EPWORTH SLEEPINESS SCORE:  9 HEIGHT: 6' (182.9 cm)  WEIGHT: 240 lb (108.9 kg)    Body mass index is 32.55 kg/m.  NECK SIZE: 16.5 in.   CLINICAL INFORMATION The patient was referred to the sleep center for evaluation of Established OSA, repeat titration for optimal efficacy. The patient had an overnight oximetry in 2020 that suggested persistent hypoxemia while on CPAP. Oxygen was added by his asthma specialist. This study done to see if desaturations persist and if BPAP would resolve them if present. He is on APAP with 95%ile pressure of 12.8.  MEDICATIONS Patient self administered medications include: BENADRYL, AMBIEN. Medications administered during study include - Ambien 10 mg at 09:47:40 PM.  SLEEP STUDY TECHNIQUE The patient underwent an attended overnight polysomnography titration to assess the effects of cpap therapy. The following variables were monitored: EEG(C4-A1, C3-A2, O1-A2, O2-A1), EOG, submental and leg EMG, ECG, oxyhemoglobin saturation by pulse oximetry, thoracic and abdominal respiratory effort belts, nasal/oral airflow by pressure sensor, body position sensor and snoring sensor. CPAP pressure was titrated to eliminate apneas, hypopneas and oxygen desaturation.  TECHNICAL COMMENTS Comments added by Technician: NONE Comments added by Scorer: N/A  SLEEP ARCHITECTURE The study was initiated at 10:41:06 PM and terminated at 4:56:59 AM. Total recorded time was 375.9 minutes. EEG confirmed total sleep time was 310.5 minutes yielding a sleep efficiency of 82.6%%. Sleep onset after lights out was 3.8 minutes with a REM latency of 162.0 minutes. The patient spent 11.6%% of the night in  stage N1 sleep, 69.4%% in stage N2 sleep, 0.0%% in stage N3 and 19% in REM. The Arousal Index was 15.1/hour.  RESPIRATORY PARAMETERS The overall AHI was 0.6 per hour, and the RDI was 0.8 events/hour with a central apnea index of 0per hour. The most appropriate setting of CPAP was IPAP/EPAP 10/10 cm H2O. At this setting, the sleep efficiency was 77%, the patient was supine for 3%, the AHI was 0 events per hour, and the RDI was 0.3 events/hour (with 0 central events) and the arousal index was 16.1 per hour and the oxygen nadir was 89.0%. No supplemental oxgen was needed.   LEG MOVEMENT DATA The total leg movements were 0 with a resulting leg movement index of 0.0/hr. Associated arousal with leg movement index was 0.0/hr.  CARDIAC DATA The underlying cardiac rhythm was most consistent with sinus rhythm. Mean heart rate during sleep was 69.1 bpm. Additional rhythm abnormalities include None.  IMPRESSIONS - Obstructive Sleep apnea (OSA) Optimal pressure attained. Oxygen not needed based on this study  DIAGNOSIS - Obstructive Sleep Apnea (G47.33)  RECOMMENDATIONS - Continue APAP as this has shown resolution in events on downloads. No oxygen seems to be necessary   Deretha Emory Sleep specialist, American Board of Internal Medicine  ELECTRONICALLY SIGNED ON:  09/24/2020, 7:57 PM [facility] PH: [phone]   FX: [phone] ACCREDITED BY THE AMERICAN ACADEMY OF SLEEP MEDICINE

## 2020-11-05 ENCOUNTER — Ambulatory Visit: Payer: 59 | Admitting: Psychiatry

## 2020-11-05 ENCOUNTER — Encounter: Payer: Self-pay | Admitting: *Deleted

## 2020-11-05 ENCOUNTER — Other Ambulatory Visit: Payer: Self-pay | Admitting: *Deleted

## 2020-11-05 ENCOUNTER — Encounter: Payer: Self-pay | Admitting: Psychiatry

## 2020-11-05 VITALS — BP 123/71 | HR 95 | Ht 72.0 in | Wt 265.0 lb

## 2020-11-05 DIAGNOSIS — R519 Headache, unspecified: Secondary | ICD-10-CM | POA: Diagnosis not present

## 2020-11-05 DIAGNOSIS — G4733 Obstructive sleep apnea (adult) (pediatric): Secondary | ICD-10-CM

## 2020-11-05 MED ORDER — ELETRIPTAN HYDROBROMIDE 40 MG PO TABS
40.0000 mg | ORAL_TABLET | ORAL | 0 refills | Status: DC | PRN
Start: 2020-11-05 — End: 2020-12-03

## 2020-11-05 MED ORDER — GABAPENTIN 100 MG PO CAPS: 300.0000 mg | ORAL_CAPSULE | Freq: Three times a day (TID) | ORAL | Status: DC

## 2020-11-05 NOTE — Progress Notes (Signed)
Referring:  Donald Payne, Donald Payne [phone] Donald [address] [place],  [place] [address]  PCP: Donald Payne, Donald Payne  Neurology was asked to evaluate Donald Payne, a 55 year old male for a chief complaint of headaches.  Our recommendations of care will be communicated by shared medical record.    CC:  headaches  HPI:  Medical co-morbidities: HLD, DM, asthma, OSA on CPAP, depression   The patient presents for evaluation of daily headaches which began one year ago. He is unsure if they gradually worsened over time or were immediately daily. Prior to this he did have a period of time where he suffered from daily headaches until he was found to have sleep apnea and was started on CPAP. He did have a recent sleep study which showed AHI of 0.6 per hour.  He does have pain free moments between headaches. Feels his headaches are associated with insomnia and lack of sleep. Daily headaches are described as a 2/10 dull ache. Every 1-2 months he will have a more severe headache which is associated with dizziness (sensation of imbalance, denies vertigo), photophobia, and phonophobia. Takes 3 ibuprofen as needed for headaches which helps temporarily but headaches will often return. Took Topamax previously which did not seem that effective.  Headache History: Onset: 1 year ago Triggers: lack of sleep Most common time of day for headache to begin: any time Location: bifrontal Quality/Description: dull ache Associated Symptoms:  Photophobia: yes, sometimes  Phonophobia: yes  Nausea: yes Worse with activity?: Duration of headaches: 1 day Migraine headache days per month: once per month Migraine severity: 6/10 Non-migraine headache days per month: daily Non-migraine headache severity: 2/10  Headache days per month: 30 Headache free days per month: 0  Current Treatment: Abortive Ibuprofen  Preventative Gabapentin 200 mg BID - sleep Doxepin 10 mg QHS - sleep  Prior Therapies                                  Duration of Use           Dose                          Side effect Doxepin 10 mg QHS Topamax 75 mg daily Gabapentin 200 mg BID Trintellix 20 mg daily  Headache Risk Factors: Headache risk factors and/or co-morbidities (-) Neck Pain (+) Back pain (-) History of Motor Vehicle Accident (+) Sleep Disorder - OSA, insomnia (+) Obesity  Body mass index is 35.94 kg/m. (-) History of Traumatic Brain Injury and/or Concussion  LABS: none  IMAGING:  MRI/MRA brain 12/2017: nonspecific white matter changes, normal MRA  Imaging independently reviewed on November 05, 2020   Current Outpatient Medications on File Prior to Visit  Medication Sig Dispense Refill   albuterol (PROVENTIL) (2.5 MG/3ML) 0.083% nebulizer solution Take 2.5 mg by nebulization 4 (four) times daily as needed.     albuterol (VENTOLIN HFA) 108 (90 Base) MCG/ACT inhaler Can inhale two puffs every four to six hours as needed for cough or wheeze. 18 g 0   ALPRAZolam (XANAX) 0.5 MG tablet TAKE 1/2 1 TABELT BY MOUTH EVERY 6 HOURS AS NEEDED FOR SEVERE ANXIETY     Ascorbic Acid (VITAMIN C PO) Take by mouth daily.     azelastine (ASTELIN) 0.1 % nasal spray Can use one to two sprays in each nostril one to two times daily as  directed. 30 mL 5   famotidine (PEPCID) 40 MG tablet TAKE ONE TABLET ONCE EACH EVENING 30 tablet 0   HUMALOG MIX 75/25 KWIKPEN (75-25) 100 UNIT/ML Kwikpen SMARTSIG:20 Unit(s) SUB-Q Twice Daily     JARDIANCE 25 MG TABS tablet Take 25 mg by mouth daily.     L-Methylfolate 15 MG TABS TAKE 1 TABLET BY MOUTH EVERY DAY **NOT COVERED BY INSURANCE**     LAVENDER OIL PO Take by mouth.     pantoprazole (PROTONIX) 40 MG tablet TAKE TWO TABLETS EVERY MORNING AS DIRECTED. 60 tablet 0   SYMBICORT 160-4.5 MCG/ACT inhaler INHALE TWO PUFFS TWICE DAILY TO PREVENT COUGH OR WHEEZE. RINSE MOUTH AFTER USE. 10.2 each 0   TRINTELLIX 20 MG TABS tablet Take 20 mg by mouth daily.     No current facility-administered  medications on file prior to visit.     Allergies: Allergies  Allergen Reactions   Lipitor [Atorvastatin]     Muscle soreness   Symbicort [Budesonide-Formoterol Fumarate]     Tongue/throat soreness    Family History: Migraine or other headaches in the family:  mother Aneurysms in a first degree relative:  mother Brain tumors in the family:  no Other neurological illness in the family:   no  Past Medical History: Past Medical History:  Diagnosis Date   Allergic rhinitis    Allergy    Anxiety    Asthma    Depression    Diabetes mellitus (HCC)    Dyslipidemia    Fatigue    GERD (gastroesophageal reflux disease)    Headache    Hemorrhoids    Hyperlipidemia    Hypertension    Hypoxia    IBS (irritable bowel syndrome)    Low testosterone    Prostatitis    Sleep apnea    uses CPAP   Sleep apnea with hypersomnolence    Vitamin D deficiency     Past Surgical History Past Surgical History:  Procedure Laterality Date   WISDOM TOOTH EXTRACTION      Social History: Social History   Tobacco Use   Smoking status: Never   Smokeless tobacco: Never  Vaping Use   Vaping Use: Never used  Substance Use Topics   Alcohol use: Not Currently   Drug use: No    ROS: Negative for fevers, chills. Positive for headaches. All other systems reviewed and negative unless states otherwise in HPI.   Physical Exam:   Vital Signs: BP 123/71   Pulse 95   Ht 6' (1.829 m)   Wt 265 lb (120.2 kg)   BMI 35.94 kg/m  GENERAL: well appearing,in no acute distress,alert SKIN:  Color, texture, turgor normal. No rashes or lesions HEAD:  Normocephalic/atraumatic. CV:  RRR RESP: Normal respiratory effort MSK: no tenderness to palpation over occiput, neck, or shoulders  NEUROLOGICAL: Mental Status: Alert, oriented to person, place and time,Follows commands Cranial Nerves: PERRL,visual fields intact to confrontation,extraocular movements intact,facial sensation intact,no facial droop  or ptosis,hearing intact to finger rub bilaterally,no dysarthria,palate elevate symmetrically,tongue protrudes midline,shoulder shrug intact and symmetric Motor: muscle strength 5/5 both upper and lower extremities,no drift, normal tone Reflexes: 2+ throughout Sensation: intact to light touch all 4 extremities Coordination: Finger-to- nose-finger intact bilaterally,Heel-to-shin intact bilaterally Gait: normal-based   IMPRESSION: 56 year old male with a history of  HLD, DM, asthma, OSA on CPAP, depression who presents for evaluation of daily headaches over the past year. The majority of his headaches are consistent with chronic tension type headache, though he  does have ~1 headache with migrainous features per month. His sleep issues do appear to be a major trigger for his headaches. Will refer to Sleep neurology for further evaluation of OSA and insomnia. For headache prevention will increase gabapentin to 300 mg TID. He will try Relpax for the one severe headache he has per month. Discussed that he may have a component of medication overuse headache as well. Counseled on limiting rescue medications to 2 days per week to avoid rebound headaches.  PLAN: -Increase gabapentin to 300 mg TID -Relpax mg PRN for migraine -Counseled on limiting OTC use to 2 days per week or less to avoid rebound headaches  -Referral to Dr. Vickey Huger for OSA, insomnia   Headache education was done. Discussed lifestyle modification including increased oral hydration, decreased caffeine, exercise and stress management. Discussed treatment options including preventive and acute medications, natural supplements, and infusion therapy. Discussed medication overuse headache and to limit use of acute treatments to no more than 2 days/week or 10 days/month. Discussed medication side effects, adverse reactions and drug interactions. Written educational materials and patient instructions outlining all of the above were  given.  Follow-up: 3 months   Donald Doyne, Donald Payne 11/05/2020   2:57 PM

## 2020-11-05 NOTE — Patient Instructions (Addendum)
Increase gabapentin to 300 mg in the morning, 300 mg in the afternoon, and 300 mg at night Start Relpax 40 mg as needed for severe headache. Take at the onset of migraine. If headache recurs or does not fully resolve, you may take a second dose after 2 hours. Please avoid taking more than 2 days per week  Limit over the counter medication use to 2 days per week or less to avoid rebound headaches Referral to Dr. Vickey Huger for insomnia/sleep apnea

## 2020-12-01 ENCOUNTER — Other Ambulatory Visit: Payer: Self-pay | Admitting: Psychiatry

## 2020-12-26 ENCOUNTER — Ambulatory Visit: Payer: 59 | Admitting: Neurology

## 2021-01-18 ENCOUNTER — Other Ambulatory Visit: Payer: Self-pay | Admitting: Surgery

## 2021-02-18 ENCOUNTER — Ambulatory Visit: Payer: 59 | Admitting: Psychiatry

## 2021-03-13 ENCOUNTER — Encounter: Payer: Self-pay | Admitting: Psychiatry

## 2021-03-13 ENCOUNTER — Ambulatory Visit: Payer: 59 | Admitting: Psychiatry

## 2021-03-13 VITALS — BP 100/57 | HR 78 | Ht 72.0 in | Wt 268.0 lb

## 2021-03-13 DIAGNOSIS — G43719 Chronic migraine without aura, intractable, without status migrainosus: Secondary | ICD-10-CM

## 2021-03-13 DIAGNOSIS — R519 Headache, unspecified: Secondary | ICD-10-CM | POA: Diagnosis not present

## 2021-03-13 MED ORDER — SUMATRIPTAN SUCCINATE 100 MG PO TABS: 100.0000 mg | ORAL_TABLET | ORAL | 2 refills | Status: AC | PRN

## 2021-03-13 MED ORDER — GABAPENTIN 300 MG PO CAPS: 600.0000 mg | ORAL_CAPSULE | Freq: Two times a day (BID) | ORAL | 3 refills | Status: DC

## 2021-03-13 NOTE — Patient Instructions (Addendum)
Increase gabapentin to 600 mg twice a day  Start Imitrex as needed for severe headaches. Take at the onset of a bad headache. If headache recurs or does not fully resolve, you may take a second dose after 2 hours. Please avoid taking more than 2 days per week to avoid rebound headaches

## 2021-03-13 NOTE — Progress Notes (Signed)
° °  CC:  headaches  Follow-up Visit  Last visit: 11/05/21  Brief HPI: 56 year old male with a history of HLD, DM, asthma, OSA on CPAP, depression who follows in clinic for daily tension-type headaches and episodic migraines which began in 2021.  At his last visit his gabapentin was increased to 300 mg TID and he was prescribed Relpax for migraine rescue.  Interval History: He has not noticed a big difference in his headaches with increased gabapentin. Forgets to take the afternoon dose of gabapentin ~50% of the time.  Daily headaches are now associated with mild phonophobia and nausea. Feels his headaches are worse when his anxiety is worse. Takes half a xanax when he has a headache which seems to help. Recently started Caplyta for his mood as well.  Relpax was denied by insurance so he was prescribed rizatriptan instead. Rizatriptan takes the edge of off his worst headaches but does not resolve them.  He is using his CPAP at night.  Headache days per month: 30 Headache free days per month: 0  Current Headache Regimen: Preventative: gabapentin 300 mg TID Abortive: Maxalt 10 mg PRN  Prior Therapies                                 Duration of Use           Dose                          Side effect Doxepin 10 mg QHS Topamax 75 mg daily Gabapentin 300 mg TID Trintellix 20 mg daily Maxalt 10 mg PRN  Physical Exam:   Vital Signs: BP (!) 100/57    Pulse 78    Ht 6' (1.829 m)    Wt 268 lb (121.6 kg)    BMI 36.35 kg/m  GENERAL:  well appearing, in no acute distress, alert  SKIN:  Color, texture, turgor normal. No rashes or lesions HEAD:  Normocephalic/atraumatic. RESP: normal respiratory effort MSK:  No gross joint deformities.   NEUROLOGICAL: Mental Status: Alert, oriented to person, place and time, Follows commands, and Speech fluent and appropriate. Cranial Nerves: PERRL, face symmetric, no dysarthria, hearing grossly intact Motor: moves all extremities equally Gait:  normal-based.  IMPRESSION: 56 year old male with a history of HLD, DM, asthma, OSA on CPAP, depression who presents for follow up of headaches. He has not noticed much of a difference since increasing gabapentin, but he does frequently forget to take his afternoon dose. Will increase dose and switch to BID dosing to see if this is more effective. Will switch Maxalt to Imitrex and see if this is more effective.  PLAN: -Preventive: Increase gabapentin to 600 mg BID, uptitrate further as needed -Rescue: Start Imitrex 100 mg PRN -next steps: consider propranolol, CGRP, Botox   Follow-up: 3 months  I spent a total of 26 minutes on the date of the service. Headache education was done.  Discussed treatment options including preventive and acute medications. Discussed medication side effects, adverse reactions and drug interactions. Written educational materials and patient instructions outlining all of the above were given.  Ocie Doyne, MD 03/14/21 4:21 PM

## 2021-03-26 ENCOUNTER — Ambulatory Visit: Payer: 59 | Admitting: Psychiatry

## 2021-05-07 ENCOUNTER — Encounter: Payer: Self-pay | Admitting: Psychiatry

## 2021-05-07 MED ORDER — GABAPENTIN 300 MG PO CAPS: 600.0000 mg | ORAL_CAPSULE | Freq: Two times a day (BID) | ORAL | 3 refills | Status: DC

## 2021-05-29 ENCOUNTER — Other Ambulatory Visit: Payer: Self-pay | Admitting: Neurology

## 2021-05-29 MED ORDER — NURTEC 75 MG PO TBDP: 75.0000 mg | ORAL_TABLET | ORAL | 3 refills | Status: AC

## 2021-05-29 NOTE — Telephone Encounter (Signed)
Nurtec sent to the pharmacy

## 2021-05-29 NOTE — Telephone Encounter (Signed)
In Dr. Delena Bali last note, she noted that future consideration include CGRP. I wonder if insurance will approve Ajovy if the patient has not tried and failed oral CGRP like Nurtec. If he is willing, I can write him for Nurtec, it is both preventive and abortive

## 2021-05-30 ENCOUNTER — Telehealth: Payer: Self-pay | Admitting: *Deleted

## 2021-05-30 NOTE — Telephone Encounter (Signed)
Nurtec PA, key [id number]. Your information has been sent to [facility] ?

## 2021-05-30 NOTE — Telephone Encounter (Signed)
NURTEC TAB 75MG  ODT, use as directed (18 per month), is approved through 05/31/2022. Sent my chart to patient.  ?

## 2021-06-05 ENCOUNTER — Telehealth: Payer: Self-pay | Admitting: *Deleted

## 2021-06-05 DIAGNOSIS — R519 Headache, unspecified: Secondary | ICD-10-CM

## 2021-06-05 MED ORDER — AJOVY 225 MG/1.5ML ~~LOC~~ SOAJ: 1.5000 mL | SUBCUTANEOUS | 2 refills | Status: DC

## 2021-06-05 MED ORDER — EMGALITY (300 MG DOSE) 100 MG/ML ~~LOC~~ SOSY: 1.0000 "application " | PREFILLED_SYRINGE | SUBCUTANEOUS | 2 refills | Status: DC

## 2021-06-05 NOTE — Telephone Encounter (Signed)
The request for coverage for Emgality Inj 100mg /Ml, use as directed (94mL per month), is denied. This decision is based on health plan criteria for Emgality. This medicine is covered only if: ?You have a diagnosis of episodic cluster headache. ?

## 2021-06-05 NOTE — Telephone Encounter (Signed)
Emgality 300 mg PA, key [id number]. Your information has been sent to [facility] ?

## 2021-06-05 NOTE — Telephone Encounter (Signed)
Ajovy PA, key [id number], faxed notes to be attached. ?Received e mail: documents attached. PA sent to plan. Your information has been sent to [facility]. ?

## 2021-06-05 NOTE — Addendum Note (Signed)
Addended by: Larey Seat on: 06/05/2021 12:55 PM ? ? Modules accepted: Orders ? ?

## 2021-06-05 NOTE — Telephone Encounter (Signed)
Dr Delena Bali mentioned alternatives : Dr Delena Bali had considered prevention with Ajovy if Ubrelvy failed.  ? ?Please offer Ajovy/ or Emgality.  ?Melvyn Novas, MD  ? ? ?

## 2021-06-06 NOTE — Telephone Encounter (Addendum)
Ajovy denied: ?Why was my request denied? ?This request was denied because you did not meet the following clinical requirements: ?The requested medication and/or diagnosis are not a covered benefit and excluded from coverage in accordance with the terms and conditions of your plan benefit. Therefore, the request has been administratively denied. Discussed with provider and called patietn. I advised him at this point we ask that he wait until Dr Delena Bali returns next Monday to make a decision. We have tried 2 different CGRP and neither was approved. I advised he he gets a headache he cannot tolerate to go to Urgent Care. He verbalized understanding, appreciation. ? ?

## 2021-06-11 NOTE — Telephone Encounter (Signed)
He has chronic migraine, I updated my encounter with him to include the diagnosis. Can we appeal with updated diagnosis? Thanks

## 2021-06-12 ENCOUNTER — Encounter: Payer: Self-pay | Admitting: *Deleted

## 2021-06-12 NOTE — Telephone Encounter (Signed)
Hulda Marin PA started with (Key: [id number]), new dx code [phone]. Your information has been sent to [facility]. ?

## 2021-06-12 NOTE — Telephone Encounter (Signed)
Ajovy denied: Ajovy is not a covered benefit and excluded from coverage. Called [facility] PA line, spoke with V who stated an appeal can be filed. She was unable to give any specific information as to why it was denied.  Appeal letter on MD desk for review, signature. Letter signed and faxed to [facility] appeals with office notes.  ?

## 2021-06-17 ENCOUNTER — Ambulatory Visit: Payer: 59 | Admitting: Psychiatry

## 2021-06-17 ENCOUNTER — Encounter: Payer: Self-pay | Admitting: Psychiatry

## 2021-06-17 NOTE — Progress Notes (Deleted)
   CC:  headaches  Follow-up Visit  Last visit: 03/13/21  Brief HPI: 56 year old male with a history of HLD, DM, asthma, OSA on CPAP, depression who follows in clinic for daily tension-type headaches and episodic migraines which began in 2021.  At his last visit gabapentin was increased to 600 mg BID and he was started on Imitrex for rescue. Interval History: He continued to have headaches on gabapentin so Ajovy was ordered*** This was denied by insurance and is currently pending appeal***  Imitrex***Was prescribed Nurtec which he tried a couple of times and was not effective.   Headache days per month: *** Headache free days per month: *** Headache severity: ***  Current Headache Regimen: Preventative: *** Abortive: ***  # of doses of abortive medications per month: ***  Prior Therapies                                  Doxepin 10 mg QHS Topamax 75 mg daily Gabapentin 300 mg TID Trintellix 20 mg daily Maxalt 10 mg PRN Imitrex 100 mg PRN Nurtec 75 mg PRN  Physical Exam:   Vital Signs: There were no vitals taken for this visit. GENERAL:  well appearing, in no acute distress, alert  SKIN:  Color, texture, turgor normal. No rashes or lesions HEAD:  Normocephalic/atraumatic. RESP: normal respiratory effort MSK:  No gross joint deformities.   NEUROLOGICAL: Mental Status: Alert, oriented to person, place and time, Follows commands, and Speech fluent and appropriate. Cranial Nerves: PERRL, face symmetric, no dysarthria, hearing grossly intact Motor: moves all extremities equally Gait: normal-based.  IMPRESSION: ***  PLAN: *** Consider propranolol, botox***  Follow-up: ***  I spent a total of *** minutes on the date of the service. Headache education was done. Discussed lifestyle modification including increased oral hydration, decreased caffeine, exercise and stress management. Discussed treatment options including preventive and acute medications, natural  supplements, and infusion therapy. Discussed medication overuse headache and to limit use of acute treatments to no more than 2 days/week or 10 days/month. Discussed medication side effects, adverse reactions and drug interactions. Written educational materials and patient instructions outlining all of the above were given.  Ocie Doyne, MD

## 2021-06-25 ENCOUNTER — Other Ambulatory Visit: Payer: Self-pay | Admitting: Psychiatry

## 2021-06-25 ENCOUNTER — Encounter: Payer: Self-pay | Admitting: *Deleted

## 2021-06-25 ENCOUNTER — Telehealth: Payer: Self-pay | Admitting: *Deleted

## 2021-06-25 MED ORDER — AIMOVIG 70 MG/ML ~~LOC~~ SOAJ
70.0000 mg | SUBCUTANEOUS | 3 refills | Status: DC
Start: 2021-06-25 — End: 2022-02-05

## 2021-06-25 NOTE — Telephone Encounter (Addendum)
Aimovig 70 mg PA, Key: [id number], G43.719. ?Your information has been sent to [facility]. [facility] is reviewing your PA request. Typically an electronic response will be received within 24-72 hours. ?

## 2021-06-25 NOTE — Telephone Encounter (Signed)
Called [facility] help desk, spoke with Candy who stated ajovy denied on 06/12/21. I advised her I faxed appeal on 06/12/21. She is unable to see if it was received, advised I call [phone]. Called # and reached [facility], spoke with advocate who transferred to provider line, [phone]. Spoke with [facility] who stated appeal was received and Ajovy still denied.  It is a plan exclusion; there are therapeutic alternatives:  ?Aimovig, emgality,  nurtec ODT. Will send this to Dr Delena Bali.  ?

## 2021-06-25 NOTE — Telephone Encounter (Signed)
Rx for Aimovig sent to his pharmacy, thanks

## 2021-06-25 NOTE — Telephone Encounter (Signed)
Aimovig approved till 12/26/21. Approval letter faxed to pharmacy. Sent my chart to patient.   ?

## 2021-09-25 ENCOUNTER — Other Ambulatory Visit: Payer: Self-pay | Admitting: Psychiatry

## 2022-02-04 ENCOUNTER — Other Ambulatory Visit: Payer: Self-pay | Admitting: Psychiatry

## 2022-08-27 ENCOUNTER — Ambulatory Visit: Payer: 59 | Admitting: Orthopaedic Surgery

## 2022-08-27 ENCOUNTER — Other Ambulatory Visit (INDEPENDENT_AMBULATORY_CARE_PROVIDER_SITE_OTHER): Payer: 59

## 2022-08-27 ENCOUNTER — Encounter: Payer: Self-pay | Admitting: Orthopaedic Surgery

## 2022-08-27 VITALS — Ht 69.75 in | Wt 247.0 lb

## 2022-08-27 DIAGNOSIS — G8929 Other chronic pain: Secondary | ICD-10-CM

## 2022-08-27 DIAGNOSIS — M25561 Pain in right knee: Secondary | ICD-10-CM

## 2022-08-27 MED ORDER — LIDOCAINE HCL 1 % IJ SOLN
3.0000 mL | INTRAMUSCULAR | Status: AC | PRN
  Administered 2022-08-27: 3 mL

## 2022-08-27 MED ORDER — METHYLPREDNISOLONE ACETATE 40 MG/ML IJ SUSP
40.0000 mg | INTRAMUSCULAR | Status: AC | PRN
  Administered 2022-08-27: 40 mg via INTRA_ARTICULAR

## 2022-08-27 MED ORDER — CELECOXIB 200 MG PO CAPS
200.0000 mg | ORAL_CAPSULE | Freq: Two times a day (BID) | ORAL | 1 refills | Status: DC | PRN
Start: 2022-08-27 — End: 2022-10-22

## 2022-08-27 NOTE — Progress Notes (Signed)
The patient is a 57 year old type I diabetic without seeing for the first time as it relates to his right knee.  He said the onset of his pain is about 3 months ago with some swelling.  He does have pain and pop in his knee.  He clicks with each step.  He points to the posterior medial aspect of his knee as a source of his pain.  He is never had any type of steroid injection in the knee he does watch his blood glucose closely.  He has never had surgery on his knee either.  Examination of his right knee shows just a mild effusion.  He has slight varus malalignment is easily correctable.  There is no significant patellofemoral arthritic changes but there is medial joint line tenderness and posterior medial pain.  X-rays of the right knee show no acute findings and the joint spaces well-maintained.  I did recommend a steroid injection and Celebrex for his knee to see if this will help.  I want him to work on quad strengthening exercises as well.  He will watch his blood glucose closely.  He did tolerate the steroid injection well.  We will see him back in a month to see how he is doing overall.  If he is still having the same symptoms we would then recommend a MRI of the right knee.  All questions and concerns were addressed and answered.    Procedure Note  Patient: Donald Payne             Date of Birth: March 28, 1965           MRN: [id number]             Visit Date: 08/27/2022  Procedures: Visit Diagnoses:  1. Chronic pain of right knee     Large Joint Inj: R knee on 08/27/2022 9:54 AM Indications: diagnostic evaluation and pain Details: 22 G 1.5 in needle, superolateral approach  Arthrogram: No  Medications: 3 mL lidocaine 1 %; 40 mg methylPREDNISolone acetate 40 MG/ML Outcome: tolerated well, no immediate complications Procedure, treatment alternatives, risks and benefits explained, specific risks discussed. Consent was given by the patient. Immediately prior to procedure a time out was  called to verify the correct patient, procedure, equipment, support staff and site/side marked as required. Patient was prepped and draped in the usual sterile fashion.

## 2022-10-08 ENCOUNTER — Ambulatory Visit: Payer: 59 | Admitting: Orthopaedic Surgery

## 2022-10-08 ENCOUNTER — Encounter: Payer: Self-pay | Admitting: Orthopaedic Surgery

## 2022-10-08 DIAGNOSIS — M25561 Pain in right knee: Secondary | ICD-10-CM

## 2022-10-08 NOTE — Progress Notes (Signed)
HPI: Donald Payne 57 year old male comes in today follow-up right knee.  Status post right knee injection 08/27/2022.  States the injection helped for about a week.  He states the pain is came back.  He has pain medial and lateral aspect of the knee.  He describes a snapping like sensation in the knee.  Also mechanical symptoms positive for giving way and some painful popping particularly the lateral aspect of the knee.  Notes swelling of the knee.  He has been wrapping the knee with an Ace bandage.  Physical exam: General Well-developed well-nourished male in no acute distress.  Ambulates without any assistive device with a slight antalgic gait.  Right knee full range of motion.  Tenderness along medial lateral joint line.  Positive effusion no abnormal warmth or erythema.  McMurray's positive.  No instability valgus varus stressing.  Impression: Acute right knee pain  Plan: Given the fact the patient is given the knee time and has had a cortisone injection which only gave him relief for a week recommend MRI rule out meniscal tear.  Again radiographs of his knee showed no significant arthritic changes and well maintained joint spaces.  I have him follow-up after the MRI to go over results discuss further treatment.  Questions were encouraged and answered at length by Dr. Magnus Ivan and myself.

## 2022-10-09 ENCOUNTER — Other Ambulatory Visit: Payer: Self-pay

## 2022-10-09 DIAGNOSIS — G8929 Other chronic pain: Secondary | ICD-10-CM

## 2022-10-19 ENCOUNTER — Ambulatory Visit
Admission: RE | Admit: 2022-10-19 | Discharge: 2022-10-19 | Disposition: A | Discharge status: Home / Self Care | Payer: 59 | Source: Ambulatory Visit | Attending: Orthopaedic Surgery | Admitting: Orthopaedic Surgery

## 2022-10-19 DIAGNOSIS — G8929 Other chronic pain: Secondary | ICD-10-CM

## 2022-10-22 ENCOUNTER — Other Ambulatory Visit: Payer: Self-pay | Admitting: Orthopaedic Surgery

## 2022-10-29 ENCOUNTER — Encounter: Payer: Self-pay | Admitting: Orthopaedic Surgery

## 2022-10-29 ENCOUNTER — Ambulatory Visit: Payer: 59 | Admitting: Orthopaedic Surgery

## 2022-10-29 DIAGNOSIS — M25461 Effusion, right knee: Secondary | ICD-10-CM

## 2022-10-29 DIAGNOSIS — S83231D Complex tear of medial meniscus, current injury, right knee, subsequent encounter: Secondary | ICD-10-CM

## 2022-10-29 NOTE — Progress Notes (Signed)
The patient is a 57 year old gentleman who comes in to go over a MRI of his right knee due to recurrent effusions of that knee with locking catching and medial joint line pain.  His plain films showed well-maintained joint space and he felt conservative treatment with time, anti-inflammatories, rest, injections and aspirations.  Examination of his right knee today shows a recurrent effusion again.  The knee is warm.  There is a positive Murray sign to the medial compartment of the knee with a snapping sensation like a plica.  He has patellofemoral pain as well.  The MRI of his right knee does show moderate effusion.  There is thinning of the articular cartilage but no full-thickness cartilage defects in any compartment.  There are signal changes in the posterior horn of the medial meniscus and lateral meniscus and there is not stating there is a meniscal tear but based on looking at the study and his clinical exam this may be a tear that is laying down appropriately since the exam is a static exam in terms of the MRI.  I went over his findings with him.  I am recommending an arthroscopic intervention of his right knee given the continued effusion combined with the MRI findings and his clinical exam findings of the right knee.  I explained what the surgery involves as well as discussed the risk and benefits of the surgery.  All question and concerns were answered and addressed.  We will be in touch with scheduling him for a right knee arthroscopy.

## 2022-11-13 ENCOUNTER — Other Ambulatory Visit: Payer: Self-pay | Admitting: Orthopaedic Surgery

## 2022-11-13 DIAGNOSIS — M65161 Other infective (teno)synovitis, right knee: Secondary | ICD-10-CM | POA: Diagnosis not present

## 2022-11-13 MED ORDER — HYDROCODONE-ACETAMINOPHEN 7.5-325 MG PO TABS: 1.0000 | ORAL_TABLET | Freq: Four times a day (QID) | ORAL | 0 refills | Status: AC | PRN

## 2022-11-20 ENCOUNTER — Encounter: Payer: Self-pay | Admitting: Orthopaedic Surgery

## 2022-11-20 ENCOUNTER — Ambulatory Visit: Payer: 59 | Admitting: Orthopaedic Surgery

## 2022-11-20 DIAGNOSIS — Z9889 Other specified postprocedural states: Secondary | ICD-10-CM

## 2022-11-20 NOTE — Progress Notes (Signed)
The patient is a 57 year old who is here for his first postoperative visit 1 week after right knee arthroscopy.  He had been having intermittent swelling of that knee with clicking and catching.  In surgery we found surprisingly a very pristine looking knee in terms of the cartilage throughout the knee.  The meniscus was intact medially and laterally as well as the ACL and PCL.  We did find a medial plica that look like that was irritating the medial femoral condyle and we did remove that.  On exam he does have a moderate effusion from surgery with that right knee.  I was able to aspirate about 40 cc of bloody fluid from the knee and place a sterile injection in his knee to give him immediate relief and he is able to bend his knee better as well.  I did go over his arthroscopy pictures with him.  His calf is soft and otherwise he looks good.  Will see him back in 4 weeks to see how he is doing overall.  All question concerns were answered and addressed.

## 2022-12-18 ENCOUNTER — Encounter: Payer: 59 | Admitting: Orthopaedic Surgery

## 2022-12-19 ENCOUNTER — Encounter: Payer: Self-pay | Admitting: Physician Assistant

## 2022-12-19 ENCOUNTER — Ambulatory Visit (INDEPENDENT_AMBULATORY_CARE_PROVIDER_SITE_OTHER): Payer: 59 | Admitting: Physician Assistant

## 2022-12-19 DIAGNOSIS — Z9889 Other specified postprocedural states: Secondary | ICD-10-CM

## 2022-12-19 NOTE — Progress Notes (Signed)
Office Visit Note   Patient: Donald Payne           Date of Birth: [birth year]           MRN: [id number] Visit Date: 12/19/2022              Requested by: [name], MD [phone] [place],  [place] [address] PCP: [name], MD  Chief Complaint  Patient presents with   Right Knee - Routine Post Op      HPI: Donald Payne is a pleasant 57 year old gentleman who is a patient of Dr. Magnus Ivan.  He is now 6 weeks status post right knee arthroscopy.  He was supposed to come in yesterday for his postoperative appointment but there was a mixup apparently in his appointment times and dates.  He has had problems with intermittent swelling and clicking in his knee since surgery.  Denies any fever chills or calf pain.  He says the swelling in his knee does get better when he elevates his leg above his heart.  His wife was concerned and wondering if a culture should be done.  Rates his pain is mild mostly complains of the clicking and stiffness in his knee  Assessment & Plan: Visit Diagnoses: Postop right knee arthroscopy  Plan: I reviewed the arthroscopy notes that were discussed he apparently had a medial plica the rest of his knee looked fairly good.  I did discuss with him that unfortunately he cannot take oral anti-inflammatories so this is not of much assistance.  I encouraged him to continue to ice and elevate and try not to leave his leg in a dependent position.  I see absolutely nothing that would indicate an infection based on his clinical appearance.  He has some fullness in the back of his knee but in very little findings consistent with an effusion.  Will follow-up with Dr. Magnus Ivan or Delane Ginger in 3 to 4 weeks of course follow-up as with [place] at any time if things change.  Follow-Up Instructions: 3 to 4 weeks  Ortho Exam  Patient is alert, oriented, no adenopathy, well-dressed, normal affect, normal respiratory effort. Examination of his right knee he has well-healed  surgical portals minimal effusion, no warmth or edema.  Has fairly good range of motion slightly stiff to secondary to just swelling.  Calves are soft and nontender negative Homans' sign no evidence of cellulitis  Imaging: No results found. No images are attached to the encounter.  Labs: No results found for: "HGBA1C", "ESRSEDRATE", "CRP", "LABURIC", "REPTSTATUS", "GRAMSTAIN", "CULT", "LABORGA"   No results found for: "ALBUMIN", "PREALBUMIN", "CBC"  No results found for: "MG" No results found for: "VD25OH"  No results found for: "PREALBUMIN"    Latest Ref Rng & Units 04/08/2018    8:11 AM  CBC EXTENDED  WBC 3.4 - 10.8 x10E3/uL 6.3   RBC 4.14 - 5.80 x10E6/uL 5.48   Hemoglobin 13.0 - 17.7 g/dL 40.1   HCT 02.7 - 25.3 % 47.4   Platelets 150 - 450 x10E3/uL 165   NEUT# 1.4 - 7.0 x10E3/uL 4.3   Lymph# 0.7 - 3.1 x10E3/uL 1.1      There is no height or weight on file to calculate BMI.  Orders:  No orders of the defined types were placed in this encounter.  No orders of the defined types were placed in this encounter.    Procedures: No procedures performed  Clinical Data: No additional findings.  ROS:  All other systems negative, except  as noted in the HPI. Review of Systems  Objective: Vital Signs: There were no vitals taken for this visit.  Specialty Comments:  No specialty comments available.  PMFS History: Patient Active Problem List   Diagnosis Date Noted   Hypertensive heart disease 07/06/2018   Asthma 07/06/2018   Sleep apnea 07/06/2018   Morbid obesity (HCC) 07/06/2018   Dyspnea 10/27/2011   Past Medical History:  Diagnosis Date   Allergic rhinitis    Allergy    Anxiety    Asthma    Depression    Diabetes mellitus (HCC)    Dyslipidemia    Fatigue    GERD (gastroesophageal reflux disease)    Headache    Hemorrhoids    Hyperlipidemia    Hypertension    Hypoxia    IBS (irritable bowel syndrome)    Low testosterone    Prostatitis    Sleep  apnea    uses CPAP   Sleep apnea with hypersomnolence    Vitamin D deficiency     Family History  Problem Relation Age of Onset   Migraines Mother    Colon cancer Mother    Hypertension Mother    Diabetes Mother    Heart disease Father    Diabetes Paternal Grandmother    Heart disease Paternal Grandmother    Prostate cancer Paternal Grandfather    Healthy Other    Coronary artery disease Other        Deceased age 17   Esophageal cancer Neg Hx    Rectal cancer Neg Hx    Stomach cancer Neg Hx     Past Surgical History:  Procedure Laterality Date   WISDOM TOOTH EXTRACTION     Social History   Occupational History   Occupation: Field seismologist  Tobacco Use   Smoking status: Never   Smokeless tobacco: Never  Vaping Use   Vaping status: Never Used  Substance and Sexual Activity   Alcohol use: Not Currently   Drug use: No   Sexual activity: Not on file

## 2023-01-14 ENCOUNTER — Encounter: Payer: Self-pay | Admitting: Orthopaedic Surgery

## 2023-01-14 ENCOUNTER — Ambulatory Visit: Payer: 59 | Admitting: Orthopaedic Surgery

## 2023-01-14 DIAGNOSIS — Z9889 Other specified postprocedural states: Secondary | ICD-10-CM

## 2023-01-14 MED ORDER — PREDNISONE 20 MG PO TABS: 20.0000 mg | ORAL_TABLET | Freq: Every day | ORAL | 0 refills | Status: AC

## 2023-01-14 NOTE — Progress Notes (Signed)
The patient is seen in follow-up about 2 months out from a right knee arthroscopy.  This was performed for recurrent effusions we found intact cartilage of the knee but a medial plica.  He is still having effusion with his right knee.  At his first postoperative visit I did aspirate his knee and placed a steroid injection in his knee.  He said that was painful.  On examination of his right knee there is still pain around the medial plica area and a mild effusion with his right knee.  We will put him on prednisone 20 mg for 6 days and see if this will help with his inflammation.  We are hesitant to try any anti-inflammatory due to being told at one point he had kidney disease.  He said that was new to him but that was what the anesthesiologist said.  He does see his physician who deals with diabetes in January of this coming year next month so we will see him back in February to see what he is told about his blood glucose and kidney labs at that visit.  We can then potentially consider a steroid injection in his knee at that visit.  I would like him to try a copper fit knee sleeve as well.  We did offer physical therapy for him but he wants to see what this will do first.  He will watch his blood glucose closely while he is on prednisone for 6 days.

## 2023-01-15 ENCOUNTER — Ambulatory Visit (INDEPENDENT_AMBULATORY_CARE_PROVIDER_SITE_OTHER): Payer: 59 | Admitting: Adult Health

## 2023-01-15 ENCOUNTER — Encounter: Payer: Self-pay | Admitting: Adult Health

## 2023-01-15 VITALS — BP 107/65 | HR 89 | Ht 73.0 in | Wt 250.8 lb

## 2023-01-15 DIAGNOSIS — F411 Generalized anxiety disorder: Secondary | ICD-10-CM | POA: Diagnosis not present

## 2023-01-15 DIAGNOSIS — F429 Obsessive-compulsive disorder, unspecified: Secondary | ICD-10-CM

## 2023-01-15 DIAGNOSIS — G4700 Insomnia, unspecified: Secondary | ICD-10-CM | POA: Diagnosis not present

## 2023-01-15 DIAGNOSIS — F331 Major depressive disorder, recurrent, moderate: Secondary | ICD-10-CM | POA: Diagnosis not present

## 2023-01-15 MED ORDER — BUSPIRONE HCL 10 MG PO TABS: ORAL_TABLET | ORAL | 2 refills | Status: DC

## 2023-01-15 NOTE — Progress Notes (Signed)
[facility] MD/PA/NP Initial Note  01/15/2023 3:39 PM Donald Payne  MRN:  [id number]  Chief Complaint:   HPI:   Patient seen today for initial psychiatric evaluation.   Previously diagnosed with MDD, GAD, OCD, and insomnia.  Most recently seen at the [facility].   Describes mood today as "ok". Pleasant. Denies tearfulness. Mood symptoms - reports depression, anxiety, and irritability. Reports a "few" panic attacks over the past 3 to 4 months. Reports worry, rumination and over thinking. Mood is stable. Stating "I feel like I'm doing ok, but could be better". Reports currently taking Trintellix for mood symptoms. Stable interest and motivation. Taking medications as prescribed.  Energy levels vary. Active, does not have a regular exercise routine.  Enjoys some usual interests and activities. Married. Lives with wife of 8 to 9 years. Wife is a traveling Engineer, civil (consulting). No children - stepchildren. Spending time with family. Appetite adequate. Weight 245 pounds. Sleeps well most nights. Averages 7 to 8 hours. Focus and concentration stable. Completing tasks. Managing aspects of household. Retired - [employer].  Denies SI or HI.  Denies AH or VH. Denies self harm.  Denies substance use.  Previous medication trials: Ambien, Lithium, Caplyta and others.  Visit Diagnosis:    ICD-10-CM   1. Generalized anxiety disorder  F41.1 busPIRone (BUSPAR) 10 MG tablet    2. Major depressive disorder, recurrent episode, moderate (HCC)  F33.1     3. Obsessive-compulsive disorder, unspecified type  F42.9 busPIRone (BUSPAR) 10 MG tablet    4. Insomnia, unspecified type  G47.00       Past Psychiatric History: Reports one psychiatric admission at age 7 [facility].  Past Medical History:  Past Medical History:  Diagnosis Date   Allergic rhinitis    Allergy    Anxiety    Asthma    Depression    Diabetes mellitus (HCC)    Dyslipidemia    Fatigue    GERD (gastroesophageal  reflux disease)    Headache    Hemorrhoids    Hyperlipidemia    Hypertension    Hypoxia    IBS (irritable bowel syndrome)    Low testosterone    Prostatitis    Sleep apnea    uses CPAP   Sleep apnea with hypersomnolence    Vitamin D deficiency     Past Surgical History:  Procedure Laterality Date   WISDOM TOOTH EXTRACTION      Family Psychiatric History: Reports extended family with mental health illness.   Family History:  Family History  Problem Relation Age of Onset   Migraines Mother    Colon cancer Mother    Hypertension Mother    Diabetes Mother    Heart disease Father    Diabetes Paternal Grandmother    Heart disease Paternal Grandmother    Prostate cancer Paternal Grandfather    Healthy Other    Coronary artery disease Other        Deceased age 34   Esophageal cancer Neg Hx    Rectal cancer Neg Hx    Stomach cancer Neg Hx     Social History:  Social History   Socioeconomic History   Marital status: Married    Spouse name: Lowella Bandy   Number of children: 0   Years of education: Not on file   Highest education level: Master's degree (e.g., MA, MS, MEng, MEd, MSW, MBA)  Occupational History   Occupation: Field seismologist  Tobacco Use   Smoking status: Never   Smokeless  tobacco: Never  Vaping Use   Vaping status: Never Used  Substance and Sexual Activity   Alcohol use: Not Currently   Drug use: No   Sexual activity: Not on file  Other Topics Concern   Not on file  Social History Narrative   Lives with wife   Caffeine- 3-4 diet sodas a day   Social Determinants of Health   Financial Resource Strain: Not on file  Food Insecurity: Not on file  Transportation Needs: Not on file  Physical Activity: Not on file  Stress: Not on file  Social Connections: Not on file    Allergies:  Allergies  Allergen Reactions   Atorvastatin Other (See Comments)    Muscle soreness  Muscle soreness  Muscle soreness  Muscle soreness, Muscle soreness    Budesonide-Formoterol Fumarate Other (See Comments)    Tongue/throat soreness   Metformin Hcl Diarrhea    Other reaction(s): Diarhea    Metabolic Disorder Labs: No results found for: "HGBA1C", "MPG" No results found for: "PROLACTIN" No results found for: "CHOL", "TRIG", "HDL", "CHOLHDL", "VLDL", "LDLCALC" No results found for: "TSH"  Therapeutic Level Labs: No results found for: "LITHIUM" No results found for: "VALPROATE" No results found for: "CBMZ"  Current Medications: Current Outpatient Medications  Medication Sig Dispense Refill   busPIRone (BUSPAR) 10 MG tablet Take 1/2 tablet twice daily for 7 days, then increase to one tablet twice daily. 60 tablet 2   albuterol (PROVENTIL) (2.5 MG/3ML) 0.083% nebulizer solution Take 2.5 mg by nebulization 4 (four) times daily as needed.     albuterol (VENTOLIN HFA) 108 (90 Base) MCG/ACT inhaler Can inhale two puffs every four to six hours as needed for cough or wheeze. 18 g 0   ALPRAZolam (XANAX) 0.5 MG tablet TAKE 1/2 1 TABELT BY MOUTH EVERY 6 HOURS AS NEEDED FOR SEVERE ANXIETY     Ascorbic Acid (VITAMIN C PO) Take by mouth daily.     azelastine (ASTELIN) 0.1 % nasal spray Can use one to two sprays in each nostril one to two times daily as directed. 30 mL 5   celecoxib (CELEBREX) 200 MG capsule TAKE 1 CAPSULE (200 MG TOTAL) BY MOUTH 2 (TWO) TIMES DAILY BETWEEN MEALS AS NEEDED. 60 capsule 1   Cholecalciferol (VITAMIN D) 50 MCG (2000 UT) CAPS 1 capsule     doxepin (SINEQUAN) 10 MG capsule 1 capsule at bedtime     Erenumab-aooe (AIMOVIG) 70 MG/ML SOAJ INJECT 70 MG INTO THE SKIN EVERY 30 DAYS 1 mL 1   famotidine (PEPCID) 40 MG tablet TAKE ONE TABLET ONCE EACH EVENING 30 tablet 0   gabapentin (NEURONTIN) 300 MG capsule TAKE 2 CAPSULES BY MOUTH 2 TIMES DAILY. 120 capsule 3   HUMALOG MIX 75/25 KWIKPEN (75-25) 100 UNIT/ML Kwikpen SMARTSIG:20 Unit(s) SUB-Q Twice Daily     HYDROcodone-acetaminophen (NORCO) 7.5-325 MG tablet Take 1 tablet by mouth  every 6 (six) hours as needed for moderate pain. 30 tablet 0   JARDIANCE 25 MG TABS tablet Take 25 mg by mouth daily.     L-Methylfolate 15 MG TABS TAKE 1 TABLET BY MOUTH EVERY DAY **NOT COVERED BY INSURANCE**     LAVENDER OIL PO Take by mouth.     lisinopril-hydrochlorothiazide (ZESTORETIC) 20-12.5 MG tablet Take 1 tablet by mouth daily.     lumateperone tosylate (CAPLYTA) 42 MG capsule Take 42 mg by mouth daily.     pantoprazole (PROTONIX) 40 MG tablet TAKE TWO TABLETS EVERY MORNING AS DIRECTED. 60 tablet 0  pramipexole (MIRAPEX) 1.5 MG tablet Take 1.5 mg by mouth daily.     predniSONE (DELTASONE) 20 MG tablet Take 1 tablet (20 mg total) by mouth daily with breakfast. 6 tablet 0   simvastatin (ZOCOR) 40 MG tablet 1 tablet in the evening     SUMAtriptan (IMITREX) 100 MG tablet Take 1 tablet (100 mg total) by mouth every 2 (two) hours as needed for migraine. May repeat in 2 hours if headache persists or recurs. 10 tablet 2   SYMBICORT 160-4.5 MCG/ACT inhaler INHALE TWO PUFFS TWICE DAILY TO PREVENT COUGH OR WHEEZE. RINSE MOUTH AFTER USE. 10.2 each 0   TRINTELLIX 20 MG TABS tablet Take 20 mg by mouth daily.     zolpidem (AMBIEN) 10 MG tablet Take 10 mg by mouth at bedtime as needed.     No current facility-administered medications for this visit.    Medication Side Effects: none  Orders placed this visit:  No orders of the defined types were placed in this encounter.   Psychiatric Specialty Exam:  Review of Systems  Musculoskeletal:  Negative for gait problem.  Neurological:  Negative for tremors.  Psychiatric/Behavioral:         Please refer to HPI    Blood pressure 107/65, pulse 89, height 6\' 1"  (1.854 m), weight 250 lb 12.8 oz (113.8 kg).Body mass index is 33.09 kg/m.  General Appearance: Casual and Neat  Eye Contact:  Good  Speech:  Clear and Coherent and Normal Rate  Volume:  Normal  Mood:  Anxious  Affect:  Appropriate and Congruent  Thought Process:  Coherent and  Descriptions of Associations: Intact  Orientation:  Full (Time, Place, and Person)  Thought Content: Logical   Suicidal Thoughts:  No  Homicidal Thoughts:  No  Memory:  WNL  Judgement:  Good  Insight:  Good  Psychomotor Activity:  Normal  Concentration:  Concentration: Good and Attention Span: Good  Recall:  Good  Fund of Knowledge: Good  Language: Good  Assets:  Communication Skills Desire for Improvement Financial Resources/Insurance Housing Intimacy Leisure Time Physical Health Resilience Social Support Talents/Skills Transportation Vocational/Educational  ADL's:  Intact  Cognition: WNL  Prognosis:  Good   Screenings: MDQ  Receiving Psychotherapy: Yes   Treatment Plan/Recommendations:   Plan:  PDMP reviewed  Trintellix 20mg  daily Xanax 0.5mg  at hs for sleep Doxepin 10mg  at hs  RTC 4 weeks  Patient advised to contact office with any questions, adverse effects, or acute worsening in signs and symptoms.      Dorothyann Gibbs, NP

## 2023-02-06 ENCOUNTER — Other Ambulatory Visit: Payer: Self-pay | Admitting: Adult Health

## 2023-02-06 DIAGNOSIS — F411 Generalized anxiety disorder: Secondary | ICD-10-CM

## 2023-02-06 DIAGNOSIS — F429 Obsessive-compulsive disorder, unspecified: Secondary | ICD-10-CM

## 2023-02-12 ENCOUNTER — Ambulatory Visit: Payer: 59 | Admitting: Adult Health

## 2023-02-12 ENCOUNTER — Encounter: Payer: Self-pay | Admitting: Adult Health

## 2023-02-12 DIAGNOSIS — G4700 Insomnia, unspecified: Secondary | ICD-10-CM

## 2023-02-12 DIAGNOSIS — F429 Obsessive-compulsive disorder, unspecified: Secondary | ICD-10-CM

## 2023-02-12 DIAGNOSIS — F411 Generalized anxiety disorder: Secondary | ICD-10-CM

## 2023-02-12 DIAGNOSIS — F331 Major depressive disorder, recurrent, moderate: Secondary | ICD-10-CM

## 2023-02-12 MED ORDER — ALPRAZOLAM 0.5 MG PO TABS
0.5000 mg | ORAL_TABLET | Freq: Every evening | ORAL | 1 refills | Status: DC | PRN
Start: 2023-02-12 — End: 2023-04-09

## 2023-02-12 MED ORDER — PRAMIPEXOLE DIHYDROCHLORIDE 1.5 MG PO TABS
1.5000 mg | ORAL_TABLET | Freq: Every day | ORAL | 1 refills | Status: DC
Start: 2023-02-12 — End: 2023-06-17

## 2023-02-12 MED ORDER — BUSPIRONE HCL 10 MG PO TABS
ORAL_TABLET | ORAL | 2 refills | Status: DC
Start: 2023-02-12 — End: 2023-03-12

## 2023-02-12 NOTE — Progress Notes (Signed)
 Donald Payne [id number] 05/15/1965 58 y.o.  Subjective:   Patient ID:  Donald Payne is a 58 y.o. (DOB 09-16-1965) male.  Chief Complaint: No chief complaint on file.   HPI [name] presents to the office today for follow-up of MDD, GAD, OCD, and insomnia.  Most recently seen at the [facility].   Describes mood today as ok. Pleasant. Denies tearfulness. Mood symptoms - denies depression. Reports decreased anxiety - a little better. Reports irritability at times. Denies recent panic attacks. Reports some worry, rumination and over thinking. Mood is stable. Feels like the addition of Buspar  has been helpful, but would like to increase the dose. Stating I feel like I'm doing a little better. Stable interest and motivation. Taking medications as prescribed.  Energy levels vary. Active, does not have a regular exercise routine.  Enjoys some usual interests and activities. Married. Lives with wife of 8 to 9 years. Wife is a traveling engineer, civil (consulting). No children - stepchildren. Spending time with family. Appetite adequate. Weight 245 pounds. Sleeps well most nights. Averages 7 to 8 hours. Focus and concentration stable. Completing tasks. Managing aspects of household. Retired - [employer].  Denies SI or HI.  Denies AH or VH. Denies self harm.  Denies substance use.  Previous medication trials: Ambien, Lithium, Caplyta and others.   Review of Systems:  Review of Systems  Musculoskeletal:  Negative for gait problem.  Neurological:  Negative for tremors.  Psychiatric/Behavioral:         Please refer to HPI   Medications: I have reviewed the patient's current medications.  Current Outpatient Medications  Medication Sig Dispense Refill   albuterol  (PROVENTIL ) (2.5 MG/3ML) 0.083% nebulizer solution Take 2.5 mg by nebulization 4 (four) times daily as needed.     albuterol  (VENTOLIN  HFA) 108 (90 Base) MCG/ACT inhaler Can inhale two puffs every four to six hours as  needed for cough or wheeze. 18 g 0   ALPRAZolam  (XANAX ) 0.5 MG tablet TAKE 1/2 1 TABELT BY MOUTH EVERY 6 HOURS AS NEEDED FOR SEVERE ANXIETY     Ascorbic Acid (VITAMIN C PO) Take by mouth daily.     azelastine  (ASTELIN ) 0.1 % nasal spray Can use one to two sprays in each nostril one to two times daily as directed. 30 mL 5   busPIRone  (BUSPAR ) 10 MG tablet Take 1/2 tablet twice daily for 7 days, then increase to one tablet twice daily. 60 tablet 2   celecoxib  (CELEBREX ) 200 MG capsule TAKE 1 CAPSULE (200 MG TOTAL) BY MOUTH 2 (TWO) TIMES DAILY BETWEEN MEALS AS NEEDED. 60 capsule 1   Cholecalciferol (VITAMIN D) 50 MCG (2000 UT) CAPS 1 capsule     doxepin  (SINEQUAN ) 10 MG capsule 1 capsule at bedtime     Erenumab -aooe (AIMOVIG ) 70 MG/ML SOAJ INJECT 70 MG INTO THE SKIN EVERY 30 DAYS 1 mL 1   famotidine  (PEPCID ) 40 MG tablet TAKE ONE TABLET ONCE EACH EVENING 30 tablet 0   gabapentin  (NEURONTIN ) 300 MG capsule TAKE 2 CAPSULES BY MOUTH 2 TIMES DAILY. 120 capsule 3   HUMALOG MIX 75/25 KWIKPEN (75-25) 100 UNIT/ML Kwikpen SMARTSIG:20 Unit(s) SUB-Q Twice Daily     HYDROcodone -acetaminophen  (NORCO) 7.5-325 MG tablet Take 1 tablet by mouth every 6 (six) hours as needed for moderate pain. 30 tablet 0   JARDIANCE 25 MG TABS tablet Take 25 mg by mouth daily.     L-Methylfolate 15 MG TABS TAKE 1 TABLET BY MOUTH EVERY DAY **NOT COVERED BY  INSURANCE**     LAVENDER OIL PO Take by mouth.     lisinopril-hydrochlorothiazide (ZESTORETIC) 20-12.5 MG tablet Take 1 tablet by mouth daily.     lumateperone tosylate (CAPLYTA) 42 MG capsule Take 42 mg by mouth daily.     pantoprazole  (PROTONIX ) 40 MG tablet TAKE TWO TABLETS EVERY MORNING AS DIRECTED. 60 tablet 0   pramipexole  (MIRAPEX ) 1.5 MG tablet Take 1.5 mg by mouth daily.     predniSONE  (DELTASONE ) 20 MG tablet Take 1 tablet (20 mg total) by mouth daily with breakfast. 6 tablet 0   simvastatin (ZOCOR) 40 MG tablet 1 tablet in the evening     SUMAtriptan  (IMITREX ) 100 MG  tablet Take 1 tablet (100 mg total) by mouth every 2 (two) hours as needed for migraine. May repeat in 2 hours if headache persists or recurs. 10 tablet 2   SYMBICORT  160-4.5 MCG/ACT inhaler INHALE TWO PUFFS TWICE DAILY TO PREVENT COUGH OR WHEEZE. RINSE MOUTH AFTER USE. 10.2 each 0   TRINTELLIX  20 MG TABS tablet Take 20 mg by mouth daily.     zolpidem (AMBIEN) 10 MG tablet Take 10 mg by mouth at bedtime as needed.     No current facility-administered medications for this visit.    Medication Side Effects: None  Allergies:  Allergies  Allergen Reactions   Atorvastatin Other (See Comments)    Muscle soreness  Muscle soreness  Muscle soreness  Muscle soreness, Muscle soreness   Budesonide-Formoterol Fumarate Other (See Comments)    Tongue/throat soreness   Metformin Hcl Diarrhea    Other reaction(s): Diarhea    Past Medical History:  Diagnosis Date   Allergic rhinitis    Allergy    Anxiety    Asthma    Depression    Diabetes mellitus (HCC)    Dyslipidemia    Fatigue    GERD (gastroesophageal reflux disease)    Headache    Hemorrhoids    Hyperlipidemia    Hypertension    Hypoxia    IBS (irritable bowel syndrome)    Low testosterone    Prostatitis    Sleep apnea    uses CPAP   Sleep apnea with hypersomnolence    Vitamin D deficiency     Past Medical History, Surgical history, Social history, and Family history were reviewed and updated as appropriate.   Please see review of systems for further details on the patient's review from today.   Objective:   Physical Exam:  There were no vitals taken for this visit.  Physical Exam Constitutional:      General: He is not in acute distress. Musculoskeletal:        General: No deformity.  Neurological:     Mental Status: He is alert and oriented to person, place, and time.     Coordination: Coordination normal.  Psychiatric:        Attention and Perception: Attention and perception normal. He does not perceive  auditory or visual hallucinations.        Mood and Affect: Mood normal. Mood is not anxious or depressed. Affect is not labile, blunt, angry or inappropriate.        Speech: Speech normal.        Behavior: Behavior normal.        Thought Content: Thought content normal. Thought content is not paranoid or delusional. Thought content does not include homicidal or suicidal ideation. Thought content does not include homicidal or suicidal plan.        Cognition  and Memory: Cognition and memory normal.        Judgment: Judgment normal.     Comments: Insight intact     Lab Review:  No results found for: NA, K, CL, CO2, GLUCOSE, BUN, CREATININE, CALCIUM, PROT, ALBUMIN, AST, ALT, ALKPHOS, BILITOT, GFRNONAA, GFRAA     Component Value Date/Time   WBC 6.3 04/08/2018 0811   RBC 5.48 04/08/2018 0811   HGB 16.1 04/08/2018 0811   HCT 47.4 04/08/2018 0811   PLT 165 04/08/2018 0811   MCV 87 04/08/2018 0811   MCH 29.4 04/08/2018 0811   MCHC 34.0 04/08/2018 0811   RDW 14.2 04/08/2018 0811   LYMPHSABS 1.1 04/08/2018 0811   EOSABS 0.3 04/08/2018 0811   BASOSABS 0.1 04/08/2018 0811    No results found for: POCLITH, LITHIUM   No results found for: PHENYTOIN, PHENOBARB, VALPROATE, CBMZ   .res Assessment: Plan:    Treatment Plan/Recommendations:   Plan:  PDMP reviewed  Trintellix  20mg  daily - 3 refills Doxepin  10mg  at hs - 3 refills  Xanax  0.5mg  at hs for sleep - script sent  Increase Buspar  10mg  BID to 20mg  BID  Mirapex  1.5mg  daily - plans to taper off  RTC 8 weeks  Patient advised to contact office with any questions, adverse effects, or acute worsening in signs and symptoms.   Discussed potential benefits, risk, and side effects of benzodiazepines to include potential risk of tolerance and dependence, as well as possible drowsiness.  Advised patient not to drive if experiencing drowsiness and to take lowest possible effective dose to  minimize risk of dependence and tolerance.   There are no diagnoses linked to this encounter.   Please see After Visit Summary for patient specific instructions.  No future appointments.  No orders of the defined types were placed in this encounter.   -------------------------------

## 2023-03-04 ENCOUNTER — Ambulatory Visit: Payer: 59 | Admitting: Orthopaedic Surgery

## 2023-03-12 ENCOUNTER — Other Ambulatory Visit: Payer: Self-pay | Admitting: Adult Health

## 2023-03-12 DIAGNOSIS — F429 Obsessive-compulsive disorder, unspecified: Secondary | ICD-10-CM

## 2023-03-12 DIAGNOSIS — F411 Generalized anxiety disorder: Secondary | ICD-10-CM

## 2023-03-16 ENCOUNTER — Ambulatory Visit (INDEPENDENT_AMBULATORY_CARE_PROVIDER_SITE_OTHER): Payer: 59 | Admitting: Orthopaedic Surgery

## 2023-03-16 DIAGNOSIS — M25461 Effusion, right knee: Secondary | ICD-10-CM

## 2023-03-16 DIAGNOSIS — Z9889 Other specified postprocedural states: Secondary | ICD-10-CM | POA: Diagnosis not present

## 2023-03-16 DIAGNOSIS — M25561 Pain in right knee: Secondary | ICD-10-CM | POA: Diagnosis not present

## 2023-03-16 DIAGNOSIS — G8929 Other chronic pain: Secondary | ICD-10-CM | POA: Diagnosis not present

## 2023-03-16 NOTE — Progress Notes (Signed)
The patient is a 58 year old who returns for follow-up visit relates to his right knee.  We performed arthroscopic surgery in his knee about 4 months ago.  This was due to recurrent effusions.  When we got into surgery we found a medial plica of the remainder of his knee look good inside.  He continues to have recurrent effusions and still pain with his knee.  He did see his primary care physician for blood draw he was recently able to find those results this week in terms of his blood glucose control and kidney disease.  He cannot take regular anti-inflammatories.  Examination of his right knee today continues to show a knee effusion.  There is slight varus malalignment and pain throughout the arc of motion of his knee and the effusion keeps him from fully flexing the knee.  I would like to send him to my partner Dr. Steward Drone for another opinion as it relates to the patient's right knee.  I did look at the arthroscopy pictures today again this see that he has good looking cartilage in his knee.  I am at a loss of what else that we can try for him and he is definitely still feeling issues as a relates to the right knee and really not any other joint complaints.  We will see if my partner thinks in terms of a critical assessment of the patient's right knee.  The patient also agrees with this referral.

## 2023-03-23 ENCOUNTER — Ambulatory Visit: Payer: 59 | Admitting: Orthopaedic Surgery

## 2023-03-25 ENCOUNTER — Ambulatory Visit (HOSPITAL_BASED_OUTPATIENT_CLINIC_OR_DEPARTMENT_OTHER): Payer: 59 | Admitting: Orthopaedic Surgery

## 2023-03-25 DIAGNOSIS — S83231D Complex tear of medial meniscus, current injury, right knee, subsequent encounter: Secondary | ICD-10-CM | POA: Diagnosis not present

## 2023-03-25 DIAGNOSIS — G8929 Other chronic pain: Secondary | ICD-10-CM

## 2023-03-25 DIAGNOSIS — M25561 Pain in right knee: Secondary | ICD-10-CM | POA: Diagnosis not present

## 2023-03-25 NOTE — Progress Notes (Signed)
Chief Complaint: Right knee pain     History of Present Illness:    Donald Payne is a 58 y.o. male presents today with ongoing right knee pain.  He is status post right knee arthroscopy in October 2024 with Dr. Magnus Ivan.  He had had a plica excision at that time.  Postop he did have some persistent pain and swelling.  Dr. Magnus Ivan did perform an aspiration as well as an injection which she got some relief from.  He was in a hinged knee brace following this.  At this time he is having a very difficult time flexing beyond 90 degrees.    PMH/PSH/Family History/Social History/Meds/Allergies:    Past Medical History:  Diagnosis Date   Allergic rhinitis    Allergy    Anxiety    Asthma    Depression    Diabetes mellitus (HCC)    Dyslipidemia    Fatigue    GERD (gastroesophageal reflux disease)    Headache    Hemorrhoids    Hyperlipidemia    Hypertension    Hypoxia    IBS (irritable bowel syndrome)    Low testosterone    Prostatitis    Sleep apnea    uses CPAP   Sleep apnea with hypersomnolence    Vitamin D deficiency    Past Surgical History:  Procedure Laterality Date   WISDOM TOOTH EXTRACTION     Social History   Socioeconomic History   Marital status: Married    Spouse name: [name]   Number of children: 0   Years of education: Not on file   Highest education level: Master's degree (e.g., MA, MS, MEng, MEd, MSW, MBA)  Occupational History   Occupation: Field seismologist  Tobacco Use   Smoking status: Never   Smokeless tobacco: Never  Vaping Use   Vaping status: Never Used  Substance and Sexual Activity   Alcohol use: Not Currently   Drug use: No   Sexual activity: Not on file  Other Topics Concern   Not on file  Social History Narrative   Lives with wife   Caffeine- 3-4 diet sodas a day   Social Drivers of Corporate investment banker Strain: Not on file  Food Insecurity: Not on file  Transportation Needs: Not on file  Physical Activity: Not on  file  Stress: Not on file  Social Connections: Not on file   Family History  Problem Relation Age of Onset   Migraines Mother    Colon cancer Mother    Hypertension Mother    Diabetes Mother    Heart disease Father    Diabetes Paternal Grandmother    Heart disease Paternal Grandmother    Prostate cancer Paternal Grandfather    Healthy Other    Coronary artery disease Other        Deceased age 62   Esophageal cancer Neg Hx    Rectal cancer Neg Hx    Stomach cancer Neg Hx    Allergies  Allergen Reactions   Atorvastatin Other (See Comments)    Muscle soreness  Muscle soreness  Muscle soreness  Muscle soreness, Muscle soreness   Budesonide-Formoterol Fumarate Other (See Comments)    Tongue/throat soreness   Metformin Hcl Diarrhea    Other reaction(s): Diarhea   Current Outpatient Medications  Medication Sig Dispense Refill   albuterol (PROVENTIL) (2.5 MG/3ML) 0.083% nebulizer solution Take 2.5 mg by nebulization 4 (four) times daily as needed.     albuterol (VENTOLIN HFA) 108 (90  Base) MCG/ACT inhaler Can inhale two puffs every four to six hours as needed for cough or wheeze. 18 g 0   ALPRAZolam (XANAX) 0.5 MG tablet Take 1 tablet (0.5 mg total) by mouth at bedtime as needed for anxiety. 30 tablet 1   Ascorbic Acid (VITAMIN C PO) Take by mouth daily.     azelastine (ASTELIN) 0.1 % nasal spray Can use one to two sprays in each nostril one to two times daily as directed. 30 mL 5   busPIRone (BUSPAR) 10 MG tablet TAKE 2 TABLETS BY MOUTH TWICE A DAY 120 tablet 0   celecoxib (CELEBREX) 200 MG capsule TAKE 1 CAPSULE (200 MG TOTAL) BY MOUTH 2 (TWO) TIMES DAILY BETWEEN MEALS AS NEEDED. 60 capsule 1   Cholecalciferol (VITAMIN D) 50 MCG (2000 UT) CAPS 1 capsule     doxepin (SINEQUAN) 10 MG capsule 1 capsule at bedtime     Erenumab-aooe (AIMOVIG) 70 MG/ML SOAJ INJECT 70 MG INTO THE SKIN EVERY 30 DAYS 1 mL 1   famotidine (PEPCID) 40 MG tablet TAKE ONE TABLET ONCE EACH EVENING 30 tablet  0   gabapentin (NEURONTIN) 300 MG capsule TAKE 2 CAPSULES BY MOUTH 2 TIMES DAILY. 120 capsule 3   HUMALOG MIX 75/25 KWIKPEN (75-25) 100 UNIT/ML Kwikpen SMARTSIG:20 Unit(s) SUB-Q Twice Daily     HYDROcodone-acetaminophen (NORCO) 7.5-325 MG tablet Take 1 tablet by mouth every 6 (six) hours as needed for moderate pain. 30 tablet 0   JARDIANCE 25 MG TABS tablet Take 25 mg by mouth daily.     L-Methylfolate 15 MG TABS TAKE 1 TABLET BY MOUTH EVERY DAY **NOT COVERED BY INSURANCE**     LAVENDER OIL PO Take by mouth.     lisinopril-hydrochlorothiazide (ZESTORETIC) 20-12.5 MG tablet Take 1 tablet by mouth daily.     pantoprazole (PROTONIX) 40 MG tablet TAKE TWO TABLETS EVERY MORNING AS DIRECTED. 60 tablet 0   pramipexole (MIRAPEX) 1.5 MG tablet Take 1 tablet (1.5 mg total) by mouth daily. 45 tablet 1   predniSONE (DELTASONE) 20 MG tablet Take 1 tablet (20 mg total) by mouth daily with breakfast. 6 tablet 0   simvastatin (ZOCOR) 40 MG tablet 1 tablet in the evening     SUMAtriptan (IMITREX) 100 MG tablet Take 1 tablet (100 mg total) by mouth every 2 (two) hours as needed for migraine. May repeat in 2 hours if headache persists or recurs. 10 tablet 2   SYMBICORT 160-4.5 MCG/ACT inhaler INHALE TWO PUFFS TWICE DAILY TO PREVENT COUGH OR WHEEZE. RINSE MOUTH AFTER USE. 10.2 each 0   TRINTELLIX 20 MG TABS tablet Take 20 mg by mouth daily.     zolpidem (AMBIEN) 10 MG tablet Take 10 mg by mouth at bedtime as needed.     No current facility-administered medications for this visit.   No results found.  Review of Systems:   A ROS was performed including pertinent positives and negatives as documented in the HPI.  Physical Exam :   Constitutional: NAD and appears stated age Neurological: Alert and oriented Psych: Appropriate affect and cooperative There were no vitals taken for this visit.   Comprehensive Musculoskeletal Exam:      Musculoskeletal Exam  Gait Normal  Alignment Normal   Right Left   Inspection Normal Normal  Palpation    Tenderness patellofemoral, posterior knee None  Crepitus None None  Effusion Positive None  Range of Motion    Extension 0 0  Flexion 135 135  Strength  Extension 5/5 5/5  Flexion 5/5 5/5  Ligament Exam     Generalized Laxity No No  Lachman Negative Negative   Pivot Shift Negative Negative  Anterior Drawer Negative Negative  Valgus at 0 Negative Negative  Valgus at 20 Negative Negative  Varus at 0 0 0  Varus at 20   0 0  Posterior Drawer at 90 0 0  Vascular/Lymphatic Exam    Edema None None  Venous Stasis Changes No No  Distal Circulation Normal Normal  Neurologic    Light Touch Sensation Intact Intact  Special Tests:       Imaging:     I personally reviewed and interpreted the radiographs.   Assessment and Plan:   58 y.o. male presents with persistent right knee pain.  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  That being said I do believe an MRI would be helpful to rule out any type of additional pathology.  Will plan to proceed with this and I will see him back following discuss results  -Plan for MRI right knee and follow-up discuss result   I personally saw and evaluated the patient, and participated in the management and treatment plan.  Huel Cote, MD Attending Physician, Orthopedic Surgery  This document was dictated using Dragon voice recognition software. A reasonable attempt at proof reading has been made to minimize errors.

## 2023-04-03 ENCOUNTER — Other Ambulatory Visit: Payer: Self-pay | Admitting: Orthopaedic Surgery

## 2023-04-03 DIAGNOSIS — G8929 Other chronic pain: Secondary | ICD-10-CM

## 2023-04-09 ENCOUNTER — Encounter: Payer: Self-pay | Admitting: Adult Health

## 2023-04-09 ENCOUNTER — Telehealth (INDEPENDENT_AMBULATORY_CARE_PROVIDER_SITE_OTHER): Payer: 59 | Admitting: Adult Health

## 2023-04-09 ENCOUNTER — Telehealth (HOSPITAL_BASED_OUTPATIENT_CLINIC_OR_DEPARTMENT_OTHER): Payer: Self-pay | Admitting: Orthopaedic Surgery

## 2023-04-09 DIAGNOSIS — F429 Obsessive-compulsive disorder, unspecified: Secondary | ICD-10-CM

## 2023-04-09 DIAGNOSIS — G4700 Insomnia, unspecified: Secondary | ICD-10-CM | POA: Diagnosis not present

## 2023-04-09 DIAGNOSIS — F411 Generalized anxiety disorder: Secondary | ICD-10-CM

## 2023-04-09 DIAGNOSIS — F331 Major depressive disorder, recurrent, moderate: Secondary | ICD-10-CM | POA: Diagnosis not present

## 2023-04-09 MED ORDER — BUSPIRONE HCL 10 MG PO TABS
20.0000 mg | ORAL_TABLET | Freq: Two times a day (BID) | ORAL | 0 refills | Status: DC
Start: 2023-04-09 — End: 2023-05-15

## 2023-04-09 MED ORDER — ALPRAZOLAM 0.5 MG PO TABS
0.5000 mg | ORAL_TABLET | Freq: Every evening | ORAL | 1 refills | Status: DC | PRN
Start: 2023-04-09 — End: 2023-06-17

## 2023-04-09 NOTE — Progress Notes (Signed)
 Donald Payne [id number] 1965/09/08 58 y.o.  Virtual Visit via Video Note  I connected with pt @ on 04/09/23 at  5:00 PM EST by a video enabled telemedicine application and verified that I am speaking with the correct person using two identifiers.   I discussed the limitations of evaluation and management by telemedicine and the availability of in person appointments. The patient expressed understanding and agreed to proceed.  I discussed the assessment and treatment plan with the patient. The patient was provided an opportunity to ask questions and all were answered. The patient agreed with the plan and demonstrated an understanding of the instructions.   The patient was advised to call back or seek an in-person evaluation if the symptoms worsen or if the condition fails to improve as anticipated.  I provided 25 minutes of non-face-to-face time during this encounter.  The patient was located at home.  The provider was located at [facility].   Donald Gibbs, NP   Subjective:   Patient ID:  Donald Payne is a 58 y.o. (DOB 14-Feb-1965) male.  Chief Complaint: No chief complaint on file.   HPI Donald Payne presents for follow-up of MDD, GAD, OCD, and insomnia.  Most recently seen at the [facility].   Describes mood today as "ok". Pleasant. Denies tearfulness. Mood symptoms - reports some situational depression. Reports stable interest and motivation. Reports anxiety - "a little bit". Reports irritability at times. Denies recent panic attacks. Reports some worry, rumination and over thinking - "a little bit better". Reports some situational stressors - worried about wife. Mood is "pretty decent". Stating "I feel like I'm doing ok". Taking medications as prescribed.  Energy levels lower - "not quite as good". Active, does not have a regular exercise routine.  Enjoys some usual interests and activities. Married. Lives with wife of 8 to 9 years. Wife is a  traveling Engineer, civil (consulting). No children - stepchildren. Spending time with family. Appetite adequate. Weight 245 to 250 pounds. Sleeps well most nights. Averages 7 to 8 hours. Focus and concentration stable. Completing tasks. Managing aspects of household. Retired - [employer].  Denies SI or HI.  Denies AH or VH. Denies self harm.  Denies substance use.  Previous medication trials: Ambien, Lithium, Caplyta and others.     Review of Systems:  Review of Systems  Musculoskeletal:  Negative for gait problem.  Neurological:  Negative for tremors.  Psychiatric/Behavioral:         Please refer to HPI    Medications: I have reviewed the patient's current medications.  Current Outpatient Medications  Medication Sig Dispense Refill   albuterol (PROVENTIL) (2.5 MG/3ML) 0.083% nebulizer solution Take 2.5 mg by nebulization 4 (four) times daily as needed.     albuterol (VENTOLIN HFA) 108 (90 Base) MCG/ACT inhaler Can inhale two puffs every four to six hours as needed for cough or wheeze. 18 g 0   ALPRAZolam (XANAX) 0.5 MG tablet Take 1 tablet (0.5 mg total) by mouth at bedtime as needed for anxiety. 30 tablet 1   Ascorbic Acid (VITAMIN C PO) Take by mouth daily.     azelastine (ASTELIN) 0.1 % nasal spray Can use one to two sprays in each nostril one to two times daily as directed. 30 mL 5   busPIRone (BUSPAR) 10 MG tablet Take 2 tablets (20 mg total) by mouth 2 (two) times daily. 120 tablet 0   celecoxib (CELEBREX) 200 MG capsule TAKE 1 CAPSULE (200 MG TOTAL) BY  MOUTH 2 (TWO) TIMES DAILY BETWEEN MEALS AS NEEDED. 60 capsule 1   Cholecalciferol (VITAMIN D) 50 MCG (2000 UT) CAPS 1 capsule     doxepin (SINEQUAN) 10 MG capsule 1 capsule at bedtime     Erenumab-aooe (AIMOVIG) 70 MG/ML SOAJ INJECT 70 MG INTO THE SKIN EVERY 30 DAYS 1 mL 1   famotidine (PEPCID) 40 MG tablet TAKE ONE TABLET ONCE EACH EVENING 30 tablet 0   gabapentin (NEURONTIN) 300 MG capsule TAKE 2 CAPSULES BY MOUTH 2 TIMES DAILY. 120 capsule  3   HUMALOG MIX 75/25 KWIKPEN (75-25) 100 UNIT/ML Kwikpen SMARTSIG:20 Unit(s) SUB-Q Twice Daily     HYDROcodone-acetaminophen (NORCO) 7.5-325 MG tablet Take 1 tablet by mouth every 6 (six) hours as needed for moderate pain. 30 tablet 0   JARDIANCE 25 MG TABS tablet Take 25 mg by mouth daily.     L-Methylfolate 15 MG TABS TAKE 1 TABLET BY MOUTH EVERY DAY **NOT COVERED BY INSURANCE**     LAVENDER OIL PO Take by mouth.     lisinopril-hydrochlorothiazide (ZESTORETIC) 20-12.5 MG tablet Take 1 tablet by mouth daily.     pantoprazole (PROTONIX) 40 MG tablet TAKE TWO TABLETS EVERY MORNING AS DIRECTED. 60 tablet 0   pramipexole (MIRAPEX) 1.5 MG tablet Take 1 tablet (1.5 mg total) by mouth daily. 45 tablet 1   predniSONE (DELTASONE) 20 MG tablet Take 1 tablet (20 mg total) by mouth daily with breakfast. 6 tablet 0   simvastatin (ZOCOR) 40 MG tablet 1 tablet in the evening     SUMAtriptan (IMITREX) 100 MG tablet Take 1 tablet (100 mg total) by mouth every 2 (two) hours as needed for migraine. May repeat in 2 hours if headache persists or recurs. 10 tablet 2   SYMBICORT 160-4.5 MCG/ACT inhaler INHALE TWO PUFFS TWICE DAILY TO PREVENT COUGH OR WHEEZE. RINSE MOUTH AFTER USE. 10.2 each 0   TRINTELLIX 20 MG TABS tablet Take 20 mg by mouth daily.     zolpidem (AMBIEN) 10 MG tablet Take 10 mg by mouth at bedtime as needed.     No current facility-administered medications for this visit.    Medication Side Effects: None  Allergies:  Allergies  Allergen Reactions   Atorvastatin Other (See Comments)    Muscle soreness  Muscle soreness  Muscle soreness  Muscle soreness, Muscle soreness   Budesonide-Formoterol Fumarate Other (See Comments)    Tongue/throat soreness   Metformin Hcl Diarrhea    Other reaction(s): Diarhea    Past Medical History:  Diagnosis Date   Allergic rhinitis    Allergy    Anxiety    Asthma    Depression    Diabetes mellitus (HCC)    Dyslipidemia    Fatigue    GERD  (gastroesophageal reflux disease)    Headache    Hemorrhoids    Hyperlipidemia    Hypertension    Hypoxia    IBS (irritable bowel syndrome)    Low testosterone    Prostatitis    Sleep apnea    uses CPAP   Sleep apnea with hypersomnolence    Vitamin D deficiency     Family History  Problem Relation Age of Onset   Migraines Mother    Colon cancer Mother    Hypertension Mother    Diabetes Mother    Heart disease Father    Diabetes Paternal Grandmother    Heart disease Paternal Grandmother    Prostate cancer Paternal Grandfather    Healthy Other    Coronary  artery disease Other        Deceased age 25   Esophageal cancer Neg Hx    Rectal cancer Neg Hx    Stomach cancer Neg Hx     Social History   Socioeconomic History   Marital status: Married    Spouse name: [name]   Number of children: 0   Years of education: Not on file   Highest education level: Master's degree (e.g., MA, MS, MEng, MEd, MSW, MBA)  Occupational History   Occupation: Field seismologist  Tobacco Use   Smoking status: Never   Smokeless tobacco: Never  Vaping Use   Vaping status: Never Used  Substance and Sexual Activity   Alcohol use: Not Currently   Drug use: No   Sexual activity: Not on file  Other Topics Concern   Not on file  Social History Narrative   Lives with wife   Caffeine- 3-4 diet sodas a day   Social Drivers of Corporate investment banker Strain: Not on file  Food Insecurity: Not on file  Transportation Needs: Not on file  Physical Activity: Not on file  Stress: Not on file  Social Connections: Not on file  Intimate Partner Violence: Not on file    Past Medical History, Surgical history, Social history, and Family history were reviewed and updated as appropriate.   Please see review of systems for further details on the patient's review from today.   Objective:   Physical Exam:  There were no vitals taken for this visit.  Physical Exam Constitutional:      General: He  is not in acute distress. Musculoskeletal:        General: No deformity.  Neurological:     Mental Status: He is alert and oriented to person, place, and time.     Coordination: Coordination normal.  Psychiatric:        Attention and Perception: Attention and perception normal. He does not perceive auditory or visual hallucinations.        Mood and Affect: Mood normal. Mood is not anxious or depressed. Affect is not labile, blunt, angry or inappropriate.        Speech: Speech normal.        Behavior: Behavior normal.        Thought Content: Thought content normal. Thought content is not paranoid or delusional. Thought content does not include homicidal or suicidal ideation. Thought content does not include homicidal or suicidal plan.        Cognition and Memory: Cognition and memory normal.        Judgment: Judgment normal.     Comments: Insight intact     Lab Review:  No results found for: "NA", "K", "CL", "CO2", "GLUCOSE", "BUN", "CREATININE", "CALCIUM", "PROT", "ALBUMIN", "AST", "ALT", "ALKPHOS", "BILITOT", "GFRNONAA", "GFRAA"     Component Value Date/Time   WBC 6.3 04/08/2018 0811   RBC 5.48 04/08/2018 0811   HGB 16.1 04/08/2018 0811   HCT 47.4 04/08/2018 0811   PLT 165 04/08/2018 0811   MCV 87 04/08/2018 0811   MCH 29.4 04/08/2018 0811   MCHC 34.0 04/08/2018 0811   RDW 14.2 04/08/2018 0811   LYMPHSABS 1.1 04/08/2018 0811   EOSABS 0.3 04/08/2018 0811   BASOSABS 0.1 04/08/2018 0811    No results found for: "POCLITH", "LITHIUM"   No results found for: "PHENYTOIN", "PHENOBARB", "VALPROATE", "CBMZ"   .res Assessment: Plan:   Treatment Plan/Recommendations:   Plan:  PDMP reviewed  Trintellix 20mg  daily Doxepin 10mg  at  hs   Xanax 0.5mg  at hs for sleep   Buspar 20mg  BID  Mirapex 1.5mg  daily - plans to taper off  RTC 3 months  Patient advised to contact office with any questions, adverse effects, or acute worsening in signs and symptoms.   Discussed potential  benefits, risk, and side effects of benzodiazepines to include potential risk of tolerance and dependence, as well as possible drowsiness.  Advised patient not to drive if experiencing drowsiness and to take lowest possible effective dose to minimize risk of dependence and tolerance.   Diagnoses and all orders for this visit:  Major depressive disorder, recurrent episode, moderate (HCC)  Generalized anxiety disorder -     ALPRAZolam (XANAX) 0.5 MG tablet; Take 1 tablet (0.5 mg total) by mouth at bedtime as needed for anxiety. -     busPIRone (BUSPAR) 10 MG tablet; Take 2 tablets (20 mg total) by mouth 2 (two) times daily.  Obsessive-compulsive disorder, unspecified type -     busPIRone (BUSPAR) 10 MG tablet; Take 2 tablets (20 mg total) by mouth 2 (two) times daily.  Insomnia, unspecified type     Please see After Visit Summary for patient specific instructions.  Future Appointments  Date Time Provider Department Center  04/10/2023  3:00 PM [facility]  04/22/2023  4:15 PM Huel Cote, MD [facility]    No orders of the defined types were placed in this encounter.     -------------------------------

## 2023-04-09 NOTE — Telephone Encounter (Signed)
 Patient wants to know Dr B has enough information from the MRI because he does not want to pay $900 dollars for a second one.Please contact patient [phone]

## 2023-04-10 ENCOUNTER — Ambulatory Visit (HOSPITAL_BASED_OUTPATIENT_CLINIC_OR_DEPARTMENT_OTHER): Payer: 59

## 2023-04-13 ENCOUNTER — Ambulatory Visit (HOSPITAL_BASED_OUTPATIENT_CLINIC_OR_DEPARTMENT_OTHER): Payer: 59 | Admitting: Orthopaedic Surgery

## 2023-04-13 ENCOUNTER — Ambulatory Visit (HOSPITAL_BASED_OUTPATIENT_CLINIC_OR_DEPARTMENT_OTHER): Payer: Self-pay | Admitting: Orthopaedic Surgery

## 2023-04-13 DIAGNOSIS — M25561 Pain in right knee: Secondary | ICD-10-CM

## 2023-04-13 DIAGNOSIS — G8929 Other chronic pain: Secondary | ICD-10-CM | POA: Diagnosis not present

## 2023-04-13 DIAGNOSIS — S83231D Complex tear of medial meniscus, current injury, right knee, subsequent encounter: Secondary | ICD-10-CM | POA: Diagnosis not present

## 2023-04-13 NOTE — H&P (View-Only) (Signed)
 Chief Complaint: Right knee pain     History of Present Illness:   04/13/2023: Presents today for follow-up of the right knee.  Donald Payne is a 58 y.o. male presents today with ongoing right knee pain.  He is status post right knee arthroscopy in October 2024 with Dr. Magnus Ivan.  He had had a plica excision at that time.  Postop he did have some persistent pain and swelling.  Dr. Magnus Ivan did perform an aspiration as well as an injection which she got some relief from.  He was in a hinged knee brace following this.  At this time he is having a very difficult time flexing beyond 90 degrees.    PMH/PSH/Family History/Social History/Meds/Allergies:    Past Medical History:  Diagnosis Date   Allergic rhinitis    Allergy    Anxiety    Asthma    Depression    Diabetes mellitus (HCC)    Dyslipidemia    Fatigue    GERD (gastroesophageal reflux disease)    Headache    Hemorrhoids    Hyperlipidemia    Hypertension    Hypoxia    IBS (irritable bowel syndrome)    Low testosterone    Prostatitis    Sleep apnea    uses CPAP   Sleep apnea with hypersomnolence    Vitamin D deficiency    Past Surgical History:  Procedure Laterality Date   WISDOM TOOTH EXTRACTION     Social History   Socioeconomic History   Marital status: Married    Spouse name: [name]   Number of children: 0   Years of education: Not on file   Highest education level: Master's degree (e.g., MA, MS, MEng, MEd, MSW, MBA)  Occupational History   Occupation: Field seismologist  Tobacco Use   Smoking status: Never   Smokeless tobacco: Never  Vaping Use   Vaping status: Never Used  Substance and Sexual Activity   Alcohol use: Not Currently   Drug use: No   Sexual activity: Not on file  Other Topics Concern   Not on file  Social History Narrative   Lives with wife   Caffeine- 3-4 diet sodas a day   Social Drivers of Corporate investment banker Strain: Not on file  Food Insecurity: Not on file   Transportation Needs: Not on file  Physical Activity: Not on file  Stress: Not on file  Social Connections: Not on file   Family History  Problem Relation Age of Onset   Migraines Mother    Colon cancer Mother    Hypertension Mother    Diabetes Mother    Heart disease Father    Diabetes Paternal Grandmother    Heart disease Paternal Grandmother    Prostate cancer Paternal Grandfather    Healthy Other    Coronary artery disease Other        Deceased age 3   Esophageal cancer Neg Hx    Rectal cancer Neg Hx    Stomach cancer Neg Hx    Allergies  Allergen Reactions   Atorvastatin Other (See Comments)    Muscle soreness  Muscle soreness  Muscle soreness  Muscle soreness, Muscle soreness   Budesonide-Formoterol Fumarate Other (See Comments)    Tongue/throat soreness   Metformin Hcl Diarrhea    Other reaction(s): Diarhea   Current Outpatient Medications  Medication Sig Dispense Refill   albuterol (PROVENTIL) (2.5 MG/3ML) 0.083% nebulizer solution Take 2.5 mg by nebulization 4 (four) times daily as needed.  albuterol (VENTOLIN HFA) 108 (90 Base) MCG/ACT inhaler Can inhale two puffs every four to six hours as needed for cough or wheeze. 18 g 0   ALPRAZolam (XANAX) 0.5 MG tablet Take 1 tablet (0.5 mg total) by mouth at bedtime as needed for anxiety. 30 tablet 1   Ascorbic Acid (VITAMIN C PO) Take by mouth daily.     azelastine (ASTELIN) 0.1 % nasal spray Can use one to two sprays in each nostril one to two times daily as directed. 30 mL 5   busPIRone (BUSPAR) 10 MG tablet Take 2 tablets (20 mg total) by mouth 2 (two) times daily. 120 tablet 0   celecoxib (CELEBREX) 200 MG capsule TAKE 1 CAPSULE (200 MG TOTAL) BY MOUTH 2 (TWO) TIMES DAILY BETWEEN MEALS AS NEEDED. 60 capsule 1   Cholecalciferol (VITAMIN D) 50 MCG (2000 UT) CAPS 1 capsule     doxepin (SINEQUAN) 10 MG capsule 1 capsule at bedtime     Erenumab-aooe (AIMOVIG) 70 MG/ML SOAJ INJECT 70 MG INTO THE SKIN EVERY 30 DAYS  1 mL 1   famotidine (PEPCID) 40 MG tablet TAKE ONE TABLET ONCE EACH EVENING 30 tablet 0   gabapentin (NEURONTIN) 300 MG capsule TAKE 2 CAPSULES BY MOUTH 2 TIMES DAILY. 120 capsule 3   HUMALOG MIX 75/25 KWIKPEN (75-25) 100 UNIT/ML Kwikpen SMARTSIG:20 Unit(s) SUB-Q Twice Daily     HYDROcodone-acetaminophen (NORCO) 7.5-325 MG tablet Take 1 tablet by mouth every 6 (six) hours as needed for moderate pain. 30 tablet 0   JARDIANCE 25 MG TABS tablet Take 25 mg by mouth daily.     L-Methylfolate 15 MG TABS TAKE 1 TABLET BY MOUTH EVERY DAY **NOT COVERED BY INSURANCE**     LAVENDER OIL PO Take by mouth.     lisinopril-hydrochlorothiazide (ZESTORETIC) 20-12.5 MG tablet Take 1 tablet by mouth daily.     pantoprazole (PROTONIX) 40 MG tablet TAKE TWO TABLETS EVERY MORNING AS DIRECTED. 60 tablet 0   pramipexole (MIRAPEX) 1.5 MG tablet Take 1 tablet (1.5 mg total) by mouth daily. 45 tablet 1   predniSONE (DELTASONE) 20 MG tablet Take 1 tablet (20 mg total) by mouth daily with breakfast. 6 tablet 0   simvastatin (ZOCOR) 40 MG tablet 1 tablet in the evening     SUMAtriptan (IMITREX) 100 MG tablet Take 1 tablet (100 mg total) by mouth every 2 (two) hours as needed for migraine. May repeat in 2 hours if headache persists or recurs. 10 tablet 2   SYMBICORT 160-4.5 MCG/ACT inhaler INHALE TWO PUFFS TWICE DAILY TO PREVENT COUGH OR WHEEZE. RINSE MOUTH AFTER USE. 10.2 each 0   TRINTELLIX 20 MG TABS tablet Take 20 mg by mouth daily.     zolpidem (AMBIEN) 10 MG tablet Take 10 mg by mouth at bedtime as needed.     No current facility-administered medications for this visit.   No results found.  Review of Systems:   A ROS was performed including pertinent positives and negatives as documented in the HPI.  Physical Exam :   Constitutional: NAD and appears stated age Neurological: Alert and oriented Psych: Appropriate affect and cooperative There were no vitals taken for this visit.   Comprehensive Musculoskeletal  Exam:      Musculoskeletal Exam  Gait Normal  Alignment Normal   Right Left  Inspection Normal Normal  Palpation    Tenderness patellofemoral, posterior knee None  Crepitus None None  Effusion Positive None  Range of Motion    Extension 0 0  Flexion 135 135  Strength    Extension 5/5 5/5  Flexion 5/5 5/5  Ligament Exam     Generalized Laxity No No  Lachman Negative Negative   Pivot Shift Negative Negative  Anterior Drawer Negative Negative  Valgus at 0 Negative Negative  Valgus at 20 Negative Negative  Varus at 0 0 0  Varus at 20   0 0  Posterior Drawer at 90 0 0  Vascular/Lymphatic Exam    Edema None None  Venous Stasis Changes No No  Distal Circulation Normal Normal  Neurologic    Light Touch Sensation Intact Intact  Special Tests:       Imaging:     I personally reviewed and interpreted the radiographs.   Assessment and Plan:   58 y.o. male presents with persistent right knee pain.  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  At this time I did discuss treatment options.  Given his lack of motion I do ultimately believe he would be a candidate for right knee arthroscopy with medial meniscal repair and lysis of adhesions.  I did discuss the risks and limitations.  He would like to proceed with this.  -Plan for right knee arthroscopy with medial meniscal repair, lysis of adhesions   After a lengthy discussion of treatment options, including risks, benefits, alternatives, complications of surgical and nonsurgical conservative options, the patient elected surgical repair.   The patient  is aware of the material risks  and complications including, but not limited to injury to adjacent structures, neurovascular injury, infection, numbness, bleeding, implant failure, thermal burns, stiffness, persistent pain, failure to heal, disease transmission from allograft, need for further surgery,  dislocation, anesthetic risks, blood clots, risks of death,and others. The probabilities of surgical success and failure discussed with patient given their particular co-morbidities.The time and nature of expected rehabilitation and recovery was discussed.The patient's questions were all answered preoperatively.  No barriers to understanding were noted. I explained the natural history of the disease process and Rx rationale.  I explained to the patient what I considered to be reasonable expectations given their personal situation.  The final treatment plan was arrived at through a shared patient decision making process model.    I personally saw and evaluated the patient, and participated in the management and treatment plan.  Huel Cote, MD Attending Physician, Orthopedic Surgery  This document was dictated using Dragon voice recognition software. A reasonable attempt at proof reading has been made to minimize errors.

## 2023-04-13 NOTE — Progress Notes (Signed)
 Chief Complaint: Right knee pain     History of Present Illness:   04/13/2023: Presents today for follow-up of the right knee.  Donald Payne is a 58 y.o. male presents today with ongoing right knee pain.  He is status post right knee arthroscopy in October 2024 with Dr. Magnus Ivan.  He had had a plica excision at that time.  Postop he did have some persistent pain and swelling.  Dr. Magnus Ivan did perform an aspiration as well as an injection which she got some relief from.  He was in a hinged knee brace following this.  At this time he is having a very difficult time flexing beyond 90 degrees.    PMH/PSH/Family History/Social History/Meds/Allergies:    Past Medical History:  Diagnosis Date   Allergic rhinitis    Allergy    Anxiety    Asthma    Depression    Diabetes mellitus (HCC)    Dyslipidemia    Fatigue    GERD (gastroesophageal reflux disease)    Headache    Hemorrhoids    Hyperlipidemia    Hypertension    Hypoxia    IBS (irritable bowel syndrome)    Low testosterone    Prostatitis    Sleep apnea    uses CPAP   Sleep apnea with hypersomnolence    Vitamin D deficiency    Past Surgical History:  Procedure Laterality Date   WISDOM TOOTH EXTRACTION     Social History   Socioeconomic History   Marital status: Married    Spouse name: [name]   Number of children: 0   Years of education: Not on file   Highest education level: Master's degree (e.g., MA, MS, MEng, MEd, MSW, MBA)  Occupational History   Occupation: Field seismologist  Tobacco Use   Smoking status: Never   Smokeless tobacco: Never  Vaping Use   Vaping status: Never Used  Substance and Sexual Activity   Alcohol use: Not Currently   Drug use: No   Sexual activity: Not on file  Other Topics Concern   Not on file  Social History Narrative   Lives with wife   Caffeine- 3-4 diet sodas a day   Social Drivers of Corporate investment banker Strain: Not on file  Food Insecurity: Not on file   Transportation Needs: Not on file  Physical Activity: Not on file  Stress: Not on file  Social Connections: Not on file   Family History  Problem Relation Age of Onset   Migraines Mother    Colon cancer Mother    Hypertension Mother    Diabetes Mother    Heart disease Father    Diabetes Paternal Grandmother    Heart disease Paternal Grandmother    Prostate cancer Paternal Grandfather    Healthy Other    Coronary artery disease Other        Deceased age 3   Esophageal cancer Neg Hx    Rectal cancer Neg Hx    Stomach cancer Neg Hx    Allergies  Allergen Reactions   Atorvastatin Other (See Comments)    Muscle soreness  Muscle soreness  Muscle soreness  Muscle soreness, Muscle soreness   Budesonide-Formoterol Fumarate Other (See Comments)    Tongue/throat soreness   Metformin Hcl Diarrhea    Other reaction(s): Diarhea   Current Outpatient Medications  Medication Sig Dispense Refill   albuterol (PROVENTIL) (2.5 MG/3ML) 0.083% nebulizer solution Take 2.5 mg by nebulization 4 (four) times daily as needed.  albuterol (VENTOLIN HFA) 108 (90 Base) MCG/ACT inhaler Can inhale two puffs every four to six hours as needed for cough or wheeze. 18 g 0   ALPRAZolam (XANAX) 0.5 MG tablet Take 1 tablet (0.5 mg total) by mouth at bedtime as needed for anxiety. 30 tablet 1   Ascorbic Acid (VITAMIN C PO) Take by mouth daily.     azelastine (ASTELIN) 0.1 % nasal spray Can use one to two sprays in each nostril one to two times daily as directed. 30 mL 5   busPIRone (BUSPAR) 10 MG tablet Take 2 tablets (20 mg total) by mouth 2 (two) times daily. 120 tablet 0   celecoxib (CELEBREX) 200 MG capsule TAKE 1 CAPSULE (200 MG TOTAL) BY MOUTH 2 (TWO) TIMES DAILY BETWEEN MEALS AS NEEDED. 60 capsule 1   Cholecalciferol (VITAMIN D) 50 MCG (2000 UT) CAPS 1 capsule     doxepin (SINEQUAN) 10 MG capsule 1 capsule at bedtime     Erenumab-aooe (AIMOVIG) 70 MG/ML SOAJ INJECT 70 MG INTO THE SKIN EVERY 30 DAYS  1 mL 1   famotidine (PEPCID) 40 MG tablet TAKE ONE TABLET ONCE EACH EVENING 30 tablet 0   gabapentin (NEURONTIN) 300 MG capsule TAKE 2 CAPSULES BY MOUTH 2 TIMES DAILY. 120 capsule 3   HUMALOG MIX 75/25 KWIKPEN (75-25) 100 UNIT/ML Kwikpen SMARTSIG:20 Unit(s) SUB-Q Twice Daily     HYDROcodone-acetaminophen (NORCO) 7.5-325 MG tablet Take 1 tablet by mouth every 6 (six) hours as needed for moderate pain. 30 tablet 0   JARDIANCE 25 MG TABS tablet Take 25 mg by mouth daily.     L-Methylfolate 15 MG TABS TAKE 1 TABLET BY MOUTH EVERY DAY **NOT COVERED BY INSURANCE**     LAVENDER OIL PO Take by mouth.     lisinopril-hydrochlorothiazide (ZESTORETIC) 20-12.5 MG tablet Take 1 tablet by mouth daily.     pantoprazole (PROTONIX) 40 MG tablet TAKE TWO TABLETS EVERY MORNING AS DIRECTED. 60 tablet 0   pramipexole (MIRAPEX) 1.5 MG tablet Take 1 tablet (1.5 mg total) by mouth daily. 45 tablet 1   predniSONE (DELTASONE) 20 MG tablet Take 1 tablet (20 mg total) by mouth daily with breakfast. 6 tablet 0   simvastatin (ZOCOR) 40 MG tablet 1 tablet in the evening     SUMAtriptan (IMITREX) 100 MG tablet Take 1 tablet (100 mg total) by mouth every 2 (two) hours as needed for migraine. May repeat in 2 hours if headache persists or recurs. 10 tablet 2   SYMBICORT 160-4.5 MCG/ACT inhaler INHALE TWO PUFFS TWICE DAILY TO PREVENT COUGH OR WHEEZE. RINSE MOUTH AFTER USE. 10.2 each 0   TRINTELLIX 20 MG TABS tablet Take 20 mg by mouth daily.     zolpidem (AMBIEN) 10 MG tablet Take 10 mg by mouth at bedtime as needed.     No current facility-administered medications for this visit.   No results found.  Review of Systems:   A ROS was performed including pertinent positives and negatives as documented in the HPI.  Physical Exam :   Constitutional: NAD and appears stated age Neurological: Alert and oriented Psych: Appropriate affect and cooperative There were no vitals taken for this visit.   Comprehensive Musculoskeletal  Exam:      Musculoskeletal Exam  Gait Normal  Alignment Normal   Right Left  Inspection Normal Normal  Palpation    Tenderness patellofemoral, posterior knee None  Crepitus None None  Effusion Positive None  Range of Motion    Extension 0 0  Flexion 135 135  Strength    Extension 5/5 5/5  Flexion 5/5 5/5  Ligament Exam     Generalized Laxity No No  Lachman Negative Negative   Pivot Shift Negative Negative  Anterior Drawer Negative Negative  Valgus at 0 Negative Negative  Valgus at 20 Negative Negative  Varus at 0 0 0  Varus at 20   0 0  Posterior Drawer at 90 0 0  Vascular/Lymphatic Exam    Edema None None  Venous Stasis Changes No No  Distal Circulation Normal Normal  Neurologic    Light Touch Sensation Intact Intact  Special Tests:       Imaging:     I personally reviewed and interpreted the radiographs.   Assessment and Plan:   58 y.o. male presents with persistent right knee pain.  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  At this time I did discuss treatment options.  Given his lack of motion I do ultimately believe he would be a candidate for right knee arthroscopy with medial meniscal repair and lysis of adhesions.  I did discuss the risks and limitations.  He would like to proceed with this.  -Plan for right knee arthroscopy with medial meniscal repair, lysis of adhesions   After a lengthy discussion of treatment options, including risks, benefits, alternatives, complications of surgical and nonsurgical conservative options, the patient elected surgical repair.   The patient  is aware of the material risks  and complications including, but not limited to injury to adjacent structures, neurovascular injury, infection, numbness, bleeding, implant failure, thermal burns, stiffness, persistent pain, failure to heal, disease transmission from allograft, need for further surgery,  dislocation, anesthetic risks, blood clots, risks of death,and others. The probabilities of surgical success and failure discussed with patient given their particular co-morbidities.The time and nature of expected rehabilitation and recovery was discussed.The patient's questions were all answered preoperatively.  No barriers to understanding were noted. I explained the natural history of the disease process and Rx rationale.  I explained to the patient what I considered to be reasonable expectations given their personal situation.  The final treatment plan was arrived at through a shared patient decision making process model.    I personally saw and evaluated the patient, and participated in the management and treatment plan.  Huel Cote, MD Attending Physician, Orthopedic Surgery  This document was dictated using Dragon voice recognition software. A reasonable attempt at proof reading has been made to minimize errors.

## 2023-04-21 ENCOUNTER — Other Ambulatory Visit: Payer: Self-pay | Admitting: Orthopaedic Surgery

## 2023-04-21 DIAGNOSIS — S83231D Complex tear of medial meniscus, current injury, right knee, subsequent encounter: Secondary | ICD-10-CM

## 2023-04-22 ENCOUNTER — Ambulatory Visit (HOSPITAL_BASED_OUTPATIENT_CLINIC_OR_DEPARTMENT_OTHER): Payer: 59 | Admitting: Orthopaedic Surgery

## 2023-05-04 ENCOUNTER — Telehealth: Payer: Self-pay | Admitting: Adult Health

## 2023-05-04 MED ORDER — DOXEPIN HCL 10 MG PO CAPS: 10.0000 mg | ORAL_CAPSULE | Freq: Every day | ORAL | 0 refills | Status: DC

## 2023-05-04 NOTE — Telephone Encounter (Signed)
 Sent  Doxepin 10 mg to [facility]

## 2023-05-04 NOTE — Progress Notes (Signed)
 Patient procedure needs to be moved to main OR based on anesthesia guidelines as patient wears oxygen at night. LVM with April at Dr. Serena Croissant office.

## 2023-05-04 NOTE — Telephone Encounter (Signed)
 Donald Payne called at 10:45 to request refill of his Doxepin.  You were going to take over this medication with the others.  Appt 5/7.  Please send to   [id number] - [place], [place] - [address]

## 2023-05-08 NOTE — Progress Notes (Signed)
 Surgical Instructions   Your procedure is scheduled on Tuesday, April 1, 25. Report to [facility] Main Entrance "A" at 12:30 P.M., then check in with the Admitting office. Any questions or running late day of surgery: call [phone]  Questions prior to your surgery date: call [phone], Monday-Friday, 8am-4pm. If you experience any cold or flu symptoms such as cough, fever, chills, shortness of breath, etc. between now and your scheduled surgery, please notify us at the above number.     Remember:  Do not eat after midnight the night before your surgery   You may drink clear liquids until 11:30 the morning of your surgery.   Clear liquids allowed are: Water, Non-Citrus Juices (without pulp), Carbonated Beverages, Clear Tea (no milk, honey, etc.), Black Coffee Only (NO MILK, CREAM OR POWDERED CREAMER of any kind), and Gatorade.    Take these medicines the morning of surgery with A SIP OF WATER  busPIRone (BUSPAR)  fenofibrate micronized (ANTARA)  gabapentin (NEURONTIN)  pantoprazole (PROTONIX)  pramipexole (MIRAPEX)  TRINTELLIX   May take these medicines IF NEEDED: albuterol (PROVENTIL) nebulizer  albuterol (PROVENTIL) inhaler - be sure to bring with you day of surgery   rizatriptan (MAXALT)    One week prior to surgery, STOP taking any Aspirin (unless otherwise instructed by your surgeon) Aleve, Naproxen, Ibuprofen, Motrin, Advil, Goody's, BC's, all herbal medications, fish oil, and non-prescription vitamins.    WHAT DO I DO ABOUT MY DIABETES MEDICATION?   Do not take oral diabetes medicines (pills) the morning of surgery. Hold your Jardiance for 3 days prior to surgery. Your last dose of Jardiance should be on or before Friday, March 28.  Hold your [facility] for 7 days prior to surgery.  Your last dose of Mounjaro should be on or before Monday, March 24.   THE NIGHT BEFORE SURGERY, do not take any Humalog sliding scale injection.       The day of surgery, If your  CBG is greater than 220 mg/dL, you may take  of your sliding scale (correction) dose of insulin.   HOW TO MANAGE YOUR DIABETES BEFORE AND AFTER SURGERY  Why is it important to control my blood sugar before and after surgery? Improving blood sugar levels before and after surgery helps healing and can limit problems. A way of improving blood sugar control is eating a healthy diet by:  Eating less sugar and carbohydrates  Increasing activity/exercise  Talking with your doctor about reaching your blood sugar goals High blood sugars (greater than 180 mg/dL) can raise your risk of infections and slow your recovery, so you will need to focus on controlling your diabetes during the weeks before surgery. Make sure that the doctor who takes care of your diabetes knows about your planned surgery including the date and location.  How do I manage my blood sugar before surgery? Check your blood sugar at least 4 times a day, starting 2 days before surgery, to make sure that the level is not too high or low.  Check your blood sugar the morning of your surgery when you wake up and every 2 hours until you get to the Short Stay unit.  If your blood sugar is less than 70 mg/dL, you will need to treat for low blood sugar: Do not take insulin. Treat a low blood sugar (less than 70 mg/dL) with  cup of clear juice (cranberry or apple), 4 glucose tablets, OR glucose gel. Recheck blood sugar in 15 minutes after treatment (to make sure  it is greater than 70 mg/dL). If your blood sugar is not greater than 70 mg/dL on recheck, call [phone] for further instructions. Report your blood sugar to the short stay nurse when you get to Short Stay.  If you are admitted to the hospital after surgery: Your blood sugar will be checked by the staff and you will probably be given insulin after surgery (instead of oral diabetes medicines) to make sure you have good blood sugar levels. The goal for blood sugar control after  surgery is 80-180 mg/dL.                   Do NOT Smoke (Tobacco/Vaping) for 24 hours prior to your procedure.  If you use a CPAP at night, you may bring your mask/headgear for your overnight stay.   You will be asked to remove any contacts, glasses, piercing's, hearing aid's, dentures/partials prior to surgery. Please bring cases for these items if needed.    Patients discharged the day of surgery will not be allowed to drive home, and someone needs to stay with them for 24 hours.  SURGICAL WAITING ROOM VISITATION Patients may have no more than 2 support people in the waiting area - these visitors may rotate.   Pre-op nurse will coordinate an appropriate time for 1 ADULT support person, who may not rotate, to accompany patient in pre-op.  Children under the age of 48 must have an adult with them who is not the patient and must remain in the main waiting area with an adult.  If the patient needs to stay at the hospital during part of their recovery, the visitor guidelines for inpatient rooms apply.  Please refer to the [facility] website for the visitor guidelines for any additional information.   If you received a COVID test during your pre-op visit  it is requested that you wear a mask when out in public, stay away from anyone that may not be feeling well and notify your surgeon if you develop symptoms. If you have been in contact with anyone that has tested positive in the last 10 days please notify you surgeon.      Pre-operative CHG Bathing Instructions   You can play a key role in reducing the risk of infection after surgery. Your skin needs to be as free of germs as possible. You can reduce the number of germs on your skin by washing with CHG (chlorhexidine gluconate) soap before surgery. CHG is an antiseptic soap that kills germs and continues to kill germs even after washing.   DO NOT use if you have an allergy to chlorhexidine/CHG or antibacterial soaps. If your skin becomes  reddened or irritated, stop using the CHG and notify one of our RNs at [phone].              TAKE A SHOWER THE NIGHT BEFORE SURGERY AND THE DAY OF SURGERY    Please keep in mind the following:  DO NOT shave, including legs and underarms, 48 hours prior to surgery.   You may shave your face before/day of surgery.  Place clean sheets on your bed the night before surgery Use a clean washcloth (not used since being washed) for each shower. DO NOT sleep with pet's night before surgery.  CHG Shower Instructions:  Wash your face and private area with normal soap. If you choose to wash your hair, wash first with your normal shampoo.  After you use shampoo/soap, rinse your hair and body thoroughly to  remove shampoo/soap residue.  Turn the water OFF and apply half the bottle of CHG soap to a CLEAN washcloth.  Apply CHG soap ONLY FROM YOUR NECK DOWN TO YOUR TOES (washing for 3-5 minutes)  DO NOT use CHG soap on face, private areas, open wounds, or sores.  Pay special attention to the area where your surgery is being performed.  If you are having back surgery, having someone wash your back for you may be helpful. Wait 2 minutes after CHG soap is applied, then you may rinse off the CHG soap.  Pat dry with a clean towel  Put on clean pajamas    Additional instructions for the day of surgery: DO NOT APPLY any lotions, deodorants, cologne, or perfumes.   Do not wear jewelry or makeup Do not wear nail polish, gel polish, artificial nails, or any other type of covering on natural nails (fingers and toes) Do not bring valuables to the hospital. [facility] is not responsible for valuables/personal belongings. Put on clean/comfortable clothes.  Please brush your teeth.  Ask your nurse before applying any prescription medications to the skin.

## 2023-05-11 ENCOUNTER — Encounter (HOSPITAL_COMMUNITY): Payer: Self-pay

## 2023-05-11 ENCOUNTER — Encounter (HOSPITAL_COMMUNITY)
Admission: RE | Admit: 2023-05-11 | Discharge: 2023-05-11 | Disposition: A | Discharge status: Home / Self Care | Source: Ambulatory Visit | Attending: Orthopaedic Surgery | Admitting: Orthopaedic Surgery

## 2023-05-11 ENCOUNTER — Other Ambulatory Visit: Payer: Self-pay

## 2023-05-11 VITALS — BP 110/67 | HR 83 | Temp 97.8°F | Resp 17 | Ht 72.0 in | Wt 251.0 lb

## 2023-05-11 DIAGNOSIS — Z7985 Long-term (current) use of injectable non-insulin antidiabetic drugs: Secondary | ICD-10-CM | POA: Diagnosis not present

## 2023-05-11 DIAGNOSIS — Z79899 Other long term (current) drug therapy: Secondary | ICD-10-CM | POA: Insufficient documentation

## 2023-05-11 DIAGNOSIS — Z01810 Encounter for preprocedural cardiovascular examination: Secondary | ICD-10-CM | POA: Diagnosis present

## 2023-05-11 DIAGNOSIS — K219 Gastro-esophageal reflux disease without esophagitis: Secondary | ICD-10-CM | POA: Diagnosis not present

## 2023-05-11 DIAGNOSIS — G4733 Obstructive sleep apnea (adult) (pediatric): Secondary | ICD-10-CM | POA: Diagnosis not present

## 2023-05-11 DIAGNOSIS — Z9981 Dependence on supplemental oxygen: Secondary | ICD-10-CM | POA: Diagnosis not present

## 2023-05-11 DIAGNOSIS — Z01818 Encounter for other preprocedural examination: Secondary | ICD-10-CM | POA: Diagnosis not present

## 2023-05-11 DIAGNOSIS — E119 Type 2 diabetes mellitus without complications: Secondary | ICD-10-CM | POA: Insufficient documentation

## 2023-05-11 DIAGNOSIS — Z794 Long term (current) use of insulin: Secondary | ICD-10-CM | POA: Diagnosis not present

## 2023-05-11 DIAGNOSIS — J45909 Unspecified asthma, uncomplicated: Secondary | ICD-10-CM | POA: Diagnosis not present

## 2023-05-11 DIAGNOSIS — E089 Diabetes mellitus due to underlying condition without complications: Secondary | ICD-10-CM

## 2023-05-11 DIAGNOSIS — E785 Hyperlipidemia, unspecified: Secondary | ICD-10-CM | POA: Diagnosis not present

## 2023-05-11 DIAGNOSIS — I10 Essential (primary) hypertension: Secondary | ICD-10-CM | POA: Diagnosis not present

## 2023-05-11 DIAGNOSIS — S83231A Complex tear of medial meniscus, current injury, right knee, initial encounter: Secondary | ICD-10-CM | POA: Diagnosis not present

## 2023-05-11 DIAGNOSIS — Z01812 Encounter for preprocedural laboratory examination: Secondary | ICD-10-CM | POA: Diagnosis present

## 2023-05-11 LAB — HEMOGLOBIN A1C
Hgb A1c MFr Bld: 7.1 % — ABNORMAL HIGH (ref 4.8–5.6)
Mean Plasma Glucose: 157.07 mg/dL

## 2023-05-11 LAB — BASIC METABOLIC PANEL WITH GFR
Anion gap: 10 (ref 5–15)
BUN: 25 mg/dL — ABNORMAL HIGH (ref 6–20)
CO2: 28 mmol/L (ref 22–32)
Calcium: 9.7 mg/dL (ref 8.9–10.3)
Chloride: 102 mmol/L (ref 98–111)
Creatinine, Ser: 1.33 mg/dL — ABNORMAL HIGH (ref 0.61–1.24)
GFR, Estimated: >60 mL/min (ref >60)
Glucose, Bld: 122 mg/dL — ABNORMAL HIGH (ref 70–99)
Potassium: 3.9 mmol/L (ref 3.5–5.1)
Sodium: 140 mmol/L (ref 135–145)

## 2023-05-11 LAB — CBC
HCT: 48.7 % (ref 39.0–52.0)
Hemoglobin: 15.8 g/dL (ref 13.0–17.0)
MCH: 27.9 pg (ref 26.0–34.0)
MCHC: 32.4 g/dL (ref 30.0–36.0)
MCV: 85.9 fL (ref 80.0–100.0)
Platelets: 239 10*3/uL (ref 150–400)
RBC: 5.67 MIL/uL (ref 4.22–5.81)
RDW: 14 % (ref 11.5–15.5)
WBC: 8.8 10*3/uL (ref 4.0–10.5)
nRBC: 0 % (ref 0.0–0.2)

## 2023-05-11 LAB — GLUCOSE, CAPILLARY: Glucose-Capillary: 128 mg/dL — ABNORMAL HIGH (ref 70–99)

## 2023-05-11 NOTE — Progress Notes (Signed)
 PCP - Dr. [name] Cardiologist - Denies  PPM/ICD - Denies Device Orders - n/a Rep Notified - n/a  Chest x-ray - N/A EKG - 05/11/23 Stress Test - 07-27-18 ECHO - 10-09-11 Cardiac Cath - Denies  Sleep Study - Yes CPAP - Yes, not sure of settings  Fasting Blood Sugar - 140-150's Checks Blood Sugar _____ times a day. Patient wears a Dexcom on left arm and has an insulin pump. Checks blood sugar multiple times a day.  Last dose of GLP1 agonist-  Mounjaro  GLP1 instructions: Patient stated he took his last dose of Mounjaro on 05-11-23.   Blood Thinner Instructions: Denies Aspirin Instructions: Denies  ERAS Protcol -ERAS until 1130 PRE-SURGERY Ensure or G2- G2   COVID TEST- None   Anesthesia review: Yes, OSA, DM, HTN  Patient denies shortness of breath, fever, cough and chest pain at PAT appointment. Patient has no complaints of respiratory issues at this time.    All instructions explained to the patient, with a verbal understanding of the material. Patient agrees to go over the instructions while at home for a better understanding. Patient also instructed to self quarantine after being tested for COVID-19. The opportunity to ask questions was provided.

## 2023-05-11 NOTE — Progress Notes (Signed)
 Anesthesia Chart Review:  Case: [id number] Date/Time: 05/12/23 1415   Procedure: ARTHROSCOPY, KNEE, WITH MENISCUS REPAIR (Right: Knee) - RIGHT KNEE ARTHROSCOPY WITH MEDIAL MENISCUS REPAIR AND LYSIS OF ADHESIONS   Anesthesia type: General   Diagnosis: Complex tear of medial meniscus of right knee as current injury, subsequent encounter [S83.231D]   Pre-op diagnosis: RIGHT MEDIAL MENISCAL TEAR   [facility] ROOM 02 / [facility] OR   Surgeons: Huel Cote, MD      DISCUSSION: Patient is a 58 year old male scheduled for the above procedure.  Case recently moved from the surgery center to Main OR due to nocturnal O2.  History includes never smoker, HTN, DM2, dyslipidemia, asthma, OSA (uses CPAP, 2L), chronic migraine, GERD. BMI is consistent with obesity.   He uses CPAP. He takes Symbicort and albuterol as needed. His is not currently on prednisone.  A1c 7.1%. He wears a Dexcom CBG and has an insulin pump (Humalog 100 unit/mL). He is also on [place].   He reported last dose of Mounjaro and Jardiance were on 05/11/23. (PAT was not until 05/11/23.) Case is posted for general anesthesia. Anesthesia team to determined definitive anesthesia plan on the day of surgery.    VS: BP 110/67   Pulse 83   Temp 36.6 C   Resp 17   Ht 6' (1.829 m)   Wt 113.9 kg   SpO2 98%   BMI 34.04 kg/m    PROVIDERS: [name], MD is PCP  Ward, Earna Coder, MD is neurologist  LABS: Preoperative labs noted.  (all labs ordered are listed, but only abnormal results are displayed)  Labs Reviewed  GLUCOSE, CAPILLARY - Abnormal; Notable for the following components:      Result Value   Glucose-Capillary 128 (*)    All other components within normal limits  BASIC METABOLIC PANEL WITH GFR - Abnormal; Notable for the following components:   Glucose, Bld 122 (*)    BUN 25 (*)    Creatinine, Ser 1.33 (*)    All other components within normal limits  HEMOGLOBIN A1C - Abnormal; Notable for the following  components:   Hgb A1c MFr Bld 7.1 (*)    All other components within normal limits  CBC    Spirometry 09/21/19: INTERPRETATION: "Normal ventilatory function."   IMAGES: MRI Right Knee 10/19/22: IMPRESSION: 1. Intrasubstance degeneration within junction of the body and posterior horn of the medial meniscus and within the body of the lateral meniscus. No tear is seen extending through an articular surface of either meniscus. 2. Mild-to-moderate mucoid degeneration of the ACL (ACL cyst). No ACL tear is seen. 3. Mild tricompartmental cartilage degenerative changes. 4. Moderate joint effusion.    EKG: 05/11/23: Normal sinus rhythm with sinus arrhythmia   CV: Echo 06/04/18 [facility]):  Conclusions: 1.  The left ventricular size is normal.  There is mild concentric LVH.  There is normal global left ventricular contractility.  Overall LV systolic function is normal with EF between 60 and 65%. 2.  The diastolic filling pattern indicates impaired relaxation. 3.  The right ventricle is mildly enlarged. 4.  The left atrium is mildly dilated. 5.  There is no significant valvular lesions.   Past Medical History:  Diagnosis Date   Allergic rhinitis    Allergy    Anxiety    Asthma    Depression    Diabetes mellitus (HCC)    Dyslipidemia    Fatigue    GERD (gastroesophageal reflux disease)    Headache  Hemorrhoids    Hyperlipidemia    Hypertension    Hypoxia    IBS (irritable bowel syndrome)    Low testosterone    Prostatitis    Sleep apnea    uses CPAP   Sleep apnea with hypersomnolence    Vitamin D deficiency     Past Surgical History:  Procedure Laterality Date   KNEE ARTHROSCOPY Right    WISDOM TOOTH EXTRACTION      MEDICATIONS:  JARDIANCE 25 MG TABS tablet   MOUNJARO 10 MG/0.5ML Pen   albuterol (PROVENTIL) (2.5 MG/3ML) 0.083% nebulizer solution   albuterol (VENTOLIN HFA) 108 (90 Base) MCG/ACT inhaler   ALPRAZolam (XANAX) 0.5 MG tablet   azelastine  (ASTELIN) 0.1 % nasal spray   busPIRone (BUSPAR) 10 MG tablet   celecoxib (CELEBREX) 200 MG capsule   doxepin (SINEQUAN) 10 MG capsule   Erenumab-aooe (AIMOVIG) 70 MG/ML SOAJ   famotidine (PEPCID) 40 MG tablet   fenofibrate micronized (ANTARA) 43 MG capsule   gabapentin (NEURONTIN) 300 MG capsule   HUMALOG 100 UNIT/ML injection   HYDROcodone-acetaminophen (NORCO) 7.5-325 MG tablet   L-Methylfolate 15 MG TABS   lisinopril-hydrochlorothiazide (ZESTORETIC) 20-12.5 MG tablet   pantoprazole (PROTONIX) 40 MG tablet   pramipexole (MIRAPEX) 1.5 MG tablet   predniSONE (DELTASONE) 20 MG tablet   rizatriptan (MAXALT) 10 MG tablet   simvastatin (ZOCOR) 40 MG tablet   SUMAtriptan (IMITREX) 100 MG tablet   SYMBICORT 160-4.5 MCG/ACT inhaler   TRINTELLIX 20 MG TABS tablet   VITAMIN D PO   zolpidem (AMBIEN) 10 MG tablet   No current facility-administered medications for this encounter.    Shonna Chock, PA-C Surgical Short Stay/Anesthesiology [facility] Phone [phone] [facility] Phone [phone] 05/11/2023 4:57 PM

## 2023-05-11 NOTE — Anesthesia Preprocedure Evaluation (Signed)
 Anesthesia Evaluation  Patient identified by MRN, date of birth, ID band Patient awake    Reviewed: Allergy & Precautions, NPO status , Patient's Chart, lab work & pertinent test results  History of Anesthesia Complications Negative for: history of anesthetic complications  Airway Mallampati: III  TM Distance: >3 FB Neck ROM: Full    Dental no notable dental hx.    Pulmonary asthma , sleep apnea and Continuous Positive Airway Pressure Ventilation    Pulmonary exam normal        Cardiovascular hypertension, Pt. on medications Normal cardiovascular exam     Neuro/Psych  Headaches  Anxiety Depression       GI/Hepatic Neg liver ROS,GERD  Medicated,,  Endo/Other  diabetes (Took Mounjaro yesterday), Type 2, Insulin Dependent    Renal/GU negative Renal ROS  negative genitourinary   Musculoskeletal Complex tear of medial meniscus of right knee   Abdominal   Peds  Hematology negative hematology ROS (+)   Anesthesia Other Findings Day of surgery medications reviewed with patient.  Reproductive/Obstetrics negative OB ROS                              Anesthesia Physical Anesthesia Plan  ASA: 2  Anesthesia Plan: General   Post-op Pain Management: Tylenol PO (pre-op)*   Induction: Intravenous and Rapid sequence  PONV Risk Score and Plan: 2 and Treatment may vary due to age or medical condition, Ondansetron, Dexamethasone and Midazolam  Airway Management Planned: Oral ETT  Additional Equipment: None  Intra-op Plan:   Post-operative Plan: Extubation in OR  Informed Consent: I have reviewed the patients History and Physical, chart, labs and discussed the procedure including the risks, benefits and alternatives for the proposed anesthesia with the patient or authorized representative who has indicated his/her understanding and acceptance.     Dental advisory given  Plan Discussed with:  CRNA  Anesthesia Plan Comments: (PAT note written 05/11/2023 by Shonna Chock, PA-C.  )        Anesthesia Quick Evaluation

## 2023-05-12 ENCOUNTER — Encounter (HOSPITAL_COMMUNITY)
Admission: RE | Disposition: A | Discharge status: Home / Self Care | Payer: Self-pay | Source: Home / Self Care | Attending: Orthopaedic Surgery

## 2023-05-12 ENCOUNTER — Ambulatory Visit (HOSPITAL_COMMUNITY): Admitting: Vascular Surgery

## 2023-05-12 ENCOUNTER — Ambulatory Visit (HOSPITAL_COMMUNITY)
Admission: RE | Admit: 2023-05-12 | Discharge: 2023-05-12 | Disposition: A | Discharge status: Home / Self Care | Attending: Orthopaedic Surgery | Admitting: Orthopaedic Surgery

## 2023-05-12 ENCOUNTER — Ambulatory Visit (HOSPITAL_BASED_OUTPATIENT_CLINIC_OR_DEPARTMENT_OTHER): Admitting: Anesthesiology

## 2023-05-12 ENCOUNTER — Other Ambulatory Visit: Payer: Self-pay

## 2023-05-12 ENCOUNTER — Encounter (HOSPITAL_COMMUNITY): Payer: Self-pay | Admitting: Orthopaedic Surgery

## 2023-05-12 DIAGNOSIS — F32A Depression, unspecified: Secondary | ICD-10-CM | POA: Diagnosis not present

## 2023-05-12 DIAGNOSIS — J45909 Unspecified asthma, uncomplicated: Secondary | ICD-10-CM | POA: Insufficient documentation

## 2023-05-12 DIAGNOSIS — M24661 Ankylosis, right knee: Secondary | ICD-10-CM

## 2023-05-12 DIAGNOSIS — F419 Anxiety disorder, unspecified: Secondary | ICD-10-CM | POA: Diagnosis not present

## 2023-05-12 DIAGNOSIS — Z7985 Long-term (current) use of injectable non-insulin antidiabetic drugs: Secondary | ICD-10-CM | POA: Diagnosis not present

## 2023-05-12 DIAGNOSIS — G4730 Sleep apnea, unspecified: Secondary | ICD-10-CM | POA: Insufficient documentation

## 2023-05-12 DIAGNOSIS — S83241D Other tear of medial meniscus, current injury, right knee, subsequent encounter: Secondary | ICD-10-CM

## 2023-05-12 DIAGNOSIS — I10 Essential (primary) hypertension: Secondary | ICD-10-CM | POA: Insufficient documentation

## 2023-05-12 DIAGNOSIS — Z794 Long term (current) use of insulin: Secondary | ICD-10-CM | POA: Diagnosis not present

## 2023-05-12 DIAGNOSIS — E119 Type 2 diabetes mellitus without complications: Secondary | ICD-10-CM | POA: Insufficient documentation

## 2023-05-12 DIAGNOSIS — Z79899 Other long term (current) drug therapy: Secondary | ICD-10-CM | POA: Insufficient documentation

## 2023-05-12 DIAGNOSIS — E089 Diabetes mellitus due to underlying condition without complications: Secondary | ICD-10-CM

## 2023-05-12 DIAGNOSIS — K219 Gastro-esophageal reflux disease without esophagitis: Secondary | ICD-10-CM | POA: Insufficient documentation

## 2023-05-12 DIAGNOSIS — G8929 Other chronic pain: Secondary | ICD-10-CM

## 2023-05-12 HISTORY — PX: KNEE ARTHROSCOPY WITH MENISCAL REPAIR: SHX5653

## 2023-05-12 LAB — GLUCOSE, CAPILLARY
Glucose-Capillary: 145 mg/dL — ABNORMAL HIGH (ref 70–99)
Glucose-Capillary: 136 mg/dL — ABNORMAL HIGH (ref 70–99)

## 2023-05-12 SURGERY — ARTHROSCOPY, KNEE, WITH MENISCUS REPAIR
Anesthesia: General | Site: Knee | Laterality: Right

## 2023-05-12 MED ORDER — FENTANYL CITRATE (PF) 250 MCG/5ML IJ SOLN
INTRAMUSCULAR | Status: AC
  Filled 2023-05-12: qty 5

## 2023-05-12 MED ORDER — MIDAZOLAM HCL 2 MG/2ML IJ SOLN
INTRAMUSCULAR | Status: DC | PRN
  Administered 2023-05-12: 1 mg via INTRAVENOUS

## 2023-05-12 MED ORDER — KETOROLAC TROMETHAMINE 30 MG/ML IJ SOLN
INTRAMUSCULAR | Status: AC
  Filled 2023-05-12: qty 1

## 2023-05-12 MED ORDER — OXYCODONE HCL 5 MG/5ML PO SOLN: 5.0000 mg | Freq: Once | ORAL | Status: DC | PRN

## 2023-05-12 MED ORDER — ACETAMINOPHEN 500 MG PO TABS
1000.0000 mg | ORAL_TABLET | Freq: Once | ORAL | Status: AC
Start: 2023-05-12 — End: 2023-05-12
  Administered 2023-05-12: 1000 mg via ORAL
  Filled 2023-05-12: qty 2

## 2023-05-12 MED ORDER — GABAPENTIN 300 MG PO CAPS
300.0000 mg | ORAL_CAPSULE | Freq: Once | ORAL | Status: DC
  Filled 2023-05-12: qty 1

## 2023-05-12 MED ORDER — PHENYLEPHRINE 80 MCG/ML (10ML) SYRINGE FOR IV PUSH (FOR BLOOD PRESSURE SUPPORT)
PREFILLED_SYRINGE | INTRAVENOUS | Status: DC | PRN
  Administered 2023-05-12 (×2): 40 ug via INTRAVENOUS

## 2023-05-12 MED ORDER — OXYCODONE HCL 5 MG PO TABS: 5.0000 mg | ORAL_TABLET | Freq: Once | ORAL | Status: DC | PRN

## 2023-05-12 MED ORDER — SUGAMMADEX SODIUM 200 MG/2ML IV SOLN
INTRAVENOUS | Status: DC | PRN
  Administered 2023-05-12: 227.8 mg via INTRAVENOUS

## 2023-05-12 MED ORDER — ASPIRIN 325 MG PO TBEC: 325.0000 mg | DELAYED_RELEASE_TABLET | Freq: Every day | ORAL | 0 refills | Status: AC

## 2023-05-12 MED ORDER — ONDANSETRON HCL 4 MG/2ML IJ SOLN
INTRAMUSCULAR | Status: AC
  Filled 2023-05-12: qty 2

## 2023-05-12 MED ORDER — OXYCODONE HCL 5 MG PO TABS: 5.0000 mg | ORAL_TABLET | ORAL | 0 refills | Status: AC | PRN

## 2023-05-12 MED ORDER — ROCURONIUM BROMIDE 10 MG/ML (PF) SYRINGE
PREFILLED_SYRINGE | INTRAVENOUS | Status: AC
  Filled 2023-05-12: qty 10

## 2023-05-12 MED ORDER — CHLORHEXIDINE GLUCONATE 0.12 % MT SOLN
15.0000 mL | Freq: Once | OROMUCOSAL | Status: AC
  Administered 2023-05-12: 15 mL via OROMUCOSAL
  Filled 2023-05-12: qty 15

## 2023-05-12 MED ORDER — SUCCINYLCHOLINE CHLORIDE 200 MG/10ML IV SOSY
PREFILLED_SYRINGE | INTRAVENOUS | Status: AC
  Filled 2023-05-12: qty 10

## 2023-05-12 MED ORDER — LACTATED RINGERS IV SOLN: INTRAVENOUS | Status: DC

## 2023-05-12 MED ORDER — PROPOFOL 10 MG/ML IV BOLUS
INTRAVENOUS | Status: AC
  Filled 2023-05-12: qty 20

## 2023-05-12 MED ORDER — IBUPROFEN 800 MG PO TABS: 800.0000 mg | ORAL_TABLET | Freq: Three times a day (TID) | ORAL | 0 refills | Status: AC

## 2023-05-12 MED ORDER — ROCURONIUM BROMIDE 10 MG/ML (PF) SYRINGE
PREFILLED_SYRINGE | INTRAVENOUS | Status: DC | PRN
  Administered 2023-05-12: 10 mg via INTRAVENOUS
  Administered 2023-05-12: 30 mg via INTRAVENOUS
  Administered 2023-05-12 (×2): 10 mg via INTRAVENOUS

## 2023-05-12 MED ORDER — LIDOCAINE 2% (20 MG/ML) 5 ML SYRINGE
INTRAMUSCULAR | Status: AC
  Filled 2023-05-12: qty 5

## 2023-05-12 MED ORDER — FENTANYL CITRATE (PF) 100 MCG/2ML IJ SOLN
25.0000 ug | INTRAMUSCULAR | Status: DC | PRN
Start: 2023-05-12 — End: 2023-05-12

## 2023-05-12 MED ORDER — ONDANSETRON HCL 4 MG/2ML IJ SOLN
INTRAMUSCULAR | Status: DC | PRN
  Administered 2023-05-12: 4 mg via INTRAVENOUS

## 2023-05-12 MED ORDER — PROPOFOL 10 MG/ML IV BOLUS
INTRAVENOUS | Status: DC | PRN
Start: 2023-05-12 — End: 2023-05-12
  Administered 2023-05-12: 200 mg via INTRAVENOUS

## 2023-05-12 MED ORDER — 0.9 % SODIUM CHLORIDE (POUR BTL) OPTIME
TOPICAL | Status: DC | PRN
  Administered 2023-05-12: 1000 mL

## 2023-05-12 MED ORDER — KETOROLAC TROMETHAMINE 30 MG/ML IJ SOLN
INTRAMUSCULAR | Status: DC | PRN
Start: 2023-05-12 — End: 2023-05-12
  Administered 2023-05-12: 30 mg via INTRAVENOUS

## 2023-05-12 MED ORDER — MIDAZOLAM HCL 2 MG/2ML IJ SOLN
INTRAMUSCULAR | Status: AC
  Filled 2023-05-12: qty 2

## 2023-05-12 MED ORDER — INSULIN ASPART 100 UNIT/ML IJ SOLN
0.0000 [IU] | INTRAMUSCULAR | Status: DC | PRN
  Administered 2023-05-12: 2 [IU] via SUBCUTANEOUS
  Filled 2023-05-12: qty 1

## 2023-05-12 MED ORDER — SODIUM CHLORIDE 0.9 % IR SOLN
Status: DC | PRN
  Administered 2023-05-12 (×3): 3000 mL

## 2023-05-12 MED ORDER — CEFAZOLIN SODIUM-DEXTROSE 2-4 GM/100ML-% IV SOLN
2.0000 g | INTRAVENOUS | Status: AC
  Administered 2023-05-12: 2 g via INTRAVENOUS
  Filled 2023-05-12: qty 100

## 2023-05-12 MED ORDER — LIDOCAINE 2% (20 MG/ML) 5 ML SYRINGE
INTRAMUSCULAR | Status: DC | PRN
  Administered 2023-05-12: 100 mg via INTRAVENOUS

## 2023-05-12 MED ORDER — ORAL CARE MOUTH RINSE: 15.0000 mL | Freq: Once | OROMUCOSAL | Status: AC

## 2023-05-12 MED ORDER — SUCCINYLCHOLINE CHLORIDE 200 MG/10ML IV SOSY
PREFILLED_SYRINGE | INTRAVENOUS | Status: DC | PRN
  Administered 2023-05-12: 120 mg via INTRAVENOUS

## 2023-05-12 MED ORDER — DROPERIDOL 2.5 MG/ML IJ SOLN: 0.6250 mg | Freq: Once | INTRAMUSCULAR | Status: DC | PRN

## 2023-05-12 MED ORDER — ACETAMINOPHEN 500 MG PO TABS: 500.0000 mg | ORAL_TABLET | Freq: Three times a day (TID) | ORAL | 0 refills | Status: AC

## 2023-05-12 MED ORDER — TRANEXAMIC ACID-NACL 1000-0.7 MG/100ML-% IV SOLN
1000.0000 mg | INTRAVENOUS | Status: AC
Start: 2023-05-12 — End: 2023-05-12
  Administered 2023-05-12: 1000 mg via INTRAVENOUS
  Filled 2023-05-12: qty 100

## 2023-05-12 MED ORDER — FENTANYL CITRATE (PF) 250 MCG/5ML IJ SOLN
INTRAMUSCULAR | Status: DC | PRN
  Administered 2023-05-12: 100 ug via INTRAVENOUS
  Administered 2023-05-12: 25 ug via INTRAVENOUS

## 2023-05-12 SURGICAL SUPPLY — 62 items
ALCOHOL 70% 16 OZ (MISCELLANEOUS) ×1 IMPLANT
BAG COUNTER SPONGE SURGICOUNT (BAG) ×1 IMPLANT
BANDAGE ESMARK 6X9 LF (GAUZE/BANDAGES/DRESSINGS) IMPLANT
BLADE CLIPPER SURG (BLADE) IMPLANT
BLADE EXCALIBUR 4.0X13 (MISCELLANEOUS) ×1 IMPLANT
BLADE SHAVER TORPEDO 4X13 (MISCELLANEOUS) ×1 IMPLANT
BLADE SURG 10 STRL SS (BLADE) ×1 IMPLANT
BLADE SURG 15 STRL LF DISP TIS (BLADE) ×1 IMPLANT
BNDG ELASTIC 6INX 5YD STR LF (GAUZE/BANDAGES/DRESSINGS) IMPLANT
BNDG ESMARK 6X9 LF (GAUZE/BANDAGES/DRESSINGS) IMPLANT
CHLORAPREP W/TINT 26 (MISCELLANEOUS) ×1 IMPLANT
COOLER ICEMAN CLASSIC (MISCELLANEOUS) ×1 IMPLANT
COVER SURGICAL LIGHT HANDLE (MISCELLANEOUS) ×1 IMPLANT
CUFF TOURN SGL QUICK 42 (TOURNIQUET CUFF) IMPLANT
CUFF TRNQT CYL 34X4.125X (TOURNIQUET CUFF) IMPLANT
DRAPE ARTHROSCOPY W/POUCH 114 (DRAPES) ×1 IMPLANT
DRAPE HALF SHEET 40X57 (DRAPES) IMPLANT
DRAPE INCISE IOBAN 66X45 STRL (DRAPES) IMPLANT
DRAPE U-SHAPE 47X51 STRL (DRAPES) ×1 IMPLANT
DRSG TEGADERM 4X4.75 (GAUZE/BANDAGES/DRESSINGS) ×3 IMPLANT
DW OUTFLOW CASSETTE/TUBE SET (MISCELLANEOUS) ×1 IMPLANT
ELECT REM PT RETURN 9FT ADLT (ELECTROSURGICAL) ×1 IMPLANT
ELECTRODE REM PT RTRN 9FT ADLT (ELECTROSURGICAL) ×1 IMPLANT
GAUZE PAD ABD 8X10 STRL (GAUZE/BANDAGES/DRESSINGS) IMPLANT
GAUZE SPONGE 4X4 12PLY STRL (GAUZE/BANDAGES/DRESSINGS) IMPLANT
GAUZE XEROFORM 1X8 LF (GAUZE/BANDAGES/DRESSINGS) IMPLANT
GLOVE BIOGEL PI IND STRL 6.5 (GLOVE) ×1 IMPLANT
GLOVE BIOGEL PI IND STRL 8 (GLOVE) ×1 IMPLANT
GLOVE ECLIPSE 6.0 STRL STRAW (GLOVE) ×1 IMPLANT
GLOVE INDICATOR 8.0 STRL GRN (GLOVE) ×1 IMPLANT
GOWN STRL REUS W/ TWL LRG LVL3 (GOWN DISPOSABLE) ×2 IMPLANT
GOWN STRL REUS W/ TWL XL LVL3 (GOWN DISPOSABLE) ×1 IMPLANT
KIT BASIN OR (CUSTOM PROCEDURE TRAY) ×1 IMPLANT
KIT TURNOVER KIT B (KITS) ×1 IMPLANT
MANIFOLD NEPTUNE II (INSTRUMENTS) IMPLANT
NDL 18GX1X1/2 (RX/OR ONLY) (NEEDLE) IMPLANT
NDL HYPO 25GX1X1/2 BEV (NEEDLE) ×1 IMPLANT
NDL SUT 2-0 SCORPION KNEE (NEEDLE) IMPLANT
NEEDLE 18GX1X1/2 (RX/OR ONLY) (NEEDLE) IMPLANT
NEEDLE HYPO 25GX1X1/2 BEV (NEEDLE) ×1 IMPLANT
NEEDLE SUT 2-0 SCORPION KNEE (NEEDLE) IMPLANT
NS IRRIG 1000ML POUR BTL (IV SOLUTION) ×1 IMPLANT
PACK ARTHROSCOPY DSU (CUSTOM PROCEDURE TRAY) ×1 IMPLANT
PAD ARMBOARD POSITIONER FOAM (MISCELLANEOUS) ×2 IMPLANT
PADDING CAST COTTON 6X4 STRL (CAST SUPPLIES) ×1 IMPLANT
PENCIL BUTTON HOLSTER BLD 10FT (ELECTRODE) ×1 IMPLANT
PORT APPOLLO RF 90DEGREE MULTI (SURGICAL WAND) IMPLANT
SOL PREP POV-IOD 4OZ 10% (MISCELLANEOUS) ×1 IMPLANT
SPONGE T-LAP 4X18 ~~LOC~~+RFID (SPONGE) ×1 IMPLANT
SUT ETHILON 3 0 PS 1 (SUTURE) IMPLANT
SUT FIBERWIRE 2-0 18 17.9 3/8 (SUTURE) IMPLANT
SUT MENISCAL KIT (KITS) IMPLANT
SUTURE FIBERWR 2-0 18 17.9 3/8 (SUTURE) IMPLANT
SYR 20ML ECCENTRIC (SYRINGE) ×1 IMPLANT
SYR CONTROL 10ML LL (SYRINGE) IMPLANT
SYR TB 1ML LUER SLIP (SYRINGE) ×1 IMPLANT
TOWEL GREEN STERILE (TOWEL DISPOSABLE) ×1 IMPLANT
TOWEL GREEN STERILE FF (TOWEL DISPOSABLE) ×1 IMPLANT
TUBE CONNECTING 12X1/4 (SUCTIONS) ×1 IMPLANT
TUBING ARTHROSCOPY IRRIG 16FT (MISCELLANEOUS) ×1 IMPLANT
WAND ABLATOR APOLLO I90 (BUR) IMPLANT
WATER STERILE IRR 1000ML POUR (IV SOLUTION) ×1 IMPLANT

## 2023-05-12 NOTE — Anesthesia Procedure Notes (Signed)
 Procedure Name: Intubation Date/Time: 05/12/2023 4:06 PM  Performed by: Cy Blamer, CRNAPre-anesthesia Checklist: Patient identified, Emergency Drugs available, Suction available and Patient being monitored Patient Re-evaluated:Patient Re-evaluated prior to induction Oxygen Delivery Method: Circle system utilized Preoxygenation: Pre-oxygenation with 100% oxygen Induction Type: IV induction and Rapid sequence Laryngoscope Size: Miller and 2 Grade View: Grade II Tube type: Oral Tube size: 7.0 mm Number of attempts: 1 Airway Equipment and Method: Stylet and Bite block Placement Confirmation: ETT inserted through vocal cords under direct vision, positive ETCO2 and breath sounds checked- equal and bilateral Secured at: 22 cm Tube secured with: Tape Dental Injury: Teeth and Oropharynx as per pre-operative assessment

## 2023-05-12 NOTE — Interval H&P Note (Signed)
 History and Physical Interval Note:  05/12/2023 12:19 PM  Donald Payne  has presented today for surgery, with the diagnosis of RIGHT MEDIAL MENISCAL TEAR.  The various methods of treatment have been discussed with the patient and family. After consideration of risks, benefits and other options for treatment, the patient has consented to  Procedure(s) with comments: ARTHROSCOPY, KNEE, WITH MENISCUS REPAIR (Right) - RIGHT KNEE ARTHROSCOPY WITH MEDIAL MENISCUS REPAIR AND LYSIS OF ADHESIONS as a surgical intervention.  The patient's history has been reviewed, patient examined, no change in status, stable for surgery.  I have reviewed the patient's chart and labs.  Questions were answered to the patient's satisfaction.     Huel Cote

## 2023-05-12 NOTE — Brief Op Note (Signed)
   Brief Op Note  Date of Surgery: 05/12/2023  Preoperative Diagnosis: RIGHT MEDIAL MENISCAL TEAR  Postoperative Diagnosis: same  Procedure: Procedure(s): ARTHROSCOPY, KNEE, WITH MENISCUS REPAIR  Implants: * No implants in log *  Surgeons: Surgeon(s): Huel Cote, MD  Anesthesia: General    Estimated Blood Loss: See anesthesia record  Complications: None  Condition to PACU: Stable  Benancio Deeds, MD 05/12/2023 4:56 PM

## 2023-05-12 NOTE — Transfer of Care (Signed)
 Immediate Anesthesia Transfer of Care Note  Patient: Donald Payne  Procedure(s) Performed: ARTHROSCOPY, KNEE, WITH MENISCUS REPAIR (Right: Knee)  Patient Location: PACU  Anesthesia Type:General  Level of Consciousness: awake, alert , and oriented  Airway & Oxygen Therapy: Patient Spontanous Breathing  Post-op Assessment: Report given to RN, Post -op Vital signs reviewed and stable, Patient moving all extremities X 4, and Patient able to stick tongue midline  Post vital signs: Reviewed and stable  Last Vitals:  Vitals Value Taken Time  BP 122/69 05/12/23 1707  Temp 98.6   Pulse 88 05/12/23 1709  Resp 13 05/12/23 1709  SpO2 94 % 05/12/23 1709  Vitals shown include unfiled device data.  Last Pain:  Vitals:   05/12/23 1305  TempSrc:   PainSc: 0-No pain         Complications: No notable events documented.

## 2023-05-12 NOTE — Progress Notes (Signed)
 Dr. Stephannie Peters made aware of patient taking Mounjaro on 05/11/23.

## 2023-05-12 NOTE — Discharge Instructions (Signed)
 Discharge Instructions    Attending Surgeon: Huel Cote, MD Office Phone Number: [phone]   Diagnosis and Procedures:    Surgeries Performed: Right knee arthroscopic lysis of adhesions  Discharge Plan:    Diet: Resume usual diet. Begin with light or bland foods.  Drink plenty of fluids.  Activity:  Activity and weightbearing as tolerated. You are advised to go home directly from the hospital or surgical center. Restrict your activities.  GENERAL INSTRUCTIONS: 1.  Please apply ice to your wound to help with swelling and inflammation. This will improve your comfort and your overall recovery following surgery.     2. Please call Dr. Serena Croissant office at [phone] with questions Monday-Friday during business hours. If no one answers, please leave a message and someone should get back to the patient within 24 hours. For emergencies please call 911 or proceed to the emergency room.   3. Patient to notify surgical team if experiences any of the following: Bowel/Bladder dysfunction, uncontrolled pain, nerve/muscle weakness, incision with increased drainage or redness, nausea/vomiting and Fever greater than 101.0 F.  Be alert for signs of infection including redness, streaking, odor, fever or chills. Be alert for excessive pain or bleeding and notify your surgeon immediately.  WOUND INSTRUCTIONS:   Leave your dressing, cast, or splint in place until your post operative visit.  Keep it clean and dry.  Always keep the incision clean and dry until the staples/sutures are removed. If there is no drainage from the incision you should keep it open to air. If there is drainage from the incision you must keep it covered at all times until the drainage stops  Do not soak in a bath tub, hot tub, pool, lake or other body of water until 21 days after your surgery and your incision is completely dry and healed.  If you have removable sutures (or staples) they must be removed 10-14 days  (unless otherwise instructed) from the day of your surgery.     1)  Elevate the extremity as much as possible.  2)  Keep the dressing clean and dry.  3)  Please call us if the dressing becomes wet or dirty.  4)  If you are experiencing worsening pain or worsening swelling, please call.     MEDICATIONS: Resume all previous home medications at the previous prescribed dose and frequency unless otherwise noted Start taking the  pain medications on an as-needed basis as prescribed  Please taper down pain medication over the next week following surgery.  Ideally you should not require a refill of any narcotic pain medication.  Take pain medication with food to minimize nausea. In addition to the prescribed pain medication, you may take over-the-counter pain relievers such as Tylenol.  Do NOT take additional tylenol if your pain medication already has tylenol in it.  Aspirin 325mg  daily per instructions on bottle. Narcotic policy: Per [facility] clinic policy, our goal is ensure optimal postoperative pain control with a multimodal pain management strategy. For all [facility] patients, our goal is to wean post-operative narcotic medications by 6 weeks post-operatively, and many times sooner. If this is not possible due to utilization of pain medication prior to surgery, your [facility] doctor will support your acute post-operative pain control for the first 6 weeks postoperatively, with a plan to transition you back to your primary pain team following that. Cyndia Skeeters will work to ensure a Therapist, occupational.       FOLLOWUP INSTRUCTIONS: 1. Follow up at the Physical  Therapy Clinic 3-4 days following surgery. This appointment should be scheduled unless other arrangements have been made.The Physical Therapy scheduling number is [phone] if an appointment has not already been arranged.  2. Contact Dr. Serena Croissant office during office hours at [phone] or the practice after hours line at [phone] for  non-emergencies. For medical emergencies call 911.   Discharge Location: Home

## 2023-05-12 NOTE — Anesthesia Postprocedure Evaluation (Signed)
 Anesthesia Post Note  Patient: Donald Payne  Procedure(s) Performed: ARTHROSCOPY, KNEE, WITH MENISCUS REPAIR (Right: Knee)     Patient location during evaluation: PACU Anesthesia Type: General Level of consciousness: awake and alert Pain management: pain level controlled Vital Signs Assessment: post-procedure vital signs reviewed and stable Respiratory status: spontaneous breathing, nonlabored ventilation and respiratory function stable Cardiovascular status: blood pressure returned to baseline Postop Assessment: no apparent nausea or vomiting Anesthetic complications: no   No notable events documented.  Last Vitals:  Vitals:   05/12/23 1730 05/12/23 1745  BP: 122/60 (!) 112/55  Pulse: 84 76  Resp: 13 12  Temp:  36.6 C  SpO2: 94% 94%    Last Pain:  Vitals:   05/12/23 1730  TempSrc:   PainSc: 2                  Shanda Howells

## 2023-05-12 NOTE — Op Note (Signed)
 Date of Surgery: 05/12/2023  INDICATIONS: [name] is a 58 y.o.-year-old male with right knee arthrofribrosis.  The risk and benefits of the procedure were discussed in detail and documented in the pre-operative evaluation.   PREOPERATIVE DIAGNOSIS: 1. Right knee arthrofibrosis  POSTOPERATIVE DIAGNOSIS: Same.  PROCEDURE: 1. Right knee arthroscopy with lysis of adhesions  SURGEON: Benancio Deeds MD  ASSISTANT: Ardeen Fillers, ATC  ANESTHESIA:  general  IV FLUIDS AND URINE: See anesthesia record.  ANTIBIOTICS: Ancef  ESTIMATED BLOOD LOSS: 10 mL.  IMPLANTS:  * No implants in log *  DRAINS: None  CULTURES: None  COMPLICATIONS: none  DESCRIPTION OF PROCEDURE: Examination under anesthesia: A careful examination under anesthesia was performed.  Knee ROM motion was: 2-105 Lachman: Normal Pivot Shift: Normal Posterior drawer: normal.   Varus stability in full extension: normal.   Varus stability in 30 degrees of flexion: normal.  Valgus stability in full extension: normal.   Valgus stability in 30 degrees of flexion: normal.  Posterolateral drawer: normal   Intra-operative findings: A thorough arthroscopic examination of the knee was performed.  The findings are: 1. Suprapatellar pouch: Difficult adhesions 2. Undersurface of median ridge: Normal 3. Medial patellar facet: Normal 4. Lateral patellar facet: Normal 5. Trochlea: Normal 6. Lateral gutter/popliteus tendon: Significant adhesions 7. Hoffa's fat pad: Normal 8. Medial gutter/plica: Significant adhesions 9. ACL: Normal 10. PCL: Normal 11. Medial meniscus: Normal 12. Medial compartment cartilage: Normal 13. Lateral meniscus: Normal 14. Lateral compartment cartilage: Normal  I identified the patient in the pre-operative holding area.  I marked the operative knee with my initials. I reviewed the risks and benefits of the proposed surgical intervention and the patient wished to proceed.  Anesthesia performed a  peripheral nerve block.  Patient was subsequently taken back to the operating room.  The patient was transferred to the operative suite and placed in the supine position with all bony prominences padded.     SCDs were placed on the non-operative lower extremity. Appropriate antibiotics was administered within 1 hour before incision. The operative lower extremity was then prepped and draped in standard fashion. A time out was performed confirming the correct extremity, correct patient and correct procedure.    A standard anterolateral portal was made with an 11 blade.  The ideal position for the anteromedial portal was established using a spinal needle.  This AM portal was then created under direct visualization with an 11 blade.  A full diagnostic arthroscopy was then performed, as described above, including probing of the chondral and meniscal surfaces.  An accessory superior medial portal was also utilized for visualization.  At this time the electrocautery wand was introduced into the superior medial accessory portal and the superior patellar pouch adhesions were debrided with electrocautery.  This was done with the medial and lateral collateral to perform a synovectomy.  At this time the knee was manipulated and had gone to full extension to approximately 130 degrees of flexion.    That concluded the case.  Skin was closed with 3-0 nylon. Xeroform gauze, gauze, Tegaderm, Iceman and brace were applied.  Instrument, sponge, and needle counts were correct prior to wound closure and at the conclusion of the case.  The patient was taken to the PACU without complication    POSTOPERATIVE PLAN: Will be weightbearing and activity as tolerated.  He will be seen by physical therapy postop for range of motion.  He will be placed on aspirin for 2 weeks of blood clot prevention  Donald Payne  Donald Bamberger, MD 4:56 PM

## 2023-05-13 ENCOUNTER — Other Ambulatory Visit: Payer: Self-pay | Admitting: Adult Health

## 2023-05-13 ENCOUNTER — Encounter (HOSPITAL_COMMUNITY): Payer: Self-pay | Admitting: Orthopaedic Surgery

## 2023-05-13 DIAGNOSIS — F411 Generalized anxiety disorder: Secondary | ICD-10-CM

## 2023-05-13 DIAGNOSIS — F429 Obsessive-compulsive disorder, unspecified: Secondary | ICD-10-CM

## 2023-05-14 NOTE — Therapy (Signed)
 OUTPATIENT PHYSICAL THERAPY LOWER EXTREMITY EVALUATION   Patient Name: Donald Payne MRN: [id number] DOB:[birth year], 58 y.o., male Today's Date: 05/18/2023  END OF SESSION:  PT End of Session - 05/18/23 1615     Visit Number 1    Date for PT Re-Evaluation 08/10/23    Authorization Type [employer]    PT Start Time 1615    PT Stop Time 1700    PT Time Calculation (min) 45 min    Activity Tolerance Patient tolerated treatment well             Past Medical History:  Diagnosis Date   Allergic rhinitis    Allergy    Anxiety    Asthma    Depression    Diabetes mellitus (HCC)    Dyslipidemia    Fatigue    GERD (gastroesophageal reflux disease)    Headache    Hemorrhoids    Hyperlipidemia    Hypertension    Hypoxia    IBS (irritable bowel syndrome)    Low testosterone    Prostatitis    Sleep apnea    uses CPAP   Sleep apnea with hypersomnolence    Vitamin D deficiency    Past Surgical History:  Procedure Laterality Date   KNEE ARTHROSCOPY Right    KNEE ARTHROSCOPY WITH MENISCAL REPAIR Right 05/12/2023   Procedure: ARTHROSCOPY, KNEE, WITH MENISCUS REPAIR;  Surgeon: Huel Cote, MD;  Location: [facility] OR;  Service: Orthopedics;  Laterality: Right;  RIGHT KNEE ARTHROSCOPY WITH MEDIAL MENISCUS REPAIR AND LYSIS OF ADHESIONS   WISDOM TOOTH EXTRACTION     Patient Active Problem List   Diagnosis Date Noted   Arthrofibrosis of knee joint, right 05/12/2023   Hypertensive heart disease 07/06/2018   Asthma 07/06/2018   Sleep apnea 07/06/2018   Morbid obesity (HCC) 07/06/2018   Dyspnea 10/27/2011    PCP: [name]  REFERRING PROVIDER: Huel Cote  REFERRING DIAG:  [phone] (ICD-10-CM) - Complex tear of medial meniscus of right knee as current injury, subsequent encounter    THERAPY DIAG:  Acute pain of right knee  Stiffness of right knee, not elsewhere classified  S/P lateral meniscus repair of right knee  Rationale for Evaluation and Treatment:  Rehabilitation  ONSET DATE: 05/12/23  SUBJECTIVE:   SUBJECTIVE STATEMENT: I am okay. Knee is not too bad.   PERTINENT HISTORY: R arthroscopy with medial meniscal repair 05/12/23 DM, depression, HTN, anxiety, obesity   PAIN:  Are you having pain? Yes: NPRS scale: 1/10 Pain location: quad is a little sore, medial knee Pain description: a little sharp pain Aggravating factors: quick movements  Relieving factors: I have pain meds but I don't need any of it   PRECAUTIONS: Knee  RED FLAGS: None   WEIGHT BEARING RESTRICTIONS: No  FALLS:  Has patient fallen in last 6 months? 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  DIAGNOSTIC FINDINGS: IMPRESSION: 1. Intrasubstance degeneration within junction of the body and posterior horn of the medial meniscus and within the body of the  lateral meniscus. No tear is seen extending through an articular surface of either meniscus. 2. Mild-to-moderate mucoid degeneration of the ACL (ACL cyst). No ACL tear is seen. 3. Mild tricompartmental cartilage degenerative changes. 4. Moderate joint effusion.   COGNITION: Overall cognitive status: Within functional limits for tasks assessed     SENSATION: WFL  EDEMA:  Swelling around R  MUSCLE LENGTH: Hamstrings: tightness in R hamstring  PALPATION: Pain with flexion PROM  LOWER EXTREMITY ROM:  Active ROM Right Eval Seated  Left eval  Hip flexion    Hip extension    Hip abduction    Hip adduction    Hip internal  rotation    Hip external rotation    Knee flexion 86 w/pain   Knee extension 15    Ankle dorsiflexion    Ankle plantarflexion    Ankle inversion    Ankle eversion     (Blank rows = not tested)  LOWER EXTREMITY MMT:  MMT Right eval Left eval  Hip flexion 5   Hip extension    Hip abduction    Hip adduction    Hip internal rotation    Hip external rotation    Knee flexion 4+   Knee extension 5   Ankle dorsiflexion    Ankle plantarflexion    Ankle inversion    Ankle eversion     (Blank rows = not tested)   FUNCTIONAL TESTS:  5 times sit to stand: 19.54s from elevated mat table  Timed up and go (TUG): 12.78s antalgic   GAIT: Distance walked: in clinic distances  Assistive device utilized: None Level of assistance: Modified independence Comments: antalgic gait, slowed speed, decreased knee flexion                                                                          TREATMENT DATE: 05/18/23- EVAL, HEP    PATIENT EDUCATION:  Education details: POC, HEP, icing and elevating Person educated: Patient Education method: Explanation Education comprehension: verbalized understanding  HOME EXERCISE PROGRAM: Access Code: [email or web address] URL: [email or web address] Date: 05/18/2023 Prepared by: Cassie Freer  Exercises - Supine Quad Set  - 1 x daily - 7 x weekly - 2 sets - 10 reps - Supine Heel Slide with Strap  - 1 x daily - 7 x weekly - 2 sets - 10 reps - Sit to Stand  - 1 x daily - 7 x weekly - 2 sets - 10 reps - Seated Long Arc Quad  - 1 x daily - 7 x weekly - 2 sets - 10 reps  ASSESSMENT:  CLINICAL IMPRESSION: Patient is a 58 y.o. male who was seen today for physical therapy evaluation and treatment for R knee pain s/p mensicus surgery on 4/1. His biggest limitation is knee flexion, he is also missing some TKE. Patient reports low pain levels. He is walking with antalgic gait, no AD and has an ace wrap bandage around his incision. Has a follow up with his  doctor 05/21/23. Patient will benefit from skilled PT to address his R knee deficits to return to PLOF.   OBJECTIVE IMPAIRMENTS: Abnormal gait, decreased mobility, difficulty walking, decreased ROM, decreased strength, and pain.   ACTIVITY LIMITATIONS: squatting, stairs, transfers, and locomotion  level  PARTICIPATION LIMITATIONS: shopping, community activity, occupation, and yard work  PERSONAL FACTORS: Age and Fitness are also affecting patient's functional outcome.   REHAB POTENTIAL: Good  CLINICAL DECISION MAKING: Stable/uncomplicated  EVALUATION COMPLEXITY: Low  GOALS: Goals reviewed with patient? Yes   SHORT TERM GOALS: Target date: 06/29/23  Independent with initial HEP. Baseline: given 05/18/23 Goal status: INITIAL   2. Patient will demonstrate improved functional LE strength by completing 5x STS in <15 seconds. Baseline: 19.54s from elevated mat  Goal status: INITIAL   LONG TERM GOALS: Target date: 08/10/23  Independent with advanced/ongoing HEP to improve outcomes and carryover.  Baseline:  Goal status: INITIAL  2.  YESHUA STRYKER will demonstrate R knee flexion to 120 deg to ascend/descend stairs. Baseline: 86 w/pain Goal status: INITIAL  3.  Napoleon Form Musson will demonstrate full R knee extension for safety with gait. Baseline: 15 Goal status: INITIAL  4.  REAGEN HABERMAN will be able to ambulate at least 600' safely with LRAD and normal gait pattern to access community.  Baseline: antalgic gait Goal status: INITIAL  5.  KRAVEN CALK will be able to put his socks on and off.  Baseline: unable to do Goal status: INITIAL   PLAN:  PT FREQUENCY: 2x/week  PT DURATION: 12 weeks  PLANNED INTERVENTIONS: 97110-Therapeutic exercises, 97530- Therapeutic activity, 97112- Neuromuscular re-education, 97535- Self Care, 16109- Manual therapy, [phone]- Gait training, [phone]- Vasopneumatic device, Patient/Family education, Balance training, Stair training, Taping, Dry  Needling, Joint mobilization, and Cryotherapy  PLAN FOR NEXT SESSION: R knee strengthening, PROM, functional tasks- steps, STS   [employer], PT 05/18/2023, 5:02 PM

## 2023-05-18 ENCOUNTER — Ambulatory Visit: Attending: Orthopaedic Surgery

## 2023-05-18 DIAGNOSIS — M25561 Pain in right knee: Secondary | ICD-10-CM | POA: Insufficient documentation

## 2023-05-18 DIAGNOSIS — S83231D Complex tear of medial meniscus, current injury, right knee, subsequent encounter: Secondary | ICD-10-CM | POA: Insufficient documentation

## 2023-05-18 DIAGNOSIS — Z9889 Other specified postprocedural states: Secondary | ICD-10-CM | POA: Diagnosis present

## 2023-05-18 DIAGNOSIS — M25661 Stiffness of right knee, not elsewhere classified: Secondary | ICD-10-CM | POA: Diagnosis present

## 2023-05-21 ENCOUNTER — Ambulatory Visit (HOSPITAL_BASED_OUTPATIENT_CLINIC_OR_DEPARTMENT_OTHER): Admitting: Orthopaedic Surgery

## 2023-05-21 ENCOUNTER — Ambulatory Visit: Admitting: Physical Therapy

## 2023-05-21 ENCOUNTER — Encounter: Payer: Self-pay | Admitting: Physical Therapy

## 2023-05-21 DIAGNOSIS — Z9889 Other specified postprocedural states: Secondary | ICD-10-CM

## 2023-05-21 DIAGNOSIS — M25661 Stiffness of right knee, not elsewhere classified: Secondary | ICD-10-CM

## 2023-05-21 DIAGNOSIS — S83231D Complex tear of medial meniscus, current injury, right knee, subsequent encounter: Secondary | ICD-10-CM

## 2023-05-21 DIAGNOSIS — M25561 Pain in right knee: Secondary | ICD-10-CM

## 2023-05-21 NOTE — Therapy (Addendum)
 OUTPATIENT PHYSICAL THERAPY LOWER EXTREMITY    Patient Name: Donald Payne MRN: [id number] DOB:[birth year], 58 y.o., male Today's Date: 05/21/2023  END OF SESSION:  PT End of Session - 05/21/23 1604     Date for PT Re-Evaluation 08/10/23    PT Start Time 1605    PT Stop Time 1650    PT Time Calculation (min) 45 min             Past Medical History:  Diagnosis Date   Allergic rhinitis    Allergy    Anxiety    Asthma    Depression    Diabetes mellitus (HCC)    Dyslipidemia    Fatigue    GERD (gastroesophageal reflux disease)    Headache    Hemorrhoids    Hyperlipidemia    Hypertension    Hypoxia    IBS (irritable bowel syndrome)    Low testosterone    Prostatitis    Sleep apnea    uses CPAP   Sleep apnea with hypersomnolence    Vitamin D deficiency    Past Surgical History:  Procedure Laterality Date   KNEE ARTHROSCOPY Right    KNEE ARTHROSCOPY WITH MENISCAL REPAIR Right 05/12/2023   Procedure: ARTHROSCOPY, KNEE, WITH MENISCUS REPAIR;  Surgeon: Huel Cote, MD;  Location: [facility] OR;  Service: Orthopedics;  Laterality: Right;  RIGHT KNEE ARTHROSCOPY WITH MEDIAL MENISCUS REPAIR AND LYSIS OF ADHESIONS   WISDOM TOOTH EXTRACTION     Patient Active Problem List   Diagnosis Date Noted   Arthrofibrosis of knee joint, right 05/12/2023   Hypertensive heart disease 07/06/2018   Asthma 07/06/2018   Sleep apnea 07/06/2018   Morbid obesity (HCC) 07/06/2018   Dyspnea 10/27/2011    PCP: [name]  REFERRING PROVIDER: Huel Cote  REFERRING DIAG:  [phone] (ICD-10-CM) - Complex tear of medial meniscus of right knee as current injury, subsequent encounter    THERAPY DIAG:  No diagnosis found.  Rationale for Evaluation and Treatment: Rehabilitation  ONSET DATE: 05/12/23  SUBJECTIVE: Walked in with impaired gait with L lateral shift and circumduction. Has been doing some of his HEP. He has trouble standing up from a lower chair but isn't otherwise limited in  activities.    SUBJECTIVE STATEMENT: I am okay. Knee is not too bad.   PERTINENT HISTORY: R arthroscopy with medial meniscal repair 05/12/23 DM, depression, HTN, anxiety, obesity   PAIN:  Are you having pain? 0/10 before tx 3/10 after tx PRECAUTIONS: Knee  RED FLAGS: None   WEIGHT BEARING RESTRICTIONS: No  FALLS:  Has patient fallen in last 6 months? 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  DIAGNOSTIC FINDINGS: IMPRESSION: 1. Intrasubstance degeneration within junction of the body and posterior horn of the medial meniscus and within the body of the lateral meniscus. No tear is seen extending through an articular surface of either meniscus. 2. Mild-to-moderate mucoid degeneration of the ACL (ACL cyst). No ACL tear is seen. 3. Mild tricompartmental cartilage degenerative changes. 4. Moderate joint effusion.  COGNITION: Overall cognitive status: Within functional limits for tasks assessed     SENSATION: WFL  EDEMA:  Swelling around R  MUSCLE LENGTH: Hamstrings: tightness in R hamstring  PALPATION: Pain with flexion PROM  LOWER EXTREMITY ROM:  Active ROM Right Eval Seated  Left eval  Hip flexion    Hip extension    Hip abduction    Hip adduction    Hip internal rotation    Hip external rotation    Knee flexion 86 w/pain   Knee extension 15    Ankle dorsiflexion    Ankle plantarflexion    Ankle inversion    Ankle eversion     (Blank rows = not tested)  LOWER EXTREMITY MMT:  MMT  Right eval Left eval  Hip flexion 5   Hip extension    Hip abduction    Hip adduction    Hip internal rotation    Hip external rotation    Knee flexion 4+   Knee extension 5   Ankle dorsiflexion    Ankle plantarflexion    Ankle inversion    Ankle eversion     (Blank rows = not tested)   FUNCTIONAL TESTS:  5 times sit to stand: 19.54s from elevated mat table  Timed up and go (TUG): 12.78s antalgic   GAIT: Distance walked: in clinic distances  Assistive device utilized: None Level of assistance: Modified independence Comments: antalgic gait, slowed speed, decreased knee flexion                                                                          TREATMENT DATE:  05/21/23 Nustep L4 PROM with end range hold  Joint knee mobs ant & post QS x10 SLR x10 Seated knee ext x10 Sit to stand 2x10 Gait emphasize Hip flexion and heel strike 95ft PF stretch 6in step up x6, too much compensation with circumduction, switched to 4in x10 2in step up on airex pad 2x10    05/18/23- EVAL, HEP    PATIENT EDUCATION:  Education details: POC, HEP, icing and elevating Person educated: Patient Education method: Explanation Education comprehension: verbalized understanding  HOME EXERCISE PROGRAM: Access Code: [email or web address] URL: [email or web address] Date: 05/18/2023 Prepared by: Cassie Freer  Exercises - Supine Quad Set  - 1 x daily - 7 x weekly - 2 sets - 10 reps - Supine Heel Slide with Strap  - 1 x daily - 7 x weekly - 2 sets - 10 reps - Sit to Stand  - 1 x daily - 7 x weekly - 2 sets - 10 reps - Seated Long Arc Quad  - 1 x daily - 7 x weekly - 2 sets - 10 reps  ASSESSMENT:  CLINICAL IMPRESSION:  Encouraged HEP to increase R LE strength and ROM.Knee mobs increased knee mobility. Pt had better PROM than he exhibits possibly due to pain. He walked in with circumduction and over compensated during step ups. Required cuing to emphasized hip flexion during gait and  step ups for correction of compensatory techniques.   Patient is a 58 y.o. male who was seen today for physical therapy evaluation and treatment for R knee pain s/p mensicus surgery on 4/1. His biggest limitation is knee flexion, he  is also missing some TKE. Patient reports low pain levels. He is walking with antalgic gait, no AD and has an ace wrap bandage around his incision. Has a follow up with his doctor 05/21/23. Patient will benefit from skilled PT to address his R knee deficits to return to PLOF.   OBJECTIVE IMPAIRMENTS: Abnormal gait, decreased mobility, difficulty walking, decreased ROM, decreased strength, and pain.   ACTIVITY LIMITATIONS: squatting, stairs, transfers, and locomotion level  PARTICIPATION LIMITATIONS: shopping, community activity, occupation, and yard work  PERSONAL FACTORS: Age and Fitness are also affecting patient's functional outcome.   REHAB POTENTIAL: Good  CLINICAL DECISION MAKING: Stable/uncomplicated  EVALUATION COMPLEXITY: Low  GOALS: Goals reviewed with patient? Yes   SHORT TERM GOALS: Target date: 06/29/23  Independent with initial HEP. Baseline: given 05/18/23 Goal status: INITIAL   2. Patient will demonstrate improved functional LE strength by completing 5x STS in <15 seconds. Baseline: 19.54s from elevated mat  Goal status: INITIAL   LONG TERM GOALS: Target date: 08/10/23  Independent with advanced/ongoing HEP to improve outcomes and carryover.  Baseline:  Goal status: INITIAL  2.  TORRIE LAFAVOR will demonstrate R knee flexion to 120 deg to ascend/descend stairs. Baseline: 86 w/pain Goal status: INITIAL  3.  Napoleon Form Esbenshade will demonstrate full R knee extension for safety with gait. Baseline: 15 Goal status: INITIAL  4.  MURAT RIDEOUT will be able to ambulate at least 600' safely with LRAD and normal gait pattern to access community.  Baseline: antalgic gait Goal status: INITIAL  5.  KATELYN KOHLMEYER will be able to put his  socks on and off.  Baseline: unable to do Goal status: INITIAL    PLAN:  PT FREQUENCY: 2x/week  PT DURATION: 12 weeks  PLANNED INTERVENTIONS: 97110-Therapeutic exercises, 97530- Therapeutic activity, 97112- Neuromuscular re-education, 97535- Self Care, 16109- Manual therapy, [phone]- Gait training, [phone]- Vasopneumatic device, Patient/Family education, Balance training, Stair training, Taping, Dry Needling, Joint mobilization, and Cryotherapy  PLAN FOR NEXT SESSION: R knee strengthening, PROM, functional tasks- steps, STS   During this treatment session, the therapist was present, participating in and directing the treatment.  Marylene Land Payseur PTA  Maryclare Labrador 05/21/2023, 4:05 PM [facility] at [facility] [address] [facility] [place], [place], [address] Phone: [phone]   Fax:  [phone]  Patient Details  Name: LLEWYN HEAP MRN: [id number] Date of Birth: 08-26-1965 Referring Provider:  Huel Cote, MD  Encounter Date: 05/21/2023   Maryclare Labrador 05/21/2023, 4:05 PM  [facility] at [facility] [address] [place], [place], [address] Phone: [phone]   Fax:  [phone]

## 2023-05-21 NOTE — Progress Notes (Signed)
 Post Operative Evaluation    Procedure/Date of Surgery: Right knee lysis of adhesions 4/1  Interval History:   Donald Payne today 2-week status post right knee arthroscopic lysis of adhesions.  Overall he is doing extremely well.  Range of motion is improving.  He has begun physical therapy.  He is weightbearing as tolerated   PMH/PSH/Family History/Social History/Meds/Allergies:    Past Medical History:  Diagnosis Date   Allergic rhinitis    Allergy    Anxiety    Asthma    Depression    Diabetes mellitus (HCC)    Dyslipidemia    Fatigue    GERD (gastroesophageal reflux disease)    Headache    Hemorrhoids    Hyperlipidemia    Hypertension    Hypoxia    IBS (irritable bowel syndrome)    Low testosterone    Prostatitis    Sleep apnea    uses CPAP   Sleep apnea with hypersomnolence    Vitamin D deficiency    Past Surgical History:  Procedure Laterality Date   KNEE ARTHROSCOPY Right    KNEE ARTHROSCOPY WITH MENISCAL REPAIR Right 05/12/2023   Procedure: ARTHROSCOPY, KNEE, WITH MENISCUS REPAIR;  Surgeon: Huel Cote, MD;  Location: [facility] OR;  Service: Orthopedics;  Laterality: Right;  RIGHT KNEE ARTHROSCOPY WITH MEDIAL MENISCUS REPAIR AND LYSIS OF ADHESIONS   WISDOM TOOTH EXTRACTION     Social History   Socioeconomic History   Marital status: Married    Spouse name: Donald Payne   Number of children: 0   Years of education: Not on file   Highest education level: Master's degree (e.g., MA, MS, MEng, MEd, MSW, MBA)  Occupational History   Occupation: Field seismologist  Tobacco Use   Smoking status: Never   Smokeless tobacco: Never  Vaping Use   Vaping status: Never Used  Substance and Sexual Activity   Alcohol use: Not Currently   Drug use: No   Sexual activity: Not on file  Other Topics Concern   Not on file  Social History Narrative   Lives with wife   Caffeine- 3-4 diet sodas a day   Social Drivers of Corporate investment banker  Strain: Not on file  Food Insecurity: Not on file  Transportation Needs: Not on file  Physical Activity: Not on file  Stress: Not on file  Social Connections: Not on file   Family History  Problem Relation Age of Onset   Migraines Mother    Colon cancer Mother    Hypertension Mother    Diabetes Mother    Heart disease Father    Diabetes Paternal Grandmother    Heart disease Paternal Grandmother    Prostate cancer Paternal Grandfather    Healthy Other    Coronary artery disease Other        Deceased age 79   Esophageal cancer Neg Hx    Rectal cancer Neg Hx    Stomach cancer Neg Hx    Allergies  Allergen Reactions   Atorvastatin Other (See Comments)    Muscle soreness  Muscle soreness  Muscle soreness  Muscle soreness, Muscle soreness   Budesonide-Formoterol Fumarate Other (See Comments)    Tongue/throat soreness   Metformin Hcl Diarrhea    Other reaction(s): Diarhea   Current Outpatient Medications  Medication Sig Dispense Refill   acetaminophen (  TYLENOL) 500 MG tablet Take 1 tablet (500 mg total) by mouth every 8 (eight) hours for 10 days. 30 tablet 0   albuterol (PROVENTIL) (2.5 MG/3ML) 0.083% nebulizer solution Take 2.5 mg by nebulization 4 (four) times daily as needed for wheezing or shortness of breath.     albuterol (VENTOLIN HFA) 108 (90 Base) MCG/ACT inhaler Can inhale two puffs every four to six hours as needed for cough or wheeze. 18 g 0   ALPRAZolam (XANAX) 0.5 MG tablet Take 1 tablet (0.5 mg total) by mouth at bedtime as needed for anxiety. 30 tablet 1   aspirin EC 325 MG tablet Take 1 tablet (325 mg total) by mouth daily. 30 tablet 0   azelastine (ASTELIN) 0.1 % nasal spray Can use one to two sprays in each nostril one to two times daily as directed. (Patient not taking: Reported on 05/06/2023) 30 mL 5   busPIRone (BUSPAR) 10 MG tablet TAKE 2 TABLETS BY MOUTH 2 TIMES DAILY. 240 tablet 0   celecoxib (CELEBREX) 200 MG capsule TAKE 1 CAPSULE (200 MG TOTAL) BY  MOUTH 2 (TWO) TIMES DAILY BETWEEN MEALS AS NEEDED. (Patient not taking: Reported on 05/06/2023) 60 capsule 1   doxepin (SINEQUAN) 10 MG capsule Take 1 capsule (10 mg total) by mouth at bedtime. 30 capsule 0   Erenumab-aooe (AIMOVIG) 70 MG/ML SOAJ INJECT 70 MG INTO THE SKIN EVERY 30 DAYS (Patient not taking: Reported on 05/06/2023) 1 mL 1   famotidine (PEPCID) 40 MG tablet TAKE ONE TABLET ONCE EACH EVENING 30 tablet 0   fenofibrate micronized (ANTARA) 43 MG capsule Take 43 mg by mouth daily.     gabapentin (NEURONTIN) 300 MG capsule TAKE 2 CAPSULES BY MOUTH 2 TIMES DAILY. 120 capsule 3   HUMALOG 100 UNIT/ML injection Inject 0-200 Units into the skin See admin instructions. Used with Omnipod, fill with 200 units every 3 days     HYDROcodone-acetaminophen (NORCO) 7.5-325 MG tablet Take 1 tablet by mouth every 6 (six) hours as needed for moderate pain. (Patient not taking: Reported on 05/06/2023) 30 tablet 0   ibuprofen (ADVIL) 800 MG tablet Take 1 tablet (800 mg total) by mouth every 8 (eight) hours for 10 days. Please take with food, please alternate with acetaminophen 30 tablet 0   JARDIANCE 25 MG TABS tablet Take 25 mg by mouth daily.     L-Methylfolate 15 MG TABS Take 15 mg by mouth daily.     lisinopril-hydrochlorothiazide (ZESTORETIC) 20-12.5 MG tablet Take 1 tablet by mouth daily.     MOUNJARO 10 MG/0.5ML Pen Inject 10 mg into the skin once a week.     oxyCODONE (ROXICODONE) 5 MG immediate release tablet Take 1 tablet (5 mg total) by mouth every 4 (four) hours as needed for severe pain (pain score 7-10) or breakthrough pain. 10 tablet 0   pantoprazole (PROTONIX) 40 MG tablet TAKE TWO TABLETS EVERY MORNING AS DIRECTED. 60 tablet 0   pramipexole (MIRAPEX) 1.5 MG tablet Take 1 tablet (1.5 mg total) by mouth daily. 45 tablet 1   predniSONE (DELTASONE) 20 MG tablet Take 1 tablet (20 mg total) by mouth daily with breakfast. (Patient not taking: Reported on 05/06/2023) 6 tablet 0   rizatriptan (MAXALT) 10  MG tablet Take 10 mg by mouth as needed for migraine.     simvastatin (ZOCOR) 40 MG tablet Take 40 mg by mouth every evening.     SUMAtriptan (IMITREX) 100 MG tablet Take 1 tablet (100 mg total) by mouth every  2 (two) hours as needed for migraine. May repeat in 2 hours if headache persists or recurs. (Patient not taking: Reported on 05/06/2023) 10 tablet 2   SYMBICORT 160-4.5 MCG/ACT inhaler INHALE TWO PUFFS TWICE DAILY TO PREVENT COUGH OR WHEEZE. RINSE MOUTH AFTER USE. (Patient not taking: Reported on 05/06/2023) 10.2 each 0   TRINTELLIX 20 MG TABS tablet Take 20 mg by mouth daily.     VITAMIN D PO Take 5,000 Units by mouth daily.     zolpidem (AMBIEN) 10 MG tablet Take 10 mg by mouth at bedtime.     No current facility-administered medications for this visit.   No results found.  Review of Systems:   A ROS was performed including pertinent positives and negatives as documented in the HPI.   Musculoskeletal Exam:    There were no vitals taken for this visit.  Right knee incisions are well-appearing without erythema or drainage.  Range of motion is from 0 to 100 degrees.  Distal neurosensory exam is intact  Imaging:      I personally reviewed and interpreted the radiographs.   Assessment:   2-week status post right knee arthroscopic lysis of adhesions doing well.  I would like him to work aggressively with physical therapy to progress his range of motion of flexion.  I will plan to see him back in 4 weeks for reassessment  Plan :    - 4 weeks for reassessment      I personally saw and evaluated the patient, and participated in the management and treatment plan.  Huel Cote, MD Attending Physician, Orthopedic Surgery  This document was dictated using Dragon voice recognition software. A reasonable attempt at proof reading has been made to minimize errors.

## 2023-05-25 ENCOUNTER — Ambulatory Visit: Admitting: Physical Therapy

## 2023-05-25 ENCOUNTER — Encounter: Payer: Self-pay | Admitting: Physical Therapy

## 2023-05-25 DIAGNOSIS — M25561 Pain in right knee: Secondary | ICD-10-CM

## 2023-05-25 DIAGNOSIS — Z9889 Other specified postprocedural states: Secondary | ICD-10-CM

## 2023-05-25 DIAGNOSIS — M25661 Stiffness of right knee, not elsewhere classified: Secondary | ICD-10-CM

## 2023-05-25 NOTE — Therapy (Signed)
 OUTPATIENT PHYSICAL THERAPY LOWER EXTREMITY    Patient Name: Donald Payne MRN: [id number] DOB:08/09/1965, 58 y.o., male Today's Date: 05/25/2023  END OF SESSION:  PT End of Session - 05/25/23 1737     Visit Number 2    Date for PT Re-Evaluation 08/10/23    Authorization Type [employer]    PT Start Time 1738    PT Stop Time 1825    PT Time Calculation (min) 47 min    Activity Tolerance Patient tolerated treatment well    Behavior During Therapy WFL for tasks assessed/performed             Past Medical History:  Diagnosis Date   Allergic rhinitis    Allergy    Anxiety    Asthma    Depression    Diabetes mellitus (HCC)    Dyslipidemia    Fatigue    GERD (gastroesophageal reflux disease)    Headache    Hemorrhoids    Hyperlipidemia    Hypertension    Hypoxia    IBS (irritable bowel syndrome)    Low testosterone    Prostatitis    Sleep apnea    uses CPAP   Sleep apnea with hypersomnolence    Vitamin D deficiency    Past Surgical History:  Procedure Laterality Date   KNEE ARTHROSCOPY Right    KNEE ARTHROSCOPY WITH MENISCAL REPAIR Right 05/12/2023   Procedure: ARTHROSCOPY, KNEE, WITH MENISCUS REPAIR;  Surgeon: Wilhelmenia Harada, MD;  Location: [facility] OR;  Service: Orthopedics;  Laterality: Right;  RIGHT KNEE ARTHROSCOPY WITH MEDIAL MENISCUS REPAIR AND LYSIS OF ADHESIONS   WISDOM TOOTH EXTRACTION     Patient Active Problem List   Diagnosis Date Noted   Arthrofibrosis of knee joint, right 05/12/2023   Hypertensive heart disease 07/06/2018   Asthma 07/06/2018   Sleep apnea 07/06/2018   Morbid obesity (HCC) 07/06/2018   Dyspnea 10/27/2011    PCP: Rae Bugler  REFERRING PROVIDER: Wilhelmenia Harada  REFERRING DIAG:  [phone] (ICD-10-CM) - Complex tear of medial meniscus of right knee as current injury, subsequent encounter    THERAPY DIAG:  Acute pain of right knee  Stiffness of right knee, not elsewhere classified  S/P lateral meniscus repair of right  knee  Rationale for Evaluation and Treatment: Rehabilitation  ONSET DATE: 05/12/23  SUBJECTIVE: Walked in with impaired gait with L lateral shift and circumduction. Has been doing some of his HEP. He has trouble standing up from a lower chair but isn't otherwise limited in activities.    SUBJECTIVE STATEMENT: I am really stiff today, pain a 2/10, very tight  PERTINENT HISTORY: R arthroscopy with medial meniscal repair 05/12/23 DM, depression, HTN, anxiety, obesity   PAIN:  Are you having pain? 0/10 before tx 3/10 after tx PRECAUTIONS: Knee  RED FLAGS: None   WEIGHT BEARING RESTRICTIONS: No  FALLS:  Has patient fallen in last 6 months? 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  DIAGNOSTIC FINDINGS: IMPRESSION: 1. Intrasubstance degeneration within junction of the body and  posterior horn of the medial meniscus and within the body of the lateral meniscus. No tear is seen extending through an articular surface of either meniscus. 2. Mild-to-moderate mucoid degeneration of the ACL (ACL cyst). No ACL tear is seen. 3. Mild tricompartmental cartilage degenerative changes. 4. Moderate joint effusion.   COGNITION: Overall cognitive status: Within functional limits for tasks assessed     SENSATION: WFL  EDEMA:  Swelling around R  MUSCLE LENGTH: Hamstrings: tightness in R hamstring  PALPATION: Pain with flexion PROM  LOWER EXTREMITY ROM:  Active ROM Right Eval Seated  Left eval AROM  Right  05/25/23   Hip flexion     Hip extension     Hip abduction     Hip adduction     Hip internal rotation     Hip external rotation     Knee flexion 86 w/pain  95  Knee extension 15   10  Ankle dorsiflexion     Ankle plantarflexion     Ankle inversion     Ankle eversion      (Blank rows = not tested)  LOWER EXTREMITY MMT:  MMT Right eval Left eval  Hip flexion 5   Hip extension    Hip abduction    Hip adduction    Hip internal rotation    Hip external rotation    Knee flexion 4+   Knee extension 5   Ankle dorsiflexion    Ankle plantarflexion    Ankle inversion    Ankle eversion     (Blank rows = not tested)   FUNCTIONAL TESTS:  5 times sit to stand: 19.54s from elevated mat table  Timed up and go (TUG): 12.78s antalgic   GAIT: Distance walked: in clinic distances  Assistive device utilized: None Level of assistance: Modified independence Comments: antalgic gait, slowed speed, decreased knee flexion                                                                          TREATMENT DATE:  05/25/23 Nustep level 3 x 6 minutes LE only Bike partial revs x 4 minutes some partial revs able to do a few full revs Leg press 20# x10 both legs, no weight right only, then 20# right only with small ROM PROM some joint mobs CAlf stretches Resisted gait with tband fwd and backward On airex weight shift and march Practiced walking with cues for step length and a natural bend to the knee, tends to keep knee stiff and circumduct the leg Vaso medium pressure 35 degrees  05/21/23 Nustep L4 PROM with end range hold  Joint knee mobs ant & post QS x10 SLR x10 Seated knee ext x10 Sit to stand 2x10 Gait emphasize Hip flexion and heel strike 7ft PF stretch 6in step up x6, too much compensation with circumduction, switched to 4in x10 2in step up on airex pad 2x10    05/18/23- EVAL, HEP    PATIENT EDUCATION:  Education details: POC, HEP, icing and elevating Person educated:  Patient Education method: Explanation Education comprehension: verbalized understanding  HOME EXERCISE PROGRAM: Access Code: [email or web address] URL: [email or web address] Date: 05/18/2023 Prepared by: Cassie Freer  Exercises - Supine Quad Set  - 1 x daily - 7  x weekly - 2 sets - 10 reps - Supine Heel Slide with Strap  - 1 x daily - 7 x weekly - 2 sets - 10 reps - Sit to Stand  - 1 x daily - 7 x weekly - 2 sets - 10 reps - Seated Long Arc Quad  - 1 x daily - 7 x weekly - 2 sets - 10 reps  ASSESSMENT:  CLINICAL IMPRESSION:  Patient frustrated when he arrived reports very stiff and tight in the knee, we did some Nustep and the bike and he started moving a little better, then after Passive stretch he needed a few cues for gait and he was walking much better.  I did add some proprioception and did the vaso to help with swelling  Patient is a 58 y.o. male who was seen today for physical therapy evaluation and treatment for R knee pain s/p mensicus surgery on 4/1. His biggest limitation is knee flexion, he is also missing some TKE. Patient reports low pain levels. He is walking with antalgic gait, no AD and has an ace wrap bandage around his incision. Has a follow up with his doctor 05/21/23. Patient will benefit from skilled PT to address his R knee deficits to return to PLOF.   OBJECTIVE IMPAIRMENTS: Abnormal gait, decreased mobility, difficulty walking, decreased ROM, decreased strength, and pain.   ACTIVITY LIMITATIONS: squatting, stairs, transfers, and locomotion level  PARTICIPATION LIMITATIONS: shopping, community activity, occupation, and yard work  PERSONAL FACTORS: Age and Fitness are also affecting patient's functional outcome.   REHAB POTENTIAL: Good  CLINICAL DECISION MAKING: Stable/uncomplicated  EVALUATION COMPLEXITY: Low  GOALS: Goals reviewed with patient? Yes   SHORT TERM GOALS: Target date: 06/29/23  Independent with initial HEP. Baseline: given 05/18/23 Goal  status: INITIAL   2. Patient will demonstrate improved functional LE strength by completing 5x STS in <15 seconds. Baseline: 19.54s from elevated mat  Goal status: INITIAL   LONG TERM GOALS: Target date: 08/10/23  Independent with advanced/ongoing HEP to improve outcomes and carryover.  Baseline:  Goal status: INITIAL  2.  KRISTINA BERTONE will demonstrate R knee flexion to 120 deg to ascend/descend stairs. Baseline: 86 w/pain Goal status: INITIAL  3.  Marijean Shouts Semmel will demonstrate full R knee extension for safety with gait. Baseline: 15 Goal status: INITIAL  4.  DELLIS VOGHT will be able to ambulate at least 600' safely with LRAD and normal gait pattern to access community.  Baseline: antalgic gait Goal status: INITIAL  5.  ZACKARIE CHASON will be able to put his socks on and off.  Baseline: unable to do Goal status: INITIAL    PLAN:  PT FREQUENCY: 2x/week  PT DURATION: 12 weeks  PLANNED INTERVENTIONS: 97110-Therapeutic exercises, 97530- Therapeutic activity, 97112- Neuromuscular re-education, 97535- Self Care, 09811- Manual therapy, [phone]- Gait training, [phone]- Vasopneumatic device, Patient/Family education, Balance training, Stair training, Taping, Dry Needling, Joint mobilization, and Cryotherapy  PLAN FOR NEXT SESSION: R knee strengthening, PROM, functional tasks- steps, STS, gait    Haiven Nardone W, PT 05/25/2023, 6:21 PM [facility] [address] [facility]. [place], [place], [address] Phone: [phone]   Fax:  [phone]

## 2023-05-28 ENCOUNTER — Ambulatory Visit

## 2023-05-28 DIAGNOSIS — M25661 Stiffness of right knee, not elsewhere classified: Secondary | ICD-10-CM

## 2023-05-28 DIAGNOSIS — Z9889 Other specified postprocedural states: Secondary | ICD-10-CM

## 2023-05-28 DIAGNOSIS — M25561 Pain in right knee: Secondary | ICD-10-CM

## 2023-05-28 NOTE — Therapy (Signed)
OUTPATIENT PHYSICAL THERAPY LOWER EXTREMITY    Patient Name: Donald Payne MRN: [id number] DOB:[birth year], 58 y.o., male Today's Date: 05/28/2023  END OF SESSION:  PT End of Session - 05/28/23 1709     Visit Number 3    Date for PT Re-Evaluation 08/10/23    Authorization Type U[employer]    PT Start Time 1710    PT Stop Time 1800    PT Time Calculation (min) 50 min    Activity Tolerance Patient tolerated treatment well    Behavior During Therapy WFL for tasks assessed/performed              Past Medical History:  Diagnosis Date   Allergic rhinitis    Allergy    Anxiety    Asthma    Depression    Diabetes mellitus (HCC)    Dyslipidemia    Fatigue    GERD (gastroesophageal reflux disease)    Headache    Hemorrhoids    Hyperlipidemia    Hypertension    Hypoxia    IBS (irritable bowel syndrome)    Low testosterone    Prostatitis    Sleep apnea    uses CPAP   Sleep apnea with hypersomnolence    Vitamin D deficiency    Past Surgical History:  Procedure Laterality Date   KNEE ARTHROSCOPY Right    KNEE ARTHROSCOPY WITH MENISCAL REPAIR Right 05/12/2023   Procedure: ARTHROSCOPY, KNEE, WITH MENISCUS REPAIR;  Surgeon: Huel Cote, MD;  Location: [facility] OR;  Service: Orthopedics;  Laterality: Right;  RIGHT KNEE ARTHROSCOPY WITH MEDIAL MENISCUS REPAIR AND LYSIS OF ADHESIONS   WISDOM TOOTH EXTRACTION     Patient Active Problem List   Diagnosis Date Noted   Arthrofibrosis of knee joint, right 05/12/2023   Hypertensive heart disease 07/06/2018   Asthma 07/06/2018   Sleep apnea 07/06/2018   Morbid obesity (HCC) 07/06/2018   Dyspnea 10/27/2011    PCP: [name]  REFERRING PROVIDER: Huel Cote  REFERRING DIAG:  [phone] (ICD-10-CM) - Complex tear of medial meniscus of right knee as current injury, subsequent encounter    THERAPY DIAG:  S/P lateral meniscus repair of right knee  Stiffness of right knee, not elsewhere classified  Acute pain of right  knee  Rationale for Evaluation and Treatment: Rehabilitation  ONSET DATE: 05/12/23  SUBJECTIVE: Walked in with impaired gait with L lateral shift and circumduction. Has been doing some of his HEP. He has trouble standing up from a lower chair but isn't otherwise limited in activities.    SUBJECTIVE STATEMENT: Can walk a little better, still a bit of pain if I bend too much.   PERTINENT HISTORY: R arthroscopy with medial meniscal repair 05/12/23 DM, depression, HTN, anxiety, obesity   PAIN:  Are you having pain? 0/10 before tx 3/10 after tx PRECAUTIONS: Knee  RED FLAGS: None   WEIGHT BEARING RESTRICTIONS: No  FALLS:  Has patient fallen in last 6 months? 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  DIAGNOSTIC FINDINGS: IMPRESSION: 1. Intrasubstance  degeneration within junction of the body and posterior horn of the medial meniscus and within the body of the lateral meniscus. No tear is seen extending through an articular surface of either meniscus. 2. Mild-to-moderate mucoid degeneration of the ACL (ACL cyst). No ACL tear is seen. 3. Mild tricompartmental cartilage degenerative changes. 4. Moderate joint effusion.   COGNITION: Overall cognitive status: Within functional limits for tasks assessed     SENSATION: WFL  EDEMA:  Swelling around R  MUSCLE LENGTH: Hamstrings: tightness in R hamstring  PALPATION: Pain with flexion PROM  LOWER EXTREMITY ROM:  Active ROM Right Eval Seated  Left eval AROM   Right  05/25/23  Hip flexion     Hip extension     Hip abduction     Hip adduction     Hip internal rotation     Hip external rotation     Knee flexion 86 w/pain  95  Knee extension 15   10  Ankle dorsiflexion     Ankle plantarflexion     Ankle inversion     Ankle eversion      (Blank rows = not tested)  LOWER EXTREMITY MMT:  MMT Right eval Left eval  Hip flexion 5   Hip extension    Hip abduction    Hip adduction    Hip internal rotation    Hip external rotation    Knee flexion 4+   Knee extension 5   Ankle dorsiflexion    Ankle plantarflexion    Ankle inversion    Ankle eversion     (Blank rows = not tested)   FUNCTIONAL TESTS:  5 times sit to stand: 19.54s from elevated mat table  Timed up and go (TUG): 12.78s antalgic   GAIT: Distance walked: in clinic distances  Assistive device utilized: None Level of assistance: Modified independence Comments: antalgic gait, slowed speed, decreased knee flexion                                                                          TREATMENT DATE:  05/28/22 NuStep L4x71mins LE only Calf stretch 30s  Calf raises 2x10  PROM to R knee end range holds, patellar mobs  LAQ 3# 2x10 HS curls green 2x10 STS elevated table 2x10 Step ups 4"  SLR 2# 2x10 SAQ 2# 2x10 Vaso high pressure 35 degrees  05/25/23 Nustep level 3 x 6 minutes LE only Bike partial revs x 4 minutes some partial revs able to do a few full revs Leg press 20# x10 both legs, no weight right only, then 20# right only with small ROM PROM some joint mobs CAlf stretches Resisted gait with tband fwd and backward On airex weight shift and march Practiced walking with cues for step length and a natural bend to the knee, tends to keep knee stiff and circumduct the leg Vaso medium pressure 35 degrees  05/21/23 Nustep L4 PROM with end range hold  Joint knee mobs ant & post QS x10 SLR x10 Seated knee ext x10 Sit to stand 2x10 Gait emphasize Hip  flexion and heel strike 73ft PF stretch 6in step up x6, too much compensation with circumduction, switched to 4in x10 2in step up on airex pad 2x10  05/18/23- EVAL, HEP    PATIENT EDUCATION:  Education details: POC, HEP, icing and elevating Person educated: Patient Education method: Explanation Education comprehension: verbalized understanding  HOME EXERCISE PROGRAM: Access Code: [email or web address] URL: [email or web address] Date: 05/18/2023 Prepared by: Cassie Freer  Exercises - Supine Quad Set  - 1 x daily - 7 x weekly - 2 sets - 10 reps - Supine Heel Slide with Strap  - 1 x daily - 7 x weekly - 2 sets - 10 reps - Sit to Stand  - 1 x daily - 7 x weekly - 2 sets - 10 reps - Seated Long Arc Quad  - 1 x daily - 7 x weekly - 2 sets - 10 reps  ASSESSMENT:  CLINICAL IMPRESSION: Some pain with passive knee flexion, this is still his biggest limitation. Reports he still has some difficulty with stairs especially coming down. Does well with step ups but does so with stiff leg. Requested to do vaso again as he states it helped with the swelling. Continue to push for flexion ROM.    Patient is a 58 y.o. male who was seen today for physical therapy evaluation and treatment for R knee pain s/p mensicus surgery on 4/1. His biggest limitation is knee flexion, he is also missing some TKE. Patient reports low pain levels. He is walking with antalgic gait, no AD and has an ace wrap bandage around his incision. Has a follow up with his doctor 05/21/23. Patient will benefit from skilled PT to address his R knee deficits to return to PLOF.   OBJECTIVE IMPAIRMENTS: Abnormal gait, decreased mobility, difficulty walking, decreased ROM, decreased strength, and pain.   ACTIVITY LIMITATIONS: squatting, stairs, transfers, and locomotion level  PARTICIPATION LIMITATIONS: shopping, community activity, occupation, and yard work  PERSONAL FACTORS: Age and Fitness are also affecting patient's functional  outcome.   REHAB POTENTIAL: Good  CLINICAL DECISION MAKING: Stable/uncomplicated  EVALUATION COMPLEXITY: Low  GOALS: Goals reviewed with patient? Yes   SHORT TERM GOALS: Target date: 06/29/23  Independent with initial HEP. Baseline: given 05/18/23 Goal status: INITIAL   2. Patient will demonstrate improved functional LE strength by completing 5x STS in <15 seconds. Baseline: 19.54s from elevated mat  Goal status: INITIAL   LONG TERM GOALS: Target date: 08/10/23  Independent with advanced/ongoing HEP to improve outcomes and carryover.  Baseline:  Goal status: INITIAL  2.  TRAYON KRANTZ will demonstrate R knee flexion to 120 deg to ascend/descend stairs. Baseline: 86 w/pain Goal status: INITIAL  3.  Napoleon Form Selway will demonstrate full R knee extension for safety with gait. Baseline: 15 Goal status: INITIAL  4.  SHIVA SAHAGIAN will be able to ambulate at least 600' safely with LRAD and normal gait pattern to access community.  Baseline: antalgic gait Goal status: INITIAL  5.  FERNANDO STOIBER will be able to put his socks on and off.  Baseline: unable to do Goal status: INITIAL    PLAN:  PT FREQUENCY: 2x/week  PT DURATION: 12 weeks  PLANNED INTERVENTIONS: 97110-Therapeutic exercises, 97530- Therapeutic activity, 97112- Neuromuscular re-education, 97535- Self Care, 40102- Manual therapy, [phone]- Gait training, [phone]- Vasopneumatic device, Patient/Family education, Balance training, Stair training, Taping, Dry Needling, Joint mobilization, and Cryotherapy  PLAN FOR NEXT SESSION: R knee strengthening, PROM, functional tasks- steps, STS, gait    Cassie Freer, PT 05/28/2023, 5:58 PM [facility] [address] [facility]. [place], [place], [address] Phone: [phone]   Fax:  [phone] 

## 2023-06-01 ENCOUNTER — Ambulatory Visit: Admitting: Physical Therapy

## 2023-06-01 ENCOUNTER — Encounter: Payer: Self-pay | Admitting: Physical Therapy

## 2023-06-01 DIAGNOSIS — M25661 Stiffness of right knee, not elsewhere classified: Secondary | ICD-10-CM

## 2023-06-01 DIAGNOSIS — M25561 Pain in right knee: Secondary | ICD-10-CM

## 2023-06-01 DIAGNOSIS — Z9889 Other specified postprocedural states: Secondary | ICD-10-CM

## 2023-06-01 NOTE — Therapy (Signed)
 OUTPATIENT PHYSICAL THERAPY LOWER EXTREMITY    Patient Name: Donald Payne MRN: [id number] DOB:02/04/1966, 58 y.o., male Today's Date: 06/01/2023  END OF SESSION:  PT End of Session - 06/01/23 1600     Visit Number 4    Date for PT Re-Evaluation 08/10/23    Authorization Type U[employer]    PT Start Time 1600    PT Stop Time 1645    PT Time Calculation (min) 45 min              Past Medical History:  Diagnosis Date   Allergic rhinitis    Allergy    Anxiety    Asthma    Depression    Diabetes mellitus (HCC)    Dyslipidemia    Fatigue    GERD (gastroesophageal reflux disease)    Headache    Hemorrhoids    Hyperlipidemia    Hypertension    Hypoxia    IBS (irritable bowel syndrome)    Low testosterone    Prostatitis    Sleep apnea    uses CPAP   Sleep apnea with hypersomnolence    Vitamin D deficiency    Past Surgical History:  Procedure Laterality Date   KNEE ARTHROSCOPY Right    KNEE ARTHROSCOPY WITH MENISCAL REPAIR Right 05/12/2023   Procedure: ARTHROSCOPY, KNEE, WITH MENISCUS REPAIR;  Surgeon: Wilhelmenia Harada, MD;  Location: [facility] OR;  Service: Orthopedics;  Laterality: Right;  RIGHT KNEE ARTHROSCOPY WITH MEDIAL MENISCUS REPAIR AND LYSIS OF ADHESIONS   WISDOM TOOTH EXTRACTION     Patient Active Problem List   Diagnosis Date Noted   Arthrofibrosis of knee joint, right 05/12/2023   Hypertensive heart disease 07/06/2018   Asthma 07/06/2018   Sleep apnea 07/06/2018   Morbid obesity (HCC) 07/06/2018   Dyspnea 10/27/2011    PCP: Rae Bugler  REFERRING PROVIDER: Wilhelmenia Harada  REFERRING DIAG:  [phone] (ICD-10-CM) - Complex tear of medial meniscus of right knee as current injury, subsequent encounter    THERAPY DIAG:  S/P lateral meniscus repair of right knee  Stiffness of right knee, not elsewhere classified  Acute pain of right knee  Rationale for Evaluation and Treatment: Rehabilitation  ONSET DATE: 05/12/23  SUBJECTIVE:  He did a lot of movement  yesterday, and he walked up a lot of stairs today. Not any pain, but it is tight.   SUBJECTIVE STATEMENT: Can walk a little better, still a bit of pain if I bend too much.   PERTINENT HISTORY: R arthroscopy with medial meniscal repair 05/12/23 DM, depression, HTN, anxiety, obesity   PAIN:  Are you having pain? 0/10 before tx 3/10 after tx PRECAUTIONS: Knee  RED FLAGS: None   WEIGHT BEARING RESTRICTIONS: No  FALLS:  Has patient fallen in last 6 months? 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  DIAGNOSTIC FINDINGS: IMPRESSION: 1. Intrasubstance degeneration within junction of the body and posterior horn of the medial meniscus and within the body of the lateral meniscus. No tear is seen extending through an articular  surface of either meniscus. 2. Mild-to-moderate mucoid degeneration of the ACL (ACL cyst). No ACL tear is seen. 3. Mild tricompartmental cartilage degenerative changes. 4. Moderate joint effusion.   COGNITION: Overall cognitive status: Within functional limits for tasks assessed     SENSATION: WFL  EDEMA:  Swelling around R  MUSCLE LENGTH: Hamstrings: tightness in R hamstring  PALPATION: Pain with flexion PROM  LOWER EXTREMITY ROM:  Active ROM Right Eval Seated  Left eval AROM  Right  05/25/23  Hip flexion     Hip extension     Hip abduction     Hip adduction     Hip internal rotation     Hip external rotation     Knee flexion 86 w/pain  95  Knee extension 15   10  Ankle  dorsiflexion     Ankle plantarflexion     Ankle inversion     Ankle eversion      (Blank rows = not tested)  LOWER EXTREMITY MMT:  MMT Right eval Left eval  Hip flexion 5   Hip extension    Hip abduction    Hip adduction    Hip internal rotation    Hip external rotation    Knee flexion 4+   Knee extension 5   Ankle dorsiflexion    Ankle plantarflexion    Ankle inversion    Ankle eversion     (Blank rows = not tested)   FUNCTIONAL TESTS:  5 times sit to stand: 19.54s from elevated mat table  Timed up and go (TUG): 12.78s antalgic   GAIT: Distance walked: in clinic distances  Assistive device utilized: None Level of assistance: Modified independence Comments: antalgic gait, slowed speed, decreased knee flexion                                                                          TREATMENT DATE:  06/01/23 Bike attempt- unable due to decreased ROM patellar mobs Rt knee ant/post mobs PROM Rt knee flex/ ext LAQ 3# 2x10- hold at TKE S2S 2x10 HS curl green band x10    Black bar heel raise 2x10 6in step up x10 each leg- cue to avoid circumduction 4in step down rt leg on box 2x10 Vaso high pressure    05/28/22 NuStep L4x50mins LE only Calf stretch 30s  Calf raises 2x10  PROM to R knee end range holds, patellar mobs  LAQ 3# 2x10 HS curls green 2x10 STS elevated table 2x10 Step ups 4"  SLR 2# 2x10 SAQ 2# 2x10 Vaso high pressure 35 degrees  05/25/23 Nustep level 3 x 6 minutes LE only Bike partial revs x 4 minutes some partial revs able to do a few full revs Leg press 20# x10 both legs, no weight right only, then 20# right only with small ROM PROM some joint mobs CAlf stretches Resisted gait with tband fwd and backward On airex weight shift and march Practiced walking with cues for step length and a natural bend to the knee, tends to keep knee stiff and circumduct the leg Vaso medium pressure 35 degrees  05/21/23 Nustep L4 PROM with end range  hold  Joint knee mobs ant & post QS x10 SLR x10 Seated knee  ext x10 Sit to stand 2x10 Gait emphasize Hip flexion and heel strike 74ft PF stretch 6in step up x6, too much compensation with circumduction, switched to 4in x10 2in step up on airex pad 2x10    05/18/23- EVAL, HEP    PATIENT EDUCATION:  Education details: POC, HEP, icing and elevating Person educated: Patient Education method: Explanation Education comprehension: verbalized understanding  HOME EXERCISE PROGRAM: Access Code: [email or web address] URL: [email or web address] Date: 05/18/2023 Prepared by: Donavon Fudge  Exercises - Supine Quad Set  - 1 x daily - 7 x weekly - 2 sets - 10 reps - Supine Heel Slide with Strap  - 1 x daily - 7 x weekly - 2 sets - 10 reps - Sit to Stand  - 1 x daily - 7 x weekly - 2 sets - 10 reps - Seated Long Arc Quad  - 1 x daily - 7 x weekly - 2 sets - 10 reps  ASSESSMENT:  CLINICAL IMPRESSION: Pt. ambulated in with antalgic gait and circumduction.Attempted the bike for a warm-up, but he was unable due to lack of ROM. Some pain with passive knee flexion, this is still his biggest limitation. He did well with S2S and resisted LAQ. He is visibly stronger in his Rt quad. Reports he still has some difficulty with stairs especially coming down. Does well with step ups but requires cuing to reduce circumduction. During the SL step down, I kept a hand on his R knee for stability. Emphasis on knee flexion with stairs and gait. Requested to do vaso again as he states it helped with the swelling. Continue to push for flexion ROM.    Patient is a 58 y.o. male who was seen today for physical therapy evaluation and treatment for R knee pain s/p mensicus surgery on 4/1. His biggest limitation is knee flexion, he is also missing some TKE. Patient reports low pain levels. He is walking with antalgic gait, no AD and has an ace wrap bandage around his incision. Has a follow up with his doctor 05/21/23. Patient  will benefit from skilled PT to address his R knee deficits to return to PLOF.   OBJECTIVE IMPAIRMENTS: Abnormal gait, decreased mobility, difficulty walking, decreased ROM, decreased strength, and pain.   ACTIVITY LIMITATIONS: squatting, stairs, transfers, and locomotion level  PARTICIPATION LIMITATIONS: shopping, community activity, occupation, and yard work  PERSONAL FACTORS: Age and Fitness are also affecting patient's functional outcome.   REHAB POTENTIAL: Good  CLINICAL DECISION MAKING: Stable/uncomplicated  EVALUATION COMPLEXITY: Low  GOALS: Goals reviewed with patient? Yes   SHORT TERM GOALS: Target date: 06/29/23  Independent with initial HEP. Baseline: given 05/18/23 Goal status: INITIAL   2. Patient will demonstrate improved functional LE strength by completing 5x STS in <15 seconds. Baseline: 19.54s from elevated mat  Goal status: INITIAL   LONG TERM GOALS: Target date: 08/10/23  Independent with advanced/ongoing HEP to improve outcomes and carryover.  Baseline:  Goal status: INITIAL  2.  MANSEL STROTHER will demonstrate R knee flexion to 120 deg to ascend/descend stairs. Baseline: 86 w/pain Goal status: INITIAL  3.  Marijean Shouts Younes will demonstrate full R knee extension for safety with gait. Baseline: 15 Goal status: INITIAL  4.  DONATELLO KLEVE will be able to ambulate at least 600' safely with LRAD and normal gait pattern to access community.  Baseline: antalgic gait Goal status: INITIAL  5.  TAISON CELANI will be able to put his socks on and off.  Baseline: unable to  do Goal status: INITIAL    PLAN:  PT FREQUENCY: 2x/week  PT DURATION: 12 weeks  PLANNED INTERVENTIONS: 97110-Therapeutic exercises, 97530- Therapeutic activity, 97112- Neuromuscular re-education, 97535- Self Care, 44010- Manual therapy, [phone]- Gait training, [phone]- Vasopneumatic device, Patient/Family education, Balance training, Stair training, Taping, Dry Needling, Joint  mobilization, and Cryotherapy  PLAN FOR NEXT SESSION: R knee strengthening, emphasize R knee flexion PROM, functional tasks- steps, STS, gait    Laurelyn Ponder, SPTA 06/01/2023, 4:01 PM [facility] at [facility] [address] [facility] [place], [place], [address] Phone: [phone]   Fax:  [phone]

## 2023-06-03 ENCOUNTER — Other Ambulatory Visit (HOSPITAL_BASED_OUTPATIENT_CLINIC_OR_DEPARTMENT_OTHER): Payer: Self-pay | Admitting: Orthopaedic Surgery

## 2023-06-04 ENCOUNTER — Ambulatory Visit

## 2023-06-04 DIAGNOSIS — Z9889 Other specified postprocedural states: Secondary | ICD-10-CM

## 2023-06-04 DIAGNOSIS — M25561 Pain in right knee: Secondary | ICD-10-CM | POA: Diagnosis not present

## 2023-06-04 DIAGNOSIS — M25661 Stiffness of right knee, not elsewhere classified: Secondary | ICD-10-CM

## 2023-06-04 NOTE — Therapy (Signed)
 OUTPATIENT PHYSICAL THERAPY LOWER EXTREMITY    Patient Name: Donald Payne MRN: [id number] DOB:[birth year], 58 y.o., male Today's Date: 06/04/2023  END OF SESSION:  PT End of Session - 06/04/23 1712     Visit Number 5    Date for PT Re-Evaluation 08/10/23    Authorization Type [employer]    PT Start Time 1715    PT Stop Time 1800    PT Time Calculation (min) 45 min               Past Medical History:  Diagnosis Date   Allergic rhinitis    Allergy    Anxiety    Asthma    Depression    Diabetes mellitus (HCC)    Dyslipidemia    Fatigue    GERD (gastroesophageal reflux disease)    Headache    Hemorrhoids    Hyperlipidemia    Hypertension    Hypoxia    IBS (irritable bowel syndrome)    Low testosterone    Prostatitis    Sleep apnea    uses CPAP   Sleep apnea with hypersomnolence    Vitamin D deficiency    Past Surgical History:  Procedure Laterality Date   KNEE ARTHROSCOPY Right    KNEE ARTHROSCOPY WITH MENISCAL REPAIR Right 05/12/2023   Procedure: ARTHROSCOPY, KNEE, WITH MENISCUS REPAIR;  Surgeon: Wilhelmenia Harada, MD;  Location: [facility] OR;  Service: Orthopedics;  Laterality: Right;  RIGHT KNEE ARTHROSCOPY WITH MEDIAL MENISCUS REPAIR AND LYSIS OF ADHESIONS   WISDOM TOOTH EXTRACTION     Patient Active Problem List   Diagnosis Date Noted   Arthrofibrosis of knee joint, right 05/12/2023   Hypertensive heart disease 07/06/2018   Asthma 07/06/2018   Sleep apnea 07/06/2018   Morbid obesity (HCC) 07/06/2018   Dyspnea 10/27/2011    PCP: Rae Bugler  REFERRING PROVIDER: Wilhelmenia Harada  REFERRING DIAG:  [id number] (ICD-10-CM) - Complex tear of medial meniscus of right knee as current injury, subsequent encounter    THERAPY DIAG:  S/P lateral meniscus repair of right knee  Stiffness of right knee, not elsewhere classified  Acute pain of right knee  Rationale for Evaluation and Treatment: Rehabilitation  ONSET DATE: 05/12/23  SUBJECTIVE STATEMENT: Knee is  hurting a little bit, I think I did too much. I tried some weed eating. Also worked for the first time since surgery. My back is still hurting, I think it might be from the leg press the one time I did it.   PERTINENT HISTORY: R arthroscopy with medial meniscal repair 05/12/23 DM, depression, HTN, anxiety, obesity   PAIN:  Are you having pain? 0/10 before tx 3/10 after tx PRECAUTIONS: Knee  RED FLAGS: None   WEIGHT BEARING RESTRICTIONS: No  FALLS:  Has patient fallen in last 6 months? 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  DIAGNOSTIC FINDINGS: IMPRESSION: 1. Intrasubstance degeneration within junction of the body and posterior horn of the medial meniscus and within the body of the lateral meniscus. No tear is seen  extending through an articular surface of either meniscus. 2. Mild-to-moderate mucoid degeneration of the ACL (ACL cyst). No ACL tear is seen. 3. Mild tricompartmental cartilage degenerative changes. 4. Moderate joint effusion.   COGNITION: Overall cognitive status: Within functional limits for tasks assessed     SENSATION: WFL  EDEMA:  Swelling around R  MUSCLE LENGTH: Hamstrings: tightness in R hamstring  PALPATION: Pain with flexion PROM  LOWER EXTREMITY ROM:  Active ROM Right Eval Seated  Left eval AROM  Right  05/25/23  Hip flexion     Hip extension     Hip abduction     Hip adduction     Hip internal rotation     Hip external rotation     Knee flexion 86 w/pain  95   Knee extension 15   10  Ankle dorsiflexion     Ankle plantarflexion     Ankle inversion     Ankle eversion      (Blank rows = not tested)  LOWER EXTREMITY MMT:  MMT Right eval Left eval  Hip flexion 5   Hip extension    Hip abduction    Hip adduction    Hip internal rotation    Hip external rotation    Knee flexion 4+   Knee extension 5   Ankle dorsiflexion    Ankle plantarflexion    Ankle inversion    Ankle eversion     (Blank rows = not tested)   FUNCTIONAL TESTS:  5 times sit to stand: 19.54s from elevated mat table  Timed up and go (TUG): 12.78s antalgic   GAIT: Distance walked: in clinic distances  Assistive device utilized: None Level of assistance: Modified independence Comments: antalgic gait, slowed speed, decreased knee flexion                                                                          TREATMENT DATE:  06/04/23 NuStep L5x28mins  Bike ROM partial revs x3 mins PROM to R knee flex/ext  LAQ 3# 2x10 HS curls green 2x10 STM with theragun to R low back  STS x10 Step overs 4" Anterior heel tap 4" Calf stretch 30s on slant  Vaso high pressure  06/01/23 Bike attempt- unable due to decreased ROM patellar mobs Rt knee ant/post mobs PROM Rt knee flex/ ext LAQ 3# 2x10- hold at TKE S2S 2x10 HS curl green band x10    Black bar heel raise 2x10 6in step up x10 each leg- cue to avoid circumduction 4in step down rt leg on box 2x10 Vaso high pressure    05/28/22 NuStep L4x49mins LE only Calf stretch 30s  Calf raises 2x10  PROM to R knee end range holds, patellar mobs  LAQ 3# 2x10 HS curls green 2x10 STS elevated table 2x10 Step ups 4"  SLR 2# 2x10 SAQ 2# 2x10 Vaso high pressure 35 degrees  05/25/23 Nustep level 3 x 6 minutes LE only Bike partial revs x 4 minutes some partial revs able to do a few full revs Leg press 20# x10 both legs, no weight right only, then 20# right only with small ROM PROM some joint mobs CAlf  stretches Resisted gait with tband fwd and backward On airex  weight shift and march Practiced walking with cues for step length and a natural bend to the knee, tends to keep knee stiff and circumduct the leg Vaso medium pressure 35 degrees  05/21/23 Nustep L4 PROM with end range hold  Joint knee mobs ant & post QS x10 SLR x10 Seated knee ext x10 Sit to stand 2x10 Gait emphasize Hip flexion and heel strike 27ft PF stretch 6in step up x6, too much compensation with circumduction, switched to 4in x10 2in step up on airex pad 2x10    05/18/23- EVAL, HEP    PATIENT EDUCATION:  Education details: POC, HEP, icing and elevating Person educated: Patient Education method: Explanation Education comprehension: verbalized understanding  HOME EXERCISE PROGRAM: Access Code: [email or web address] URL: [email or web address] Date: 05/18/2023 Prepared by: Donavon Fudge  Exercises - Supine Quad Set  - 1 x daily - 7 x weekly - 2 sets - 10 reps - Supine Heel Slide with Strap  - 1 x daily - 7 x weekly - 2 sets - 10 reps - Sit to Stand  - 1 x daily - 7 x weekly - 2 sets - 10 reps - Seated Long Arc Quad  - 1 x daily - 7 x weekly - 2 sets - 10 reps  ASSESSMENT:  CLINICAL IMPRESSION: Pt. ambulated in with antalgic gait and circumduction. He has increased back pain today, may be from the way he is walking. Some pain with passive knee flexion, this is still his biggest limitation. He also has weakness in his hamstring.   Reports he still has some difficulty with stairs especially coming down. Does well with step ups but requires cuing to reduce circumduction. During the SL step down, I kept a hand on his R knee for stability. Emphasis on knee flexion with stairs and gait. Requested to do vaso again as he states it helped with the swelling. Continue to push for flexion ROM.    Patient is a 59 y.o. male who was seen today for physical therapy evaluation and treatment for R knee pain s/p mensicus  surgery on 4/1. His biggest limitation is knee flexion, he is also missing some TKE. Patient reports low pain levels. He is walking with antalgic gait, no AD and has an ace wrap bandage around his incision. Has a follow up with his doctor 05/21/23. Patient will benefit from skilled PT to address his R knee deficits to return to PLOF.   OBJECTIVE IMPAIRMENTS: Abnormal gait, decreased mobility, difficulty walking, decreased ROM, decreased strength, and pain.   ACTIVITY LIMITATIONS: squatting, stairs, transfers, and locomotion level  PARTICIPATION LIMITATIONS: shopping, community activity, occupation, and yard work  PERSONAL FACTORS: Age and Fitness are also affecting patient's functional outcome.   REHAB POTENTIAL: Good  CLINICAL DECISION MAKING: Stable/uncomplicated  EVALUATION COMPLEXITY: Low  GOALS: Goals reviewed with patient? Yes   SHORT TERM GOALS: Target date: 06/29/23  Independent with initial HEP. Baseline: given 05/18/23 Goal status: INITIAL   2. Patient will demonstrate improved functional LE strength by completing 5x STS in <15 seconds. Baseline: 19.54s from elevated mat  Goal status: INITIAL   LONG TERM GOALS: Target date: 08/10/23  Independent with advanced/ongoing HEP to improve outcomes and carryover.  Baseline:  Goal status: INITIAL  2.  Donald Payne will demonstrate R knee flexion to 120 deg to ascend/descend stairs. Baseline: 86 w/pain Goal status: INITIAL  3.  Donald Payne will demonstrate full R knee extension for safety with gait. Baseline: 15 Goal status: INITIAL  4.  Donald Payne will be able to ambulate at least 600' safely with LRAD and normal gait pattern to access community.  Baseline: antalgic gait Goal status: INITIAL  5.  Donald Payne will be able to put his socks on and off.  Baseline: unable to do Goal status: INITIAL    PLAN:  PT FREQUENCY: 2x/week  PT DURATION: 12 weeks  PLANNED INTERVENTIONS: 97110-Therapeutic  exercises, 97530- Therapeutic activity, 97112- Neuromuscular re-education, 97535- Self Care, 16109- Manual therapy, [phone]- Gait training, [phone]- Vasopneumatic device, Patient/Family education, Balance training, Stair training, Taping, Dry Needling, Joint mobilization, and Cryotherapy  PLAN FOR NEXT SESSION: R knee strengthening, emphasize R knee flexion PROM, functional tasks- steps, STS, gait    Donavon Fudge, PT, SPTA 06/04/2023, 6:06 PM [place] [facility] at [facility] [address] [place], [place], [address] Phone: [phone]   Fax:  [phone]

## 2023-06-09 NOTE — Therapy (Signed)
 OUTPATIENT PHYSICAL THERAPY LOWER EXTREMITY    Patient Name: Donald Payne MRN: [id number] DOB:[birth year], 58 y.o., male Today's Date: 06/10/2023  END OF SESSION:  PT End of Session - 06/10/23 1527     Visit Number 6    Date for PT Re-Evaluation 08/10/23    Authorization Type U[employer]    PT Start Time 1530    PT Stop Time 1615    PT Time Calculation (min) 45 min                Past Medical History:  Diagnosis Date   Allergic rhinitis    Allergy    Anxiety    Asthma    Depression    Diabetes mellitus (HCC)    Dyslipidemia    Fatigue    GERD (gastroesophageal reflux disease)    Headache    Hemorrhoids    Hyperlipidemia    Hypertension    Hypoxia    IBS (irritable bowel syndrome)    Low testosterone    Prostatitis    Sleep apnea    uses CPAP   Sleep apnea with hypersomnolence    Vitamin D deficiency    Past Surgical History:  Procedure Laterality Date   KNEE ARTHROSCOPY Right    KNEE ARTHROSCOPY WITH MENISCAL REPAIR Right 05/12/2023   Procedure: ARTHROSCOPY, KNEE, WITH MENISCUS REPAIR;  Surgeon: Wilhelmenia Harada, MD;  Location: [facility] OR;  Service: Orthopedics;  Laterality: Right;  RIGHT KNEE ARTHROSCOPY WITH MEDIAL MENISCUS REPAIR AND LYSIS OF ADHESIONS   WISDOM TOOTH EXTRACTION     Patient Active Problem List   Diagnosis Date Noted   Arthrofibrosis of knee joint, right 05/12/2023   Hypertensive heart disease 07/06/2018   Asthma 07/06/2018   Sleep apnea 07/06/2018   Morbid obesity (HCC) 07/06/2018   Dyspnea 10/27/2011    PCP: Rae Bugler  REFERRING PROVIDER: Wilhelmenia Harada  REFERRING DIAG:  [phone] (ICD-10-CM) - Complex tear of medial meniscus of right knee as current injury, subsequent encounter    THERAPY DIAG:  S/P lateral meniscus repair of right knee  Stiffness of right knee, not elsewhere classified  Acute pain of right knee  Rationale for Evaluation and Treatment: Rehabilitation  ONSET DATE: 05/12/23  SUBJECTIVE STATEMENT: My knee is  hurting today some, not very flexible. Some days I get discouraged because one day I can do stairs and then 2 days later I can't do much. It is aggravating   PERTINENT HISTORY: R arthroscopy with medial meniscal repair 05/12/23 DM, depression, HTN, anxiety, obesity   PAIN:  Are you having pain? 0/10 before tx 3/10 after tx PRECAUTIONS: Knee  RED FLAGS: None   WEIGHT BEARING RESTRICTIONS: No  FALLS:  Has patient fallen in last 6 months? 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  DIAGNOSTIC FINDINGS: IMPRESSION: 1. Intrasubstance degeneration within junction of the body and posterior horn of the medial meniscus and within the body of the lateral meniscus. No tear is seen extending through an articular surface of either meniscus. 2. Mild-to-moderate mucoid  degeneration of the ACL (ACL cyst). No ACL tear is seen. 3. Mild tricompartmental cartilage degenerative changes. 4. Moderate joint effusion.   COGNITION: Overall cognitive status: Within functional limits for tasks assessed     SENSATION: WFL  EDEMA:  Swelling around R  MUSCLE LENGTH: Hamstrings: tightness in R hamstring  PALPATION: Pain with flexion PROM  LOWER EXTREMITY ROM:  Active ROM Right Eval Seated  Left eval AROM  Right  05/25/23  Hip flexion     Hip extension     Hip abduction     Hip adduction     Hip internal rotation     Hip external rotation     Knee flexion 86 w/pain  95  Knee extension 15   10  Ankle dorsiflexion      Ankle plantarflexion     Ankle inversion     Ankle eversion      (Blank rows = not tested)  LOWER EXTREMITY MMT:  MMT Right eval Left eval  Hip flexion 5   Hip extension    Hip abduction    Hip adduction    Hip internal rotation    Hip external rotation    Knee flexion 4+   Knee extension 5   Ankle dorsiflexion    Ankle plantarflexion    Ankle inversion    Ankle eversion     (Blank rows = not tested)   FUNCTIONAL TESTS:  5 times sit to stand: 19.54s from elevated mat table  Timed up and go (TUG): 12.78s antalgic   GAIT: Distance walked: in clinic distances  Assistive device utilized: None Level of assistance: Modified independence Comments: antalgic gait, slowed speed, decreased knee flexion                                                                          TREATMENT DATE:  06/10/23 PROM R knee flex/ext  NuStep L5x76mins Step ups forward and laterally Calf raises 2x12  Calf stretch 15s x2 Resisted gait 20# 3 way x5  Leg press 20# 2x10  Vaso high pressure  06/04/23 NuStep L5x17mins  Bike ROM partial revs x3 mins PROM to R knee flex/ext  LAQ 3# 2x10 HS curls green 2x10 STM with theragun to R low back  STS x10 Step overs 4" Anterior heel tap 4" Calf stretch 30s on slant  Vaso high pressure  06/01/23 Bike attempt- unable due to decreased ROM patellar mobs Rt knee ant/post mobs PROM Rt knee flex/ ext LAQ 3# 2x10- hold at TKE S2S 2x10 HS curl green band x10    Black bar heel raise 2x10 6in step up x10 each leg- cue to avoid circumduction 4in step down rt leg on box 2x10 Vaso high pressure    05/28/22 NuStep L4x61mins LE only Calf stretch 30s  Calf raises 2x10  PROM to R knee end range holds, patellar mobs  LAQ 3# 2x10 HS curls green 2x10 STS elevated table 2x10 Step ups 4"  SLR 2# 2x10 SAQ 2# 2x10 Vaso high pressure 35 degrees  05/25/23 Nustep level 3 x 6 minutes LE only Bike partial revs x 4 minutes some partial  revs able to do a few full revs Leg press 20# x10 both  legs, no weight right only, then 20# right only with small ROM PROM some joint mobs CAlf stretches Resisted gait with tband fwd and backward On airex weight shift and march Practiced walking with cues for step length and a natural bend to the knee, tends to keep knee stiff and circumduct the leg Vaso medium pressure 35 degrees  05/21/23 Nustep L4 PROM with end range hold  Joint knee mobs ant & post QS x10 SLR x10 Seated knee ext x10 Sit to stand 2x10 Gait emphasize Hip flexion and heel strike 5ft PF stretch 6in step up x6, too much compensation with circumduction, switched to 4in x10 2in step up on airex pad 2x10    05/18/23- EVAL, HEP    PATIENT EDUCATION:  Education details: POC, HEP, icing and elevating Person educated: Patient Education method: Explanation Education comprehension: verbalized understanding  HOME EXERCISE PROGRAM: Access Code: [email or web address] URL: [email or web address] Date: 05/18/2023 Prepared by: Donavon Fudge  Exercises - Supine Quad Set  - 1 x daily - 7 x weekly - 2 sets - 10 reps - Supine Heel Slide with Strap  - 1 x daily - 7 x weekly - 2 sets - 10 reps - Sit to Stand  - 1 x daily - 7 x weekly - 2 sets - 10 reps - Seated Long Arc Quad  - 1 x daily - 7 x weekly - 2 sets - 10 reps  ASSESSMENT:  CLINICAL IMPRESSION: Pt. ambulated in with antalgic gait and circumduction. He has increased back pain today, may be from the way he is walking.  Still has pain with passive knee flexion, this is still his biggest limitation. He also has weakness in his hamstring.   Reports he still has some difficulty with stairs especially coming down. Does well with step ups but requires cuing to reduce circumduction. During the SL step down, I kept a hand on his R knee for stability. Emphasis on knee flexion with stairs and gait. Requested to do vaso again as he states it helped with the swelling.  Continue to push for flexion ROM.    Patient is a 58 y.o. male who was seen today for physical therapy evaluation and treatment for R knee pain s/p mensicus surgery on 4/1. His biggest limitation is knee flexion, he is also missing some TKE. Patient reports low pain levels. He is walking with antalgic gait, no AD and has an ace wrap bandage around his incision. Has a follow up with his doctor 05/21/23. Patient will benefit from skilled PT to address his R knee deficits to return to PLOF.   OBJECTIVE IMPAIRMENTS: Abnormal gait, decreased mobility, difficulty walking, decreased ROM, decreased strength, and pain.   ACTIVITY LIMITATIONS: squatting, stairs, transfers, and locomotion level  PARTICIPATION LIMITATIONS: shopping, community activity, occupation, and yard work  PERSONAL FACTORS: Age and Fitness are also affecting patient's functional outcome.   REHAB POTENTIAL: Good  CLINICAL DECISION MAKING: Stable/uncomplicated  EVALUATION COMPLEXITY: Low  GOALS: Goals reviewed with patient? Yes   SHORT TERM GOALS: Target date: 06/29/23  Independent with initial HEP. Baseline: given 05/18/23 Goal status: INITIAL   2. Patient will demonstrate improved functional LE strength by completing 5x STS in <15 seconds. Baseline: 19.54s from elevated mat  Goal status: INITIAL   LONG TERM GOALS: Target date: 08/10/23  Independent with advanced/ongoing HEP to improve outcomes and carryover.  Baseline:  Goal status: INITIAL  2.  Donald Payne will demonstrate R knee flexion to 120 deg to ascend/descend stairs. Baseline:  86 w/pain Goal status: INITIAL  3.  Donald Payne will demonstrate full R knee extension for safety with gait. Baseline: 15 Goal status: INITIAL  4.  Donald Payne will be able to ambulate at least 600' safely with LRAD and normal gait pattern to access community.  Baseline: antalgic gait Goal status: INITIAL  5.  Donald Payne will be able to put his socks on and off.   Baseline: unable to do Goal status: INITIAL    PLAN:  PT FREQUENCY: 2x/week  PT DURATION: 12 weeks  PLANNED INTERVENTIONS: 97110-Therapeutic exercises, 97530- Therapeutic activity, 97112- Neuromuscular re-education, 97535- Self Care, 16109- Manual therapy, [phone]- Gait training, [phone]- Vasopneumatic device, Patient/Family education, Balance training, Stair training, Taping, Dry Needling, Joint mobilization, and Cryotherapy  PLAN FOR NEXT SESSION: R knee strengthening, emphasize R knee flexion PROM, functional tasks- steps, STS, gait    Donavon Fudge, PT, SPTA 06/10/2023, 4:18 PM [place] [facility] at [facility] [place], [place], [address] Phone: [phone]   Fax:  [phone]

## 2023-06-10 ENCOUNTER — Ambulatory Visit

## 2023-06-10 DIAGNOSIS — Z9889 Other specified postprocedural states: Secondary | ICD-10-CM

## 2023-06-10 DIAGNOSIS — M25561 Pain in right knee: Secondary | ICD-10-CM | POA: Diagnosis not present

## 2023-06-10 DIAGNOSIS — M25661 Stiffness of right knee, not elsewhere classified: Secondary | ICD-10-CM

## 2023-06-15 ENCOUNTER — Ambulatory Visit: Attending: Orthopaedic Surgery

## 2023-06-15 DIAGNOSIS — Z9889 Other specified postprocedural states: Secondary | ICD-10-CM | POA: Insufficient documentation

## 2023-06-15 DIAGNOSIS — M25561 Pain in right knee: Secondary | ICD-10-CM | POA: Insufficient documentation

## 2023-06-15 DIAGNOSIS — M25661 Stiffness of right knee, not elsewhere classified: Secondary | ICD-10-CM | POA: Diagnosis present

## 2023-06-15 NOTE — Therapy (Signed)
 OUTPATIENT PHYSICAL THERAPY LOWER EXTREMITY    Patient Name: Donald Payne MRN: [id number] DOB:[birth year], 58 y.o., male Today's Date: 06/15/2023  END OF SESSION:  PT End of Session - 06/15/23 1653     Visit Number 7    Date for PT Re-Evaluation 08/10/23    Authorization Type [employer]    PT Start Time 1655    PT Stop Time 1740    PT Time Calculation (min) 45 min                 Past Medical History:  Diagnosis Date   Allergic rhinitis    Allergy    Anxiety    Asthma    Depression    Diabetes mellitus (HCC)    Dyslipidemia    Fatigue    GERD (gastroesophageal reflux disease)    Headache    Hemorrhoids    Hyperlipidemia    Hypertension    Hypoxia    IBS (irritable bowel syndrome)    Low testosterone    Prostatitis    Sleep apnea    uses CPAP   Sleep apnea with hypersomnolence    Vitamin D deficiency    Past Surgical History:  Procedure Laterality Date   KNEE ARTHROSCOPY Right    KNEE ARTHROSCOPY WITH MENISCAL REPAIR Right 05/12/2023   Procedure: ARTHROSCOPY, KNEE, WITH MENISCUS REPAIR;  Surgeon: Wilhelmenia Harada, MD;  Location: [facility] OR;  Service: Orthopedics;  Laterality: Right;  RIGHT KNEE ARTHROSCOPY WITH MEDIAL MENISCUS REPAIR AND LYSIS OF ADHESIONS   WISDOM TOOTH EXTRACTION     Patient Active Problem List   Diagnosis Date Noted   Arthrofibrosis of knee joint, right 05/12/2023   Hypertensive heart disease 07/06/2018   Asthma 07/06/2018   Sleep apnea 07/06/2018   Morbid obesity (HCC) 07/06/2018   Dyspnea 10/27/2011    PCP: Rae Bugler  REFERRING PROVIDER: Wilhelmenia Harada  REFERRING DIAG:  [phone] (ICD-10-CM) - Complex tear of medial meniscus of right knee as current injury, subsequent encounter    THERAPY DIAG:  S/P lateral meniscus repair of right knee  Stiffness of right knee, not elsewhere classified  Acute pain of right knee  Rationale for Evaluation and Treatment: Rehabilitation  ONSET DATE: 05/12/23  SUBJECTIVE STATEMENT: I am  alright. I went to the rec center and did some steps and the rower. Also got on the bike. My back still hurts but it is getting better.   PERTINENT HISTORY: R arthroscopy with medial meniscal repair 05/12/23 DM, depression, HTN, anxiety, obesity   PAIN:  Are you having pain? 0/10 before tx 3/10 after tx PRECAUTIONS: Knee  RED FLAGS: None   WEIGHT BEARING RESTRICTIONS: No  FALLS:  Has patient fallen in last 6 months? 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  DIAGNOSTIC FINDINGS: IMPRESSION: 1. Intrasubstance degeneration within junction of the body and posterior horn of the medial meniscus and within the body of the lateral meniscus. No tear is seen extending through an articular surface of either meniscus. 2. Mild-to-moderate mucoid degeneration of  the ACL (ACL cyst). No ACL tear is seen. 3. Mild tricompartmental cartilage degenerative changes. 4. Moderate joint effusion.   COGNITION: Overall cognitive status: Within functional limits for tasks assessed     SENSATION: WFL  EDEMA:  Swelling around R  MUSCLE LENGTH: Hamstrings: tightness in R hamstring  PALPATION: Pain with flexion PROM  LOWER EXTREMITY ROM:  Active ROM Right Eval Seated  Left eval AROM  Right  05/25/23  Hip flexion     Hip extension     Hip abduction     Hip adduction     Hip internal rotation     Hip external rotation     Knee flexion 86 w/pain  95  Knee extension 15   10  Ankle dorsiflexion     Ankle plantarflexion      Ankle inversion     Ankle eversion      (Blank rows = not tested)  LOWER EXTREMITY MMT:  MMT Right eval Left eval  Hip flexion 5   Hip extension    Hip abduction    Hip adduction    Hip internal rotation    Hip external rotation    Knee flexion 4+   Knee extension 5   Ankle dorsiflexion    Ankle plantarflexion    Ankle inversion    Ankle eversion     (Blank rows = not tested)   FUNCTIONAL TESTS:  5 times sit to stand: 19.54s from elevated mat table  Timed up and go (TUG): 12.78s antalgic   GAIT: Distance walked: in clinic distances  Assistive device utilized: None Level of assistance: Modified independence Comments: antalgic gait, slowed speed, decreased knee flexion                                                                          TREATMENT DATE:  06/15/23 NuStep L5x57mins  Step ups 6" Lateral heel taps 6" Anterior heel taps 6"- stopped after 2 attempts STS 2x10 HS curls 35# 2x10, RLE 20# x10  Leg ext 10# 2x10, RLE 5# x10 Leg press 30# 2x10 RLE, ROM x10  Vaso high pressure 10 mins  06/10/23 PROM R knee flex/ext  NuStep L5x58mins Step ups forward and laterally Calf raises 2x12  Calf stretch 15s x2 Resisted gait 20# 3 way x5  Leg press 20# 2x10  Vaso high pressure  06/04/23 NuStep L5x91mins  Bike ROM partial revs x3 mins PROM to R knee flex/ext  LAQ 3# 2x10 HS curls green 2x10 STM with theragun to R low back  STS x10 Step overs 4" Anterior heel tap 4" Calf stretch 30s on slant  Vaso high pressure  06/01/23 Bike attempt- unable due to decreased ROM patellar mobs Rt knee ant/post mobs PROM Rt knee flex/ ext LAQ 3# 2x10- hold at TKE S2S 2x10 HS curl green band x10    Black bar heel raise 2x10 6in step up x10 each leg- cue to avoid circumduction 4in step down rt leg on box 2x10 Vaso high pressure    05/28/22 NuStep L4x12mins LE only Calf stretch 30s  Calf raises 2x10  PROM to R knee end range holds, patellar mobs   LAQ 3# 2x10 HS curls green 2x10 STS elevated  table 2x10 Step ups 4"  SLR 2# 2x10 SAQ 2# 2x10 Vaso high pressure 35 degrees  05/25/23 Nustep level 3 x 6 minutes LE only Bike partial revs x 4 minutes some partial revs able to do a few full revs Leg press 20# x10 both legs, no weight right only, then 20# right only with small ROM PROM some joint mobs CAlf stretches Resisted gait with tband fwd and backward On airex weight shift and march Practiced walking with cues for step length and a natural bend to the knee, tends to keep knee stiff and circumduct the leg Vaso medium pressure 35 degrees  05/21/23 Nustep L4 PROM with end range hold  Joint knee mobs ant & post QS x10 SLR x10 Seated knee ext x10 Sit to stand 2x10 Gait emphasize Hip flexion and heel strike 69ft PF stretch 6in step up x6, too much compensation with circumduction, switched to 4in x10 2in step up on airex pad 2x10    05/18/23- EVAL, HEP    PATIENT EDUCATION:  Education details: POC, HEP, icing and elevating Person educated: Patient Education method: Explanation Education comprehension: verbalized understanding  HOME EXERCISE PROGRAM: Access Code: [email or web address] URL: [email or web address] Date: 05/18/2023 Prepared by: Donavon Fudge  Exercises - Supine Quad Set  - 1 x daily - 7 x weekly - 2 sets - 10 reps - Supine Heel Slide with Strap  - 1 x daily - 7 x weekly - 2 sets - 10 reps - Sit to Stand  - 1 x daily - 7 x weekly - 2 sets - 10 reps - Seated Long Arc Quad  - 1 x daily - 7 x weekly - 2 sets - 10 reps  ASSESSMENT:  CLINICAL IMPRESSION: Pt. ambulated in with slightly antalgic gait. Still has limitations with knee flexion. We focused more on functional tasks today such as steps and STS. He was unable to do anterior heel taps on 6" and reports lateral taps are a little painful. Tried to work on some machines today for strengthening and isolated RLE. Requested to do vaso again as he states  it helped with the swelling. Continue to push for flexion ROM and eccentric quad strength and hamstring strengthening.    Patient is a 58 y.o. male who was seen today for physical therapy evaluation and treatment for R knee pain s/p mensicus surgery on 4/1. His biggest limitation is knee flexion, he is also missing some TKE. Patient reports low pain levels. He is walking with antalgic gait, no AD and has an ace wrap bandage around his incision. Has a follow up with his doctor 05/21/23. Patient will benefit from skilled PT to address his R knee deficits to return to PLOF.   OBJECTIVE IMPAIRMENTS: Abnormal gait, decreased mobility, difficulty walking, decreased ROM, decreased strength, and pain.   ACTIVITY LIMITATIONS: squatting, stairs, transfers, and locomotion level  PARTICIPATION LIMITATIONS: shopping, community activity, occupation, and yard work  PERSONAL FACTORS: Age and Fitness are also affecting patient's functional outcome.   REHAB POTENTIAL: Good  CLINICAL DECISION MAKING: Stable/uncomplicated  EVALUATION COMPLEXITY: Low  GOALS: Goals reviewed with patient? Yes   SHORT TERM GOALS: Target date: 06/29/23  Independent with initial HEP. Baseline: given 05/18/23 Goal status: INITIAL   2. Patient will demonstrate improved functional LE strength by completing 5x STS in <15 seconds. Baseline: 19.54s from elevated mat  Goal status: INITIAL   LONG TERM GOALS: Target date: 08/10/23  Independent with advanced/ongoing HEP to improve outcomes and carryover.  Baseline:  Goal  status: INITIAL  2.  KEALII CHINEN will demonstrate R knee flexion to 120 deg to ascend/descend stairs. Baseline: 86 w/pain Goal status: INITIAL  3.  Marijean Shouts Shouse will demonstrate full R knee extension for safety with gait. Baseline: 15 Goal status: INITIAL  4.  YASIN CURATOLA will be able to ambulate at least 600' safely with LRAD and normal gait pattern to access community.  Baseline: antalgic  gait Goal status: INITIAL  5.  KHYRON HOLLENBERG will be able to put his socks on and off.  Baseline: unable to do Goal status: INITIAL    PLAN:  PT FREQUENCY: 2x/week  PT DURATION: 12 weeks  PLANNED INTERVENTIONS: 97110-Therapeutic exercises, 97530- Therapeutic activity, 97112- Neuromuscular re-education, 97535- Self Care, [id number]- Manual therapy, [phone]- Gait training, [phone]- Vasopneumatic device, Patient/Family education, Balance training, Stair training, Taping, Dry Needling, Joint mobilization, and Cryotherapy  PLAN FOR NEXT SESSION: R knee strengthening, emphasize R knee flexion PROM, functional tasks- steps, STS, gait    Donavon Fudge, PT 06/15/2023, 5:44 PM [facility] at [facility]. [place], [place], [address] Phone: [phone]   Fax:  [phone]

## 2023-06-17 ENCOUNTER — Encounter: Payer: Self-pay | Admitting: Adult Health

## 2023-06-17 ENCOUNTER — Telehealth: Admitting: Adult Health

## 2023-06-17 DIAGNOSIS — F331 Major depressive disorder, recurrent, moderate: Secondary | ICD-10-CM

## 2023-06-17 DIAGNOSIS — F411 Generalized anxiety disorder: Secondary | ICD-10-CM

## 2023-06-17 DIAGNOSIS — G4700 Insomnia, unspecified: Secondary | ICD-10-CM | POA: Diagnosis not present

## 2023-06-17 DIAGNOSIS — F429 Obsessive-compulsive disorder, unspecified: Secondary | ICD-10-CM

## 2023-06-17 MED ORDER — DOXEPIN HCL 10 MG PO CAPS: 10.0000 mg | ORAL_CAPSULE | Freq: Every day | ORAL | 2 refills | Status: DC

## 2023-06-17 MED ORDER — PRAMIPEXOLE DIHYDROCHLORIDE 1.5 MG PO TABS: 1.5000 mg | ORAL_TABLET | Freq: Every day | ORAL | 2 refills | Status: DC

## 2023-06-17 MED ORDER — BUSPIRONE HCL 10 MG PO TABS: 20.0000 mg | ORAL_TABLET | Freq: Two times a day (BID) | ORAL | 1 refills | Status: DC

## 2023-06-17 MED ORDER — ALPRAZOLAM 0.5 MG PO TABS: 0.5000 mg | ORAL_TABLET | Freq: Every evening | ORAL | 2 refills | Status: DC | PRN

## 2023-06-17 MED ORDER — TRINTELLIX 20 MG PO TABS: 20.0000 mg | ORAL_TABLET | Freq: Every day | ORAL | 2 refills | Status: DC

## 2023-06-17 NOTE — Progress Notes (Signed)
 Donald Payne [id number] 07/28/1965 58 y.o.  Virtual Visit via Video Note  I connected with pt @ on 06/17/23 at  4:30 PM EDT by a video enabled telemedicine application and verified that I am speaking with the correct person using two identifiers.   I discussed the limitations of evaluation and management by telemedicine and the availability of in person appointments. The patient expressed understanding and agreed to proceed.  I discussed the assessment and treatment plan with the patient. The patient was provided an opportunity to ask questions and all were answered. The patient agreed with the plan and demonstrated an understanding of the instructions.   The patient was advised to call back or seek an in-person evaluation if the symptoms worsen or if the condition fails to improve as anticipated.  I provided 25 minutes of non-face-to-face time during this encounter.  The patient was located at home.  The provider was located at [facility].   Reagan Camera, NP   Subjective:   Patient ID:  Donald Payne is a 58 y.o. (DOB 11-Feb-1966) male.  Chief Complaint: No chief complaint on file.   HPI Donald Payne presents for follow-up of MDD, GAD, OCD, and insomnia.  Previously seen at the [facility].   Describes mood today as "ok". Pleasant. Denies tearfulness. Mood symptoms - reports situational depression. Reports stable interest and motivation. Reports anxiety - "comes and goes". Reports irritability at times. Denies recent panic attacks. Reports some worry, rumination and over thinking. Reports some situational stressors. Reports mood as stable. Stating "overall, I feel like I'm doing ok". Taking medications as prescribed.  Energy levels lower - "not the best". Active, does not have a regular exercise routine.  Enjoys some usual interests and activities. Married. Lives with wife. Wife is a traveling Engineer, civil (consulting). No children - stepchildren. Spending time with  family. Appetite adequate. Weight 245 to 250 pounds. Sleeps well most nights. Averages 7 to 8 hours. Focus and concentration stable. Completing tasks. Managing aspects of household. Retired - [employer].  Denies SI or HI.  Denies AH or VH. Denies self harm.  Denies substance use.  Previous medication trials: Ambien, Lithium, Caplyta and others.  Review of Systems:  Review of Systems  Musculoskeletal:  Negative for gait problem.  Neurological:  Negative for tremors.  Psychiatric/Behavioral:         Please refer to HPI    Medications: I have reviewed the patient's current medications.  Current Outpatient Medications  Medication Sig Dispense Refill   albuterol  (PROVENTIL ) (2.5 MG/3ML) 0.083% nebulizer solution Take 2.5 mg by nebulization 4 (four) times daily as needed for wheezing or shortness of breath.     albuterol  (VENTOLIN  HFA) 108 (90 Base) MCG/ACT inhaler Can inhale two puffs every four to six hours as needed for cough or wheeze. 18 g 0   ALPRAZolam  (XANAX ) 0.5 MG tablet Take 1 tablet (0.5 mg total) by mouth at bedtime as needed for anxiety. 30 tablet 2   aspirin  EC 325 MG tablet Take 1 tablet (325 mg total) by mouth daily. 30 tablet 0   azelastine  (ASTELIN ) 0.1 % nasal spray Can use one to two sprays in each nostril one to two times daily as directed. (Patient not taking: Reported on 05/06/2023) 30 mL 5   busPIRone  (BUSPAR ) 10 MG tablet Take 2 tablets (20 mg total) by mouth 2 (two) times daily. TAKE 2 TABLETS BY MOUTH 2 TIMES DAILY. 360 tablet 1   celecoxib  (CELEBREX ) 200 MG capsule  TAKE 1 CAPSULE (200 MG TOTAL) BY MOUTH 2 (TWO) TIMES DAILY BETWEEN MEALS AS NEEDED. (Patient not taking: Reported on 05/06/2023) 60 capsule 1   doxepin  (SINEQUAN ) 10 MG capsule Take 1 capsule (10 mg total) by mouth at bedtime. 30 capsule 2   Erenumab -aooe (AIMOVIG ) 70 MG/ML SOAJ INJECT 70 MG INTO THE SKIN EVERY 30 DAYS (Patient not taking: Reported on 05/06/2023) 1 mL 1   famotidine  (PEPCID ) 40 MG  tablet TAKE ONE TABLET ONCE EACH EVENING 30 tablet 0   fenofibrate micronized (ANTARA) 43 MG capsule Take 43 mg by mouth daily.     gabapentin  (NEURONTIN ) 300 MG capsule TAKE 2 CAPSULES BY MOUTH 2 TIMES DAILY. 120 capsule 3   HUMALOG 100 UNIT/ML injection Inject 0-200 Units into the skin See admin instructions. Used with Omnipod, fill with 200 units every 3 days     HYDROcodone -acetaminophen  (NORCO) 7.5-325 MG tablet Take 1 tablet by mouth every 6 (six) hours as needed for moderate pain. (Patient not taking: Reported on 05/06/2023) 30 tablet 0   JARDIANCE 25 MG TABS tablet Take 25 mg by mouth daily.     L-Methylfolate 15 MG TABS Take 15 mg by mouth daily.     lisinopril-hydrochlorothiazide (ZESTORETIC) 20-12.5 MG tablet Take 1 tablet by mouth daily.     MOUNJARO 10 MG/0.5ML Pen Inject 10 mg into the skin once a week.     oxyCODONE  (ROXICODONE ) 5 MG immediate release tablet Take 1 tablet (5 mg total) by mouth every 4 (four) hours as needed for severe pain (pain score 7-10) or breakthrough pain. 10 tablet 0   pantoprazole  (PROTONIX ) 40 MG tablet TAKE TWO TABLETS EVERY MORNING AS DIRECTED. 60 tablet 0   pramipexole  (MIRAPEX ) 1.5 MG tablet Take 1 tablet (1.5 mg total) by mouth daily. 45 tablet 2   predniSONE  (DELTASONE ) 20 MG tablet Take 1 tablet (20 mg total) by mouth daily with breakfast. (Patient not taking: Reported on 05/06/2023) 6 tablet 0   rizatriptan (MAXALT) 10 MG tablet Take 10 mg by mouth as needed for migraine.     simvastatin (ZOCOR) 40 MG tablet Take 40 mg by mouth every evening.     SUMAtriptan  (IMITREX ) 100 MG tablet Take 1 tablet (100 mg total) by mouth every 2 (two) hours as needed for migraine. May repeat in 2 hours if headache persists or recurs. (Patient not taking: Reported on 05/06/2023) 10 tablet 2   SYMBICORT  160-4.5 MCG/ACT inhaler INHALE TWO PUFFS TWICE DAILY TO PREVENT COUGH OR WHEEZE. RINSE MOUTH AFTER USE. (Patient not taking: Reported on 05/06/2023) 10.2 each 0   TRINTELLIX  20 MG TABS tablet Take 1 tablet (20 mg total) by mouth daily. 30 tablet 2   VITAMIN D PO Take 5,000 Units by mouth daily.     No current facility-administered medications for this visit.    Medication Side Effects: None  Allergies:  Allergies  Allergen Reactions   Atorvastatin Other (See Comments)    Muscle soreness  Muscle soreness  Muscle soreness  Muscle soreness, Muscle soreness   Budesonide-Formoterol Fumarate Other (See Comments)    Tongue/throat soreness   Metformin Hcl Diarrhea    Other reaction(s): Diarhea    Past Medical History:  Diagnosis Date   Allergic rhinitis    Allergy    Anxiety    Asthma    Depression    Diabetes mellitus (HCC)    Dyslipidemia    Fatigue    GERD (gastroesophageal reflux disease)    Headache  Hemorrhoids    Hyperlipidemia    Hypertension    Hypoxia    IBS (irritable bowel syndrome)    Low testosterone    Prostatitis    Sleep apnea    uses CPAP   Sleep apnea with hypersomnolence    Vitamin D deficiency     Family History  Problem Relation Age of Onset   Migraines Mother    Colon cancer Mother    Hypertension Mother    Diabetes Mother    Heart disease Father    Diabetes Paternal Grandmother    Heart disease Paternal Grandmother    Prostate cancer Paternal Grandfather    Healthy Other    Coronary artery disease Other        Deceased age 56   Esophageal cancer Neg Hx    Rectal cancer Neg Hx    Stomach cancer Neg Hx     Social History   Socioeconomic History   Marital status: Married    Spouse name: [name]   Number of children: 0   Years of education: Not on file   Highest education level: Master's degree (e.g., MA, MS, MEng, MEd, MSW, MBA)  Occupational History   Occupation: Field seismologist  Tobacco Use   Smoking status: Never   Smokeless tobacco: Never  Vaping Use   Vaping status: Never Used  Substance and Sexual Activity   Alcohol use: Not Currently   Drug use: No   Sexual activity: Not on file   Other Topics Concern   Not on file  Social History Narrative   Lives with wife   Caffeine- 3-4 diet sodas a day   Social Drivers of Corporate investment banker Strain: Not on file  Food Insecurity: Not on file  Transportation Needs: Not on file  Physical Activity: Not on file  Stress: Not on file  Social Connections: Not on file  Intimate Partner Violence: Not on file    Past Medical History, Surgical history, Social history, and Family history were reviewed and updated as appropriate.   Please see review of systems for further details on the patient's review from today.   Objective:   Physical Exam:  There were no vitals taken for this visit.  Physical Exam Constitutional:      General: He is not in acute distress. Musculoskeletal:        General: No deformity.  Neurological:     Mental Status: He is alert and oriented to person, place, and time.     Coordination: Coordination normal.  Psychiatric:        Attention and Perception: Attention and perception normal. He does not perceive auditory or visual hallucinations.        Mood and Affect: Mood normal. Mood is not anxious or depressed. Affect is not labile, blunt, angry or inappropriate.        Speech: Speech normal.        Behavior: Behavior normal.        Thought Content: Thought content normal. Thought content is not paranoid or delusional. Thought content does not include homicidal or suicidal ideation. Thought content does not include homicidal or suicidal plan.        Cognition and Memory: Cognition and memory normal.        Judgment: Judgment normal.     Comments: Insight intact     Lab Review:     Component Value Date/Time   NA 140 05/11/2023 1400   K 3.9 05/11/2023 1400   CL 102  05/11/2023 1400   CO2 28 05/11/2023 1400   GLUCOSE 122 (H) 05/11/2023 1400   BUN 25 (H) 05/11/2023 1400   CREATININE 1.33 (H) 05/11/2023 1400   CALCIUM 9.7 05/11/2023 1400   GFRNONAA >60 05/11/2023 1400        Component Value Date/Time   WBC 8.8 05/11/2023 1400   RBC 5.67 05/11/2023 1400   HGB 15.8 05/11/2023 1400   HGB 16.1 04/08/2018 0811   HCT 48.7 05/11/2023 1400   HCT 47.4 04/08/2018 0811   PLT 239 05/11/2023 1400   PLT 165 04/08/2018 0811   MCV 85.9 05/11/2023 1400   MCV 87 04/08/2018 0811   MCH 27.9 05/11/2023 1400   MCHC 32.4 05/11/2023 1400   RDW 14.0 05/11/2023 1400   RDW 14.2 04/08/2018 0811   LYMPHSABS 1.1 04/08/2018 0811   EOSABS 0.3 04/08/2018 0811   BASOSABS 0.1 04/08/2018 0811    No results found for: "POCLITH", "LITHIUM"   No results found for: "PHENYTOIN", "PHENOBARB", "VALPROATE", "CBMZ"   .res Assessment: Plan:    Treatment Plan/Recommendations:   Plan:  PDMP reviewed  Trintellix 20mg  daily Doxepin  10mg  at hs   Xanax  0.5mg  at hs for sleep - trying to taper off Buspar  20mg  BID  Mirapex  1.5mg  daily - plans to taper off  RTC 3 months  15 minutes spent dedicated to the care of this patient on the date of this encounter to include pre-visit review of records, ordering of medication, post visit documentation, and face-to-face time with the patient discussing MDD, GAD, OCD, and insomnia. Discussed continuing current medication regimen.  Patient advised to contact office with any questions, adverse effects, or acute worsening in signs and symptoms.   Discussed potential benefits, risk, and side effects of benzodiazepines to include potential risk of tolerance and dependence, as well as possible drowsiness.  Advised patient not to drive if experiencing drowsiness and to take lowest possible effective dose to minimize risk of dependence and tolerance.    Diagnoses and all orders for this visit:  Major depressive disorder, recurrent episode, moderate (HCC) -     TRINTELLIX 20 MG TABS tablet; Take 1 tablet (20 mg total) by mouth daily.  Generalized anxiety disorder -     ALPRAZolam  (XANAX ) 0.5 MG tablet; Take 1 tablet (0.5 mg total) by mouth at bedtime as  needed for anxiety. -     busPIRone  (BUSPAR ) 10 MG tablet; Take 2 tablets (20 mg total) by mouth 2 (two) times daily. TAKE 2 TABLETS BY MOUTH 2 TIMES DAILY. -     pramipexole  (MIRAPEX ) 1.5 MG tablet; Take 1 tablet (1.5 mg total) by mouth daily.  Obsessive-compulsive disorder, unspecified type -     busPIRone  (BUSPAR ) 10 MG tablet; Take 2 tablets (20 mg total) by mouth 2 (two) times daily. TAKE 2 TABLETS BY MOUTH 2 TIMES DAILY.  Insomnia, unspecified type  Other orders -     doxepin  (SINEQUAN ) 10 MG capsule; Take 1 capsule (10 mg total) by mouth at bedtime.     Please see After Visit Summary for patient specific instructions.  Future Appointments  Date Time Provider Department Center  06/18/2023  6:00 PM Dama Duffel Aurelia, [place] [facility]  06/19/2023  9:30 AM Wilhelmenia Harada, MD [facility]  07/02/2023  5:00 PM Payseur, Stan Eans, PTA [facility]  07/09/2023  5:15 PM Donavon Fudge, PT [facility]  07/14/2023  5:15 PM Donavon Fudge, PT [facility]  07/16/2023  5:15 PM Sajjad, Jefferey Minerva, PT [facility]    No  orders of the defined types were placed in this encounter.     -------------------------------

## 2023-06-18 ENCOUNTER — Ambulatory Visit

## 2023-06-18 DIAGNOSIS — Z9889 Other specified postprocedural states: Secondary | ICD-10-CM | POA: Diagnosis not present

## 2023-06-18 DIAGNOSIS — M25561 Pain in right knee: Secondary | ICD-10-CM

## 2023-06-18 DIAGNOSIS — M25661 Stiffness of right knee, not elsewhere classified: Secondary | ICD-10-CM

## 2023-06-18 NOTE — Therapy (Signed)
 OUTPATIENT PHYSICAL THERAPY LOWER EXTREMITY    Patient Name: Donald Payne MRN: [id number] DOB:[birth year], 58 y.o., male Today's Date: 06/18/2023  END OF SESSION:  PT End of Session - 06/18/23 1803     Visit Number 8    Date for PT Re-Evaluation 08/10/23    Authorization Type [employer]    PT Start Time 1802    PT Stop Time 1845    PT Time Calculation (min) 43 min                  Past Medical History:  Diagnosis Date   Allergic rhinitis    Allergy    Anxiety    Asthma    Depression    Diabetes mellitus (HCC)    Dyslipidemia    Fatigue    GERD (gastroesophageal reflux disease)    Headache    Hemorrhoids    Hyperlipidemia    Hypertension    Hypoxia    IBS (irritable bowel syndrome)    Low testosterone    Prostatitis    Sleep apnea    uses CPAP   Sleep apnea with hypersomnolence    Vitamin D deficiency    Past Surgical History:  Procedure Laterality Date   KNEE ARTHROSCOPY Right    KNEE ARTHROSCOPY WITH MENISCAL REPAIR Right 05/12/2023   Procedure: ARTHROSCOPY, KNEE, WITH MENISCUS REPAIR;  Surgeon: Wilhelmenia Harada, MD;  Location: [facility] OR;  Service: Orthopedics;  Laterality: Right;  RIGHT KNEE ARTHROSCOPY WITH MEDIAL MENISCUS REPAIR AND LYSIS OF ADHESIONS   WISDOM TOOTH EXTRACTION     Patient Active Problem List   Diagnosis Date Noted   Arthrofibrosis of knee joint, right 05/12/2023   Hypertensive heart disease 07/06/2018   Asthma 07/06/2018   Sleep apnea 07/06/2018   Morbid obesity (HCC) 07/06/2018   Dyspnea 10/27/2011    PCP: Rae Bugler  REFERRING PROVIDER: Wilhelmenia Harada  REFERRING DIAG:  [phone] (ICD-10-CM) - Complex tear of medial meniscus of right knee as current injury, subsequent encounter    THERAPY DIAG:  S/P lateral meniscus repair of right knee  Stiffness of right knee, not elsewhere classified  Acute pain of right knee  Rationale for Evaluation and Treatment: Rehabilitation  ONSET DATE: 05/12/23  SUBJECTIVE STATEMENT: Knee is  not too bad today, I walked a lot. It is getting better. I need to be able to get down and back up still.   PERTINENT HISTORY: R arthroscopy with medial meniscal repair 05/12/23 DM, depression, HTN, anxiety, obesity   PAIN:  Are you having pain? 0/10 before tx 3/10 after tx PRECAUTIONS: Knee  RED FLAGS: None   WEIGHT BEARING RESTRICTIONS: No  FALLS:  Has patient fallen in last 6 months? 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  DIAGNOSTIC FINDINGS: IMPRESSION: 1. Intrasubstance degeneration within junction of the body and posterior horn of the medial meniscus and within the body of the lateral meniscus. No tear is seen extending through an articular surface of either meniscus. 2. Mild-to-moderate mucoid degeneration of the ACL (ACL  cyst). No ACL tear is seen. 3. Mild tricompartmental cartilage degenerative changes. 4. Moderate joint effusion.   COGNITION: Overall cognitive status: Within functional limits for tasks assessed     SENSATION: WFL  EDEMA:  Swelling around R  MUSCLE LENGTH: Hamstrings: tightness in R hamstring  PALPATION: Pain with flexion PROM  LOWER EXTREMITY ROM:  Active ROM Right Eval Seated  Left eval AROM  Right  05/25/23  Hip flexion     Hip extension     Hip abduction     Hip adduction     Hip internal rotation     Hip external rotation     Knee flexion 86 w/pain  95  Knee extension 15   10  Ankle dorsiflexion     Ankle plantarflexion     Ankle inversion     Ankle  eversion      (Blank rows = not tested)  LOWER EXTREMITY MMT:  MMT Right eval Left eval  Hip flexion 5   Hip extension    Hip abduction    Hip adduction    Hip internal rotation    Hip external rotation    Knee flexion 4+   Knee extension 5   Ankle dorsiflexion    Ankle plantarflexion    Ankle inversion    Ankle eversion     (Blank rows = not tested)   FUNCTIONAL TESTS:  5 times sit to stand: 19.54s from elevated mat table  Timed up and go (TUG): 12.78s antalgic   GAIT: Distance walked: in clinic distances  Assistive device utilized: None Level of assistance: Modified independence Comments: antalgic gait, slowed speed, decreased knee flexion                                                                          TREATMENT DATE:  06/18/23 NuStep L5x41mins  Full revolutions on bike x3 mins with compensations PROM pushing into knee flexion and holding  RLE 5# LAQ hold 30s x3 Blue band HS curls 2x15 5# cable hip ext and abd  Leg press 40# 2x10  06/15/23 NuStep L5x59mins  Step ups 6" Lateral heel taps 6" Anterior heel taps 6"- stopped after 2 attempts STS 2x10 HS curls 35# 2x10, RLE 20# x10  Leg ext 10# 2x10, RLE 5# x10 Leg press 30# 2x10 RLE, ROM x10  Vaso high pressure 10 mins  06/10/23 PROM R knee flex/ext  NuStep L5x110mins Step ups forward and laterally Calf raises 2x12  Calf stretch 15s x2 Resisted gait 20# 3 way x5  Leg press 20# 2x10  Vaso high pressure  06/04/23 NuStep L5x49mins  Bike ROM partial revs x3 mins PROM to R knee flex/ext  LAQ 3# 2x10 HS curls green 2x10 STM with theragun to R low back  STS x10 Step overs 4" Anterior heel tap 4" Calf stretch 30s on slant  Vaso high pressure  06/01/23 Bike attempt- unable due to decreased ROM patellar mobs Rt knee ant/post mobs PROM Rt knee flex/ ext LAQ 3# 2x10- hold at TKE S2S 2x10 HS curl green band x10    Black bar heel raise 2x10 6in step up x10 each leg- cue to avoid  circumduction 4in step down rt leg on box  2x10 Vaso high pressure    05/28/22 NuStep L4x5mins LE only Calf stretch 30s  Calf raises 2x10  PROM to R knee end range holds, patellar mobs  LAQ 3# 2x10 HS curls green 2x10 STS elevated table 2x10 Step ups 4"  SLR 2# 2x10 SAQ 2# 2x10 Vaso high pressure 35 degrees  05/25/23 Nustep level 3 x 6 minutes LE only Bike partial revs x 4 minutes some partial revs able to do a few full revs Leg press 20# x10 both legs, no weight right only, then 20# right only with small ROM PROM some joint mobs CAlf stretches Resisted gait with tband fwd and backward On airex weight shift and march Practiced walking with cues for step length and a natural bend to the knee, tends to keep knee stiff and circumduct the leg Vaso medium pressure 35 degrees  05/21/23 Nustep L4 PROM with end range hold  Joint knee mobs ant & post QS x10 SLR x10 Seated knee ext x10 Sit to stand 2x10 Gait emphasize Hip flexion and heel strike 65ft PF stretch 6in step up x6, too much compensation with circumduction, switched to 4in x10 2in step up on airex pad 2x10    05/18/23- EVAL, HEP    PATIENT EDUCATION:  Education details: POC, HEP, icing and elevating Person educated: Patient Education method: Explanation Education comprehension: verbalized understanding  HOME EXERCISE PROGRAM: Access Code: [email or web address] URL: [email or web address] Date: 05/18/2023 Prepared by: Donavon Fudge  Exercises - Supine Quad Set  - 1 x daily - 7 x weekly - 2 sets - 10 reps - Supine Heel Slide with Strap  - 1 x daily - 7 x weekly - 2 sets - 10 reps - Sit to Stand  - 1 x daily - 7 x weekly - 2 sets - 10 reps - Seated Long Arc Quad  - 1 x daily - 7 x weekly - 2 sets - 10 reps  ASSESSMENT:  CLINICAL IMPRESSION: Pt. ambulated in with slightly antalgic gait. He feels the knee is slowly getting better but not where he wants it to be. He sees his doctor tomorrow. Pt still  has limitations with knee flexion. Continued to work on strengthening and ROM today.    Patient is a 58 y.o. male who was seen today for physical therapy evaluation and treatment for R knee pain s/p mensicus surgery on 4/1. His biggest limitation is knee flexion, he is also missing some TKE. Patient reports low pain levels. He is walking with antalgic gait, no AD and has an ace wrap bandage around his incision. Has a follow up with his doctor 05/21/23. Patient will benefit from skilled PT to address his R knee deficits to return to PLOF.   OBJECTIVE IMPAIRMENTS: Abnormal gait, decreased mobility, difficulty walking, decreased ROM, decreased strength, and pain.   ACTIVITY LIMITATIONS: squatting, stairs, transfers, and locomotion level  PARTICIPATION LIMITATIONS: shopping, community activity, occupation, and yard work  PERSONAL FACTORS: Age and Fitness are also affecting patient's functional outcome.   REHAB POTENTIAL: Good  CLINICAL DECISION MAKING: Stable/uncomplicated  EVALUATION COMPLEXITY: Low  GOALS: Goals reviewed with patient? Yes   SHORT TERM GOALS: Target date: 06/29/23  Independent with initial HEP. Baseline: given 05/18/23 Goal status: INITIAL   2. Patient will demonstrate improved functional LE strength by completing 5x STS in <15 seconds. Baseline: 19.54s from elevated mat  Goal status: INITIAL   LONG TERM GOALS: Target date: 08/10/23  Independent with advanced/ongoing HEP to improve outcomes and carryover.  Baseline:  Goal status: INITIAL  2.  JAYVIEN HAMMERS will demonstrate R knee flexion to 120 deg to ascend/descend stairs. Baseline: 86 w/pain Goal status: INITIAL  3.  Marijean Shouts Cafarelli will demonstrate full R knee extension for safety with gait. Baseline: 15 Goal status: INITIAL  4.  MARCELLO KACZKA will be able to ambulate at least 600' safely with LRAD and normal gait pattern to access community.  Baseline: antalgic gait Goal status: INITIAL  5.  LALO MONTON will be able to put his socks on and off.  Baseline: unable to do Goal status: INITIAL    PLAN:  PT FREQUENCY: 2x/week  PT DURATION: 12 weeks  PLANNED INTERVENTIONS: 97110-Therapeutic exercises, 97530- Therapeutic activity, 97112- Neuromuscular re-education, 97535- Self Care, 46962- Manual therapy, [id number]- Gait training, [id number]- Vasopneumatic device, Patient/Family education, Balance training, Stair training, Taping, Dry Needling, Joint mobilization, and Cryotherapy  PLAN FOR NEXT SESSION: R knee strengthening, emphasize R knee flexion PROM, functional tasks- steps, STS, gait    Donavon Fudge, PT 06/18/2023, 6:45 PM [facility] at [facility] [address] [facility] [place], [place], [address] Phone: [phone]   Fax:  [phone]

## 2023-06-19 ENCOUNTER — Ambulatory Visit (HOSPITAL_BASED_OUTPATIENT_CLINIC_OR_DEPARTMENT_OTHER): Admitting: Orthopaedic Surgery

## 2023-06-19 DIAGNOSIS — S83231D Complex tear of medial meniscus, current injury, right knee, subsequent encounter: Secondary | ICD-10-CM

## 2023-06-19 NOTE — Progress Notes (Signed)
 Post Operative Evaluation    Procedure/Date of Surgery: Right knee lysis of adhesions 4/1  Interval History:   Presents today 6-week status post right knee arthroscopic lysis of adhesions.  At this time he is working aggressively on range of motion.  He was able to get beyond 120 degrees.  He is experiencing some swelling and soreness today   PMH/PSH/Family History/Social History/Meds/Allergies:    Past Medical History:  Diagnosis Date   Allergic rhinitis    Allergy    Anxiety    Asthma    Depression    Diabetes mellitus (HCC)    Dyslipidemia    Fatigue    GERD (gastroesophageal reflux disease)    Headache    Hemorrhoids    Hyperlipidemia    Hypertension    Hypoxia    IBS (irritable bowel syndrome)    Low testosterone    Prostatitis    Sleep apnea    uses CPAP   Sleep apnea with hypersomnolence    Vitamin D deficiency    Past Surgical History:  Procedure Laterality Date   KNEE ARTHROSCOPY Right    KNEE ARTHROSCOPY WITH MENISCAL REPAIR Right 05/12/2023   Procedure: ARTHROSCOPY, KNEE, WITH MENISCUS REPAIR;  Surgeon: Wilhelmenia Harada, MD;  Location: [facility] OR;  Service: Orthopedics;  Laterality: Right;  RIGHT KNEE ARTHROSCOPY WITH MEDIAL MENISCUS REPAIR AND LYSIS OF ADHESIONS   WISDOM TOOTH EXTRACTION     Social History   Socioeconomic History   Marital status: Married    Spouse name: [name]   Number of children: 0   Years of education: Not on file   Highest education level: Master's degree (e.g., MA, MS, MEng, MEd, MSW, MBA)  Occupational History   Occupation: Field seismologist  Tobacco Use   Smoking status: Never   Smokeless tobacco: Never  Vaping Use   Vaping status: Never Used  Substance and Sexual Activity   Alcohol use: Not Currently   Drug use: No   Sexual activity: Not on file  Other Topics Concern   Not on file  Social History Narrative   Lives with wife   Caffeine- 3-4 diet sodas a day   Social Drivers of Manufacturing engineer Strain: Not on file  Food Insecurity: Not on file  Transportation Needs: Not on file  Physical Activity: Not on file  Stress: Not on file  Social Connections: Not on file   Family History  Problem Relation Age of Onset   Migraines Mother    Colon cancer Mother    Hypertension Mother    Diabetes Mother    Heart disease Father    Diabetes Paternal Grandmother    Heart disease Paternal Grandmother    Prostate cancer Paternal Grandfather    Healthy Other    Coronary artery disease Other        Deceased age 65   Esophageal cancer Neg Hx    Rectal cancer Neg Hx    Stomach cancer Neg Hx    Allergies  Allergen Reactions   Atorvastatin Other (See Comments)    Muscle soreness  Muscle soreness  Muscle soreness  Muscle soreness, Muscle soreness   Budesonide-Formoterol Fumarate Other (See Comments)    Tongue/throat soreness   Metformin Hcl Diarrhea    Other reaction(s): Diarhea   Current Outpatient Medications  Medication Sig  Dispense Refill   albuterol  (PROVENTIL ) (2.5 MG/3ML) 0.083% nebulizer solution Take 2.5 mg by nebulization 4 (four) times daily as needed for wheezing or shortness of breath.     albuterol  (VENTOLIN  HFA) 108 (90 Base) MCG/ACT inhaler Can inhale two puffs every four to six hours as needed for cough or wheeze. 18 g 0   ALPRAZolam  (XANAX ) 0.5 MG tablet Take 1 tablet (0.5 mg total) by mouth at bedtime as needed for anxiety. 30 tablet 2   aspirin  EC 325 MG tablet Take 1 tablet (325 mg total) by mouth daily. 30 tablet 0   azelastine  (ASTELIN ) 0.1 % nasal spray Can use one to two sprays in each nostril one to two times daily as directed. (Patient not taking: Reported on 05/06/2023) 30 mL 5   busPIRone  (BUSPAR ) 10 MG tablet Take 2 tablets (20 mg total) by mouth 2 (two) times daily. TAKE 2 TABLETS BY MOUTH 2 TIMES DAILY. 360 tablet 1   celecoxib  (CELEBREX ) 200 MG capsule TAKE 1 CAPSULE (200 MG TOTAL) BY MOUTH 2 (TWO) TIMES DAILY BETWEEN MEALS AS  NEEDED. (Patient not taking: Reported on 05/06/2023) 60 capsule 1   doxepin  (SINEQUAN ) 10 MG capsule Take 1 capsule (10 mg total) by mouth at bedtime. 30 capsule 2   Erenumab -aooe (AIMOVIG ) 70 MG/ML SOAJ INJECT 70 MG INTO THE SKIN EVERY 30 DAYS (Patient not taking: Reported on 05/06/2023) 1 mL 1   famotidine  (PEPCID ) 40 MG tablet TAKE ONE TABLET ONCE EACH EVENING 30 tablet 0   fenofibrate micronized (ANTARA) 43 MG capsule Take 43 mg by mouth daily.     gabapentin  (NEURONTIN ) 300 MG capsule TAKE 2 CAPSULES BY MOUTH 2 TIMES DAILY. 120 capsule 3   HUMALOG 100 UNIT/ML injection Inject 0-200 Units into the skin See admin instructions. Used with Omnipod, fill with 200 units every 3 days     HYDROcodone -acetaminophen  (NORCO) 7.5-325 MG tablet Take 1 tablet by mouth every 6 (six) hours as needed for moderate pain. (Patient not taking: Reported on 05/06/2023) 30 tablet 0   JARDIANCE 25 MG TABS tablet Take 25 mg by mouth daily.     L-Methylfolate 15 MG TABS Take 15 mg by mouth daily.     lisinopril-hydrochlorothiazide (ZESTORETIC) 20-12.5 MG tablet Take 1 tablet by mouth daily.     MOUNJARO 10 MG/0.5ML Pen Inject 10 mg into the skin once a week.     oxyCODONE  (ROXICODONE ) 5 MG immediate release tablet Take 1 tablet (5 mg total) by mouth every 4 (four) hours as needed for severe pain (pain score 7-10) or breakthrough pain. 10 tablet 0   pantoprazole  (PROTONIX ) 40 MG tablet TAKE TWO TABLETS EVERY MORNING AS DIRECTED. 60 tablet 0   pramipexole  (MIRAPEX ) 1.5 MG tablet Take 1 tablet (1.5 mg total) by mouth daily. 45 tablet 2   predniSONE  (DELTASONE ) 20 MG tablet Take 1 tablet (20 mg total) by mouth daily with breakfast. (Patient not taking: Reported on 05/06/2023) 6 tablet 0   rizatriptan (MAXALT) 10 MG tablet Take 10 mg by mouth as needed for migraine.     simvastatin (ZOCOR) 40 MG tablet Take 40 mg by mouth every evening.     SUMAtriptan  (IMITREX ) 100 MG tablet Take 1 tablet (100 mg total) by mouth every 2 (two)  hours as needed for migraine. May repeat in 2 hours if headache persists or recurs. (Patient not taking: Reported on 05/06/2023) 10 tablet 2   SYMBICORT  160-4.5 MCG/ACT inhaler INHALE TWO PUFFS TWICE DAILY TO PREVENT COUGH OR  WHEEZE. RINSE MOUTH AFTER USE. (Patient not taking: Reported on 05/06/2023) 10.2 each 0   TRINTELLIX  20 MG TABS tablet Take 1 tablet (20 mg total) by mouth daily. 30 tablet 2   VITAMIN D PO Take 5,000 Units by mouth daily.     No current facility-administered medications for this visit.   No results found.  Review of Systems:   A ROS was performed including pertinent positives and negatives as documented in the HPI.   Musculoskeletal Exam:    There were no vitals taken for this visit.  Right knee incisions are well-appearing without erythema or drainage.  Range of motion is from 0 to 100 degrees.  Distal neurosensory exam is intact  Imaging:      I personally reviewed and interpreted the radiographs.   Assessment:   6-week status post right knee arthroscopic lysis of adhesions doing well.  At this time he is overall continue to work on range of motion.  I have given him some advice on some squats that he can perform to get into deeper flexion.  I will plan to see him back in 4 weeks to check his progress.  I would like him to be 820 degrees but at time  Plan :    - Return to clinic 4 weeks for reassessment      I personally saw and evaluated the patient, and participated in the management and treatment plan.  Wilhelmenia Harada, MD Attending Physician, Orthopedic Surgery  This document was dictated using Dragon voice recognition software. A reasonable attempt at proof reading has been made to minimize errors.

## 2023-06-24 ENCOUNTER — Ambulatory Visit: Admitting: Physical Therapy

## 2023-06-24 ENCOUNTER — Encounter: Payer: Self-pay | Admitting: Physical Therapy

## 2023-06-24 DIAGNOSIS — Z9889 Other specified postprocedural states: Secondary | ICD-10-CM | POA: Diagnosis not present

## 2023-06-24 DIAGNOSIS — M25561 Pain in right knee: Secondary | ICD-10-CM

## 2023-06-24 DIAGNOSIS — M25661 Stiffness of right knee, not elsewhere classified: Secondary | ICD-10-CM

## 2023-06-24 NOTE — Therapy (Signed)
 OUTPATIENT PHYSICAL THERAPY LOWER EXTREMITY    Patient Name: Donald Payne MRN: [id number] DOB:06-14-1965, 58 y.o., male Today's Date: 06/24/2023  END OF SESSION:  PT End of Session - 06/24/23 1749     Visit Number 9    Date for PT Re-Evaluation 08/10/23    Authorization Type [employer]    PT Start Time 1745    PT Stop Time 1830    PT Time Calculation (min) 45 min    Activity Tolerance Patient tolerated treatment well    Behavior During Therapy WFL for tasks assessed/performed                  Past Medical History:  Diagnosis Date   Allergic rhinitis    Allergy    Anxiety    Asthma    Depression    Diabetes mellitus (HCC)    Dyslipidemia    Fatigue    GERD (gastroesophageal reflux disease)    Headache    Hemorrhoids    Hyperlipidemia    Hypertension    Hypoxia    IBS (irritable bowel syndrome)    Low testosterone    Prostatitis    Sleep apnea    uses CPAP   Sleep apnea with hypersomnolence    Vitamin D deficiency    Past Surgical History:  Procedure Laterality Date   KNEE ARTHROSCOPY Right    KNEE ARTHROSCOPY WITH MENISCAL REPAIR Right 05/12/2023   Procedure: ARTHROSCOPY, KNEE, WITH MENISCUS REPAIR;  Surgeon: Wilhelmenia Harada, MD;  Location: [facility] OR;  Service: Orthopedics;  Laterality: Right;  RIGHT KNEE ARTHROSCOPY WITH MEDIAL MENISCUS REPAIR AND LYSIS OF ADHESIONS   WISDOM TOOTH EXTRACTION     Patient Active Problem List   Diagnosis Date Noted   Arthrofibrosis of knee joint, right 05/12/2023   Hypertensive heart disease 07/06/2018   Asthma 07/06/2018   Sleep apnea 07/06/2018   Morbid obesity (HCC) 07/06/2018   Dyspnea 10/27/2011    PCP: Rae Bugler  REFERRING PROVIDER: Wilhelmenia Harada  REFERRING DIAG:  [phone] (ICD-10-CM) - Complex tear of medial meniscus of right knee as current injury, subsequent encounter    THERAPY DIAG:  S/P lateral meniscus repair of right knee  Stiffness of right knee, not elsewhere classified  Acute pain of right  knee  Rationale for Evaluation and Treatment: Rehabilitation  ONSET DATE: 05/12/23  SUBJECTIVE STATEMENT: Saw Dr. Hermina Loosen, he wants Donald Payne to get to 120 degrees of flexion in the next month.  PERTINENT HISTORY: R arthroscopy with medial meniscal repair 05/12/23 DM, depression, HTN, anxiety, obesity   PAIN:  Are you having pain? 0/10 before tx 3/10 after tx PRECAUTIONS: Knee  RED FLAGS: None   WEIGHT BEARING RESTRICTIONS: No  FALLS:  Has patient fallen in last 6 months? 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  DIAGNOSTIC FINDINGS: IMPRESSION: 1. Intrasubstance degeneration within junction of the body and posterior horn of the medial meniscus and within the body of the lateral meniscus. No tear is seen extending through an articular surface of either  meniscus. 2. Mild-to-moderate mucoid degeneration of the ACL (ACL cyst). No ACL tear is seen. 3. Mild tricompartmental cartilage degenerative changes. 4. Moderate joint effusion.   COGNITION: Overall cognitive status: Within functional limits for tasks assessed     SENSATION: WFL  EDEMA:  Swelling around R  MUSCLE LENGTH: Hamstrings: tightness in R hamstring  PALPATION: Pain with flexion PROM  LOWER EXTREMITY ROM:  Active ROM Right Eval Seated  Left eval AROM  Right  05/25/23  Hip flexion     Hip extension     Hip abduction     Hip adduction     Hip internal rotation     Hip external rotation     Knee flexion 86 w/pain  95  Knee  extension 15   10  Ankle dorsiflexion     Ankle plantarflexion     Ankle inversion     Ankle eversion      (Blank rows = not tested)  LOWER EXTREMITY MMT:  MMT Right eval Left eval  Hip flexion 5   Hip extension    Hip abduction    Hip adduction    Hip internal rotation    Hip external rotation    Knee flexion 4+   Knee extension 5   Ankle dorsiflexion    Ankle plantarflexion    Ankle inversion    Ankle eversion     (Blank rows = not tested)   FUNCTIONAL TESTS:  5 times sit to stand: 19.54s from elevated mat table  Timed up and go (TUG): 12.78s antalgic   GAIT: Distance walked: in clinic distances  Assistive device utilized: None Level of assistance: Modified independence Comments: antalgic gait, slowed speed, decreased knee flexion                                                                          TREATMENT DATE:  06/24/23 Nustep level 5 x 5 minutes Bike partial revs then full going backwards Leg press 20# working on getting more and more flexion then did without weight working on flexion 10# straight arm pulls on airex Worked on him kneeling on the right knee with a large bolster and then with 3 airex pads trying to go lower, using bar to pull up and some assist PROM into flexion, use of the Tgun anterior and posterior knee Vaso high pressure 34 degrees high pressure   06/18/23 NuStep L5x65mins  Full revolutions on bike x3 mins with compensations PROM pushing into knee flexion and holding  RLE 5# LAQ hold 30s x3 Blue band HS curls 2x15 5# cable hip ext and abd  Leg press 40# 2x10  06/15/23 NuStep L5x35mins  Step ups 6" Lateral heel taps 6" Anterior heel taps 6"- stopped after 2 attempts STS 2x10 HS curls 35# 2x10, RLE 20# x10  Leg ext 10# 2x10, RLE 5# x10 Leg press 30# 2x10 RLE, ROM x10  Vaso high pressure 10 mins  06/10/23 PROM R knee flex/ext  NuStep L5x82mins Step ups forward and laterally Calf raises 2x12  Calf stretch 15s x2 Resisted gait  20# 3 way x5  Leg press 20# 2x10  Vaso high pressure  06/04/23 NuStep L5x39mins  Bike ROM partial revs x3 mins PROM to  R knee flex/ext  LAQ 3# 2x10 HS curls green 2x10 STM with theragun to R low back  STS x10 Step overs 4" Anterior heel tap 4" Calf stretch 30s on slant  Vaso high pressure  06/01/23 Bike attempt- unable due to decreased ROM patellar mobs Rt knee ant/post mobs PROM Rt knee flex/ ext LAQ 3# 2x10- hold at TKE S2S 2x10 HS curl green band x10    Black bar heel raise 2x10 6in step up x10 each leg- cue to avoid circumduction 4in step down rt leg on box 2x10 Vaso high pressure    05/28/22 NuStep L4x79mins LE only Calf stretch 30s  Calf raises 2x10  PROM to R knee end range holds, patellar mobs  LAQ 3# 2x10 HS curls green 2x10 STS elevated table 2x10 Step ups 4"  SLR 2# 2x10 SAQ 2# 2x10 Vaso high pressure 35 degrees  05/25/23 Nustep level 3 x 6 minutes LE only Bike partial revs x 4 minutes some partial revs able to do a few full revs Leg press 20# x10 both legs, no weight right only, then 20# right only with small ROM PROM some joint mobs CAlf stretches Resisted gait with tband fwd and backward On airex weight shift and march Practiced walking with cues for step length and a natural bend to the knee, tends to keep knee stiff and circumduct the leg Vaso medium pressure 35 degrees  05/21/23 Nustep L4 PROM with end range hold  Joint knee mobs ant & post QS x10 SLR x10 Seated knee ext x10 Sit to stand 2x10 Gait emphasize Hip flexion and heel strike 75ft PF stretch 6in step up x6, too much compensation with circumduction, switched to 4in x10 2in step up on airex pad 2x10    05/18/23- EVAL, HEP    PATIENT EDUCATION:  Education details: POC, HEP, icing and elevating Person educated: Patient Education method: Explanation Education comprehension: verbalized understanding  HOME EXERCISE PROGRAM: Access Code: [email or web address] URL:  [email or web address] Date: 05/18/2023 Prepared by: Donald Payne  Exercises - Supine Quad Set  - 1 x daily - 7 x weekly - 2 sets - 10 reps - Supine Heel Slide with Strap  - 1 x daily - 7 x weekly - 2 sets - 10 reps - Sit to Stand  - 1 x daily - 7 x weekly - 2 sets - 10 reps - Seated Long Arc Quad  - 1 x daily - 7 x weekly - 2 sets - 10 reps  ASSESSMENT:  CLINICAL IMPRESSION: Pt. ambulated in with slightly antalgic gait. He saw Dr Hermina Loosen, the MD wants him to be at 120 degrees in 1 month.  He reports that he may have to qualify with his gun again and that means kneeling on the knee on the ground, we started trying some of this   Patient is a 58 y.o. male who was seen today for physical therapy evaluation and treatment for R knee pain s/p mensicus surgery on 4/1. His biggest limitation is knee flexion, he is also missing some TKE. Patient reports low pain levels. He is walking with antalgic gait, no AD and has an ace wrap bandage around his incision. Has a follow up with his doctor 05/21/23. Patient will benefit from skilled PT to address his R knee deficits to return to PLOF.   OBJECTIVE IMPAIRMENTS: Abnormal gait, decreased mobility, difficulty walking, decreased ROM, decreased strength, and pain.   ACTIVITY LIMITATIONS: squatting, stairs, transfers, and locomotion level  PARTICIPATION LIMITATIONS: shopping,  community activity, occupation, and yard work  PERSONAL FACTORS: Age and Fitness are also affecting patient's functional outcome.   REHAB POTENTIAL: Good  CLINICAL DECISION MAKING: Stable/uncomplicated  EVALUATION COMPLEXITY: Low  GOALS: Goals reviewed with patient? Yes   SHORT TERM GOALS: Target date: 06/29/23  Independent with initial HEP. Baseline: given 05/18/23 Goal status: met 06/24/23   2. Patient will demonstrate improved functional LE strength by completing 5x STS in <15 seconds. Baseline: 19.54s from elevated mat  Goal status: INITIAL   LONG TERM  GOALS: Target date: 08/10/23  Independent with advanced/ongoing HEP to improve outcomes and carryover.  Baseline:  Goal status: INITIAL  2.  Donald Payne will demonstrate R knee flexion to 120 deg to ascend/descend stairs. Baseline: 86 w/pain Goal status: INITIAL  3.  Donald Payne will demonstrate full R knee extension for safety with gait. Baseline: 15 Goal status: INITIAL  4.  Donald Payne will be able to ambulate at least 600' safely with LRAD and normal gait pattern to access community.  Baseline: antalgic gait Goal status: INITIAL  5.  Donald Payne will be able to put his socks on and off.  Baseline: unable to do Goal status: INITIAL    PLAN:  PT FREQUENCY: 2x/week  PT DURATION: 12 weeks  PLANNED INTERVENTIONS: 97110-Therapeutic exercises, 97530- Therapeutic activity, 97112- Neuromuscular re-education, 97535- Self Care, 16109- Manual therapy, [phone]- Gait training, [phone]- Vasopneumatic device, Patient/Family education, Balance training, Stair training, Taping, Dry Needling, Joint mobilization, and Cryotherapy  PLAN FOR NEXT SESSION: R knee strengthening, emphasize R knee flexion PROM, functional tasks- steps, STS, gait    Hollis Lurie, PT 06/24/2023, 5:53 PM  [facility] Outpatient Rehabilitation at [facility] [address] [facility]. [place], [place], [address] Phone: [phone]   Fax:  [phone]

## 2023-07-02 ENCOUNTER — Ambulatory Visit: Admitting: Physical Therapy

## 2023-07-02 DIAGNOSIS — Z9889 Other specified postprocedural states: Secondary | ICD-10-CM | POA: Diagnosis not present

## 2023-07-02 DIAGNOSIS — M25561 Pain in right knee: Secondary | ICD-10-CM

## 2023-07-02 DIAGNOSIS — M25661 Stiffness of right knee, not elsewhere classified: Secondary | ICD-10-CM

## 2023-07-02 NOTE — Therapy (Signed)
 OUTPATIENT PHYSICAL THERAPY LOWER EXTREMITY    Patient Name: Donald Payne MRN: [id number] DOB:[birth year], 58 y.o., male Today's Date: 07/02/2023  END OF SESSION:  PT End of Session - 07/02/23 1659     Visit Number 10    Date for PT Re-Evaluation 08/10/23    PT Start Time 1700    PT Stop Time 1745    PT Time Calculation (min) 45 min                  Past Medical History:  Diagnosis Date   Allergic rhinitis    Allergy    Anxiety    Asthma    Depression    Diabetes mellitus (HCC)    Dyslipidemia    Fatigue    GERD (gastroesophageal reflux disease)    Headache    Hemorrhoids    Hyperlipidemia    Hypertension    Hypoxia    IBS (irritable bowel syndrome)    Low testosterone    Prostatitis    Sleep apnea    uses CPAP   Sleep apnea with hypersomnolence    Vitamin D deficiency    Past Surgical History:  Procedure Laterality Date   KNEE ARTHROSCOPY Right    KNEE ARTHROSCOPY WITH MENISCAL REPAIR Right 05/12/2023   Procedure: ARTHROSCOPY, KNEE, WITH MENISCUS REPAIR;  Surgeon: Wilhelmenia Harada, MD;  Location: [facility] OR;  Service: Orthopedics;  Laterality: Right;  RIGHT KNEE ARTHROSCOPY WITH MEDIAL MENISCUS REPAIR AND LYSIS OF ADHESIONS   WISDOM TOOTH EXTRACTION     Patient Active Problem List   Diagnosis Date Noted   Arthrofibrosis of knee joint, right 05/12/2023   Hypertensive heart disease 07/06/2018   Asthma 07/06/2018   Sleep apnea 07/06/2018   Morbid obesity (HCC) 07/06/2018   Dyspnea 10/27/2011    PCP: Rae Bugler  REFERRING PROVIDER: Wilhelmenia Harada  REFERRING DIAG:  [phone] (ICD-10-CM) - Complex tear of medial meniscus of right knee as current injury, subsequent encounter    THERAPY DIAG:  S/P lateral meniscus repair of right knee  Stiffness of right knee, not elsewhere classified  Acute pain of right knee  Rationale for Evaluation and Treatment: Rehabilitation  ONSET DATE: 05/12/23  SUBJECTIVE STATEMENT:  went to gym today- knee wt  machine, bike,rowing machine for ROM so I am sore    Saw Dr. Hermina Loosen, he wants Landon Pinion to get to 120 degrees of flexion in the next month.  PERTINENT HISTORY: R arthroscopy with medial meniscal repair 05/12/23 DM, depression, HTN, anxiety, obesity   PAIN:  Are you having pain? 0/10 before tx 3/10 after tx PRECAUTIONS: Knee  RED FLAGS: None   WEIGHT BEARING RESTRICTIONS: No  FALLS:  Has patient fallen in last 6 months? 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  DIAGNOSTIC FINDINGS: IMPRESSION: 1. Intrasubstance degeneration within junction of the body and posterior horn of the medial meniscus and within the body of the lateral meniscus. No tear is seen extending through an articular surface of either meniscus. 2. Mild-to-moderate mucoid degeneration of  the ACL (ACL cyst). No ACL tear is seen. 3. Mild tricompartmental cartilage degenerative changes. 4. Moderate joint effusion.   COGNITION: Overall cognitive status: Within functional limits for tasks assessed     SENSATION: WFL  EDEMA:  Swelling around R  MUSCLE LENGTH: Hamstrings: tightness in R hamstring  PALPATION: Pain with flexion PROM  LOWER EXTREMITY ROM:  Active ROM Right Eval Seated  Left eval AROM  Right  05/25/23  Hip flexion     Hip extension     Hip abduction     Hip adduction     Hip internal rotation     Hip external rotation     Knee flexion 86 w/pain  95  Knee extension 15   10  Ankle dorsiflexion     Ankle  plantarflexion     Ankle inversion     Ankle eversion      (Blank rows = not tested)  LOWER EXTREMITY MMT:  MMT Right eval Left eval  Hip flexion 5   Hip extension    Hip abduction    Hip adduction    Hip internal rotation    Hip external rotation    Knee flexion 4+   Knee extension 5   Ankle dorsiflexion    Ankle plantarflexion    Ankle inversion    Ankle eversion     (Blank rows = not tested)   FUNCTIONAL TESTS:  5 times sit to stand: 19.54s from elevated mat table  Timed up and go (TUG): 12.78s antalgic   GAIT: Distance walked: in clinic distances  Assistive device utilized: None Level of assistance: Modified independence Comments: antalgic gait, slowed speed, decreased knee flexion                                                                          TREATMENT DATE:   07/02/23  Elliptcal 2 min fwd/ 2 min backward STS 20 inches 10 x Holding TM and kneeling down to ariex pad on TM 10 x each leg- heavy UE use even with cuing Kneeling on airex on mat with wt shifts and coming up tall 10 x Belt mobs to increase Flexion Passive flexion stretching ROM after   Act  103       Pass   109 Green tband HS curl 2 sets 10 4 inch step down 10 x the 6 in 10 x- with UE support Leg press 20# working on getting more and more flexion then did without weight working VASO High Pressure     06/24/23 Nustep level 5 x 5 minutes Bike partial revs then full going backwards Leg press 20# working on getting more and more flexion then did without weight working on flexion 10# straight arm pulls on airex Worked on him kneeling on the right knee with a large bolster and then with 3 airex pads trying to go lower, using bar to pull up and some assist PROM into flexion, use of the Tgun anterior and posterior knee Vaso high pressure 34 degrees high pressure   06/18/23 NuStep L5x4mins  Full revolutions on bike x3 mins with compensations PROM pushing into knee flexion and holding   RLE 5# LAQ hold 30s x3 Blue band HS curls 2x15 5#  cable hip ext and abd  Leg press 40# 2x10  06/15/23 NuStep L5x10mins  Step ups 6" Lateral heel taps 6" Anterior heel taps 6"- stopped after 2 attempts STS 2x10 HS curls 35# 2x10, RLE 20# x10  Leg ext 10# 2x10, RLE 5# x10 Leg press 30# 2x10 RLE, ROM x10  Vaso high pressure 10 mins  06/10/23 PROM R knee flex/ext  NuStep L5x25mins Step ups forward and laterally Calf raises 2x12  Calf stretch 15s x2 Resisted gait 20# 3 way x5  Leg press 20# 2x10  Vaso high pressure  06/04/23 NuStep L5x69mins  Bike ROM partial revs x3 mins PROM to R knee flex/ext  LAQ 3# 2x10 HS curls green 2x10 STM with theragun to R low back  STS x10 Step overs 4" Anterior heel tap 4" Calf stretch 30s on slant  Vaso high pressure  06/01/23 Bike attempt- unable due to decreased ROM patellar mobs Rt knee ant/post mobs PROM Rt knee flex/ ext LAQ 3# 2x10- hold at TKE S2S 2x10 HS curl green band x10    Black bar heel raise 2x10 6in step up x10 each leg- cue to avoid circumduction 4in step down rt leg on box 2x10 Vaso high pressure    05/28/22 NuStep L4x75mins LE only Calf stretch 30s  Calf raises 2x10  PROM to R knee end range holds, patellar mobs  LAQ 3# 2x10 HS curls green 2x10 STS elevated table 2x10 Step ups 4"  SLR 2# 2x10 SAQ 2# 2x10 Vaso high pressure 35 degrees  05/25/23 Nustep level 3 x 6 minutes LE only Bike partial revs x 4 minutes some partial revs able to do a few full revs Leg press 20# x10 both legs, no weight right only, then 20# right only with small ROM PROM some joint mobs CAlf stretches Resisted gait with tband fwd and backward On airex weight shift and march Practiced walking with cues for step length and a natural bend to the knee, tends to keep knee stiff and circumduct the leg Vaso medium pressure 35 degrees  05/21/23 Nustep L4 PROM with end range hold  Joint knee mobs ant & post QS x10 SLR  x10 Seated knee ext x10 Sit to stand 2x10 Gait emphasize Hip flexion and heel strike 77ft PF stretch 6in step up x6, too much compensation with circumduction, switched to 4in x10 2in step up on airex pad 2x10    05/18/23- EVAL, HEP    PATIENT EDUCATION:  Education details: POC, HEP, icing and elevating Person educated: Patient Education method: Explanation Education comprehension: verbalized understanding  HOME EXERCISE PROGRAM: Access Code: [email or web address] URL: [email or web address] Date: 05/18/2023 Prepared by: Donavon Fudge  Exercises - Supine Quad Set  - 1 x daily - 7 x weekly - 2 sets - 10 reps - Supine Heel Slide with Strap  - 1 x daily - 7 x weekly - 2 sets - 10 reps - Sit to Stand  - 1 x daily - 7 x weekly - 2 sets - 10 reps - Seated Long Arc Quad  - 1 x daily - 7 x weekly - 2 sets - 10 reps  ASSESSMENT:  CLINICAL IMPRESSION: Pt. ambulated in with slightly antalgic gait. He saw Dr Hermina Loosen, the MD wants him to be at 120 degrees in 1 month.  He reports that he may have to qualify with his gun again and that means kneeling on the knee on the ground, we continued working on this ,he tolerate fair ,cuing needed  and use of UE.Responded well to belt mobs to increase flexion as documented above.Goals assessed and documented.   Patient is a 58 y.o. male who was seen today for physical therapy evaluation and treatment for R knee pain s/p mensicus surgery on 4/1. His biggest limitation is knee flexion, he is also missing some TKE. Patient reports low pain levels. He is walking with antalgic gait, no AD and has an ace wrap bandage around his incision. Has a follow up with his doctor 05/21/23. Patient will benefit from skilled PT to address his R knee deficits to return to PLOF.   OBJECTIVE IMPAIRMENTS: Abnormal gait, decreased mobility, difficulty walking, decreased ROM, decreased strength, and pain.   ACTIVITY LIMITATIONS: squatting, stairs, transfers, and locomotion  level  PARTICIPATION LIMITATIONS: shopping, community activity, occupation, and yard work  PERSONAL FACTORS: Age and Fitness are also affecting patient's functional outcome.   REHAB POTENTIAL: Good  CLINICAL DECISION MAKING: Stable/uncomplicated  EVALUATION COMPLEXITY: Low  GOALS: Goals reviewed with patient? Yes   SHORT TERM GOALS: Target date: 06/29/23  Independent with initial HEP. Baseline: given 05/18/23 Goal status: met 06/24/23   2. Patient will demonstrate improved functional LE strength by completing 5x STS in <15 seconds. Baseline: 19.54s from elevated mat  Goal status:07/02/23   LONG TERM GOALS: Target date: 08/10/23  Independent with advanced/ongoing HEP to improve outcomes and carryover.  Baseline:  Goal status: evolving 07/02/23  2.  DAMIN SALIDO will demonstrate R knee flexion to 120 deg to ascend/descend stairs. Baseline: 86 w/pain Goal status: 07/02/23 progressing  3.  JYMIR DUNAJ will demonstrate full R knee extension for safety with gait. Baseline: 15 Goal status: INITIAL  4.  MAICOL BOWLAND will be able to ambulate at least 600' safely with LRAD and normal gait pattern to access community.  Baseline: antalgic gait Goal status: INITIAL  5.  JOHNHENRY TIPPIN will be able to put his socks on and off.  Baseline: unable to do Goal status: INITIAL    PLAN:  PT FREQUENCY: 2x/week  PT DURATION: 12 weeks  PLANNED INTERVENTIONS: 97110-Therapeutic exercises, 97530- Therapeutic activity, 97112- Neuromuscular re-education, 97535- Self Care, 81191- Manual therapy, [phone]- Gait training, [phone]- Vasopneumatic device, Patient/Family education, Balance training, Stair training, Taping, Dry Needling, Joint mobilization, and Cryotherapy  PLAN FOR NEXT SESSION: R knee strengthening, emphasize R knee flexion PROM, functional tasks- steps, STS, gait    Regina Coppolino,ANGIE, PTA 07/02/2023, 5:00 PM [facility] Outpatient Rehabilitation at [facility]  [address] [facility]. [place], [place], [address] Phone: [phone]   Fax:  [phone]  Outpatient Rehabilitation at [facility] [address] [facility] [address]. [place], [place], [address] Phone: [phone]   Fax:  [phone]  Patient Details  Name: ABDURAHMAN RUGG MRN: [id number] Date of Birth: [birth year] Referring Provider:  Wilhelmenia Harada, MD

## 2023-07-08 NOTE — Therapy (Signed)
 OUTPATIENT PHYSICAL THERAPY LOWER EXTREMITY    Patient Name: Donald Payne MRN: [id number] DOB:[birth year], 58 y.o., male Today's Date: 07/09/2023  END OF SESSION:  PT End of Session - 07/09/23 1716     Visit Number 11    Date for PT Re-Evaluation 08/10/23    PT Start Time 1715    PT Stop Time 1800    PT Time Calculation (min) 45 min                   Past Medical History:  Diagnosis Date   Allergic rhinitis    Allergy    Anxiety    Asthma    Depression    Diabetes mellitus (HCC)    Dyslipidemia    Fatigue    GERD (gastroesophageal reflux disease)    Headache    Hemorrhoids    Hyperlipidemia    Hypertension    Hypoxia    IBS (irritable bowel syndrome)    Low testosterone    Prostatitis    Sleep apnea    uses CPAP   Sleep apnea with hypersomnolence    Vitamin D deficiency    Past Surgical History:  Procedure Laterality Date   KNEE ARTHROSCOPY Right    KNEE ARTHROSCOPY WITH MENISCAL REPAIR Right 05/12/2023   Procedure: ARTHROSCOPY, KNEE, WITH MENISCUS REPAIR;  Surgeon: Wilhelmenia Harada, MD;  Location: [facility] OR;  Service: Orthopedics;  Laterality: Right;  RIGHT KNEE ARTHROSCOPY WITH MEDIAL MENISCUS REPAIR AND LYSIS OF ADHESIONS   WISDOM TOOTH EXTRACTION     Patient Active Problem List   Diagnosis Date Noted   Arthrofibrosis of knee joint, right 05/12/2023   Hypertensive heart disease 07/06/2018   Asthma 07/06/2018   Sleep apnea 07/06/2018   Morbid obesity (HCC) 07/06/2018   Dyspnea 10/27/2011    PCP: Rae Bugler  REFERRING PROVIDER: Wilhelmenia Harada  REFERRING DIAG:  [phone] (ICD-10-CM) - Complex tear of medial meniscus of right knee as current injury, subsequent encounter    THERAPY DIAG:  S/P lateral meniscus repair of right knee  Stiffness of right knee, not elsewhere classified  Acute pain of right knee  Rationale for Evaluation and Treatment: Rehabilitation  ONSET DATE: 05/12/23  SUBJECTIVE STATEMENT:  knee feels stiff and tight.     PERTINENT HISTORY: R arthroscopy with medial meniscal repair 05/12/23 DM, depression, HTN, anxiety, obesity   PAIN:  Are you having pain? 0/10 before tx 3/10 after tx PRECAUTIONS: Knee  RED FLAGS: None   WEIGHT BEARING RESTRICTIONS: No  FALLS:  Has patient fallen in last 6 months? 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  DIAGNOSTIC FINDINGS: IMPRESSION: 1. Intrasubstance degeneration within junction of the body and posterior horn of the medial meniscus and within the body of the lateral meniscus. No tear is seen extending through an articular surface of either meniscus. 2. Mild-to-moderate mucoid degeneration of the ACL (ACL cyst). No ACL tear is seen. 3. Mild tricompartmental cartilage degenerative changes. 4. Moderate joint effusion.   COGNITION: Overall cognitive status: Within functional  limits for tasks assessed     SENSATION: WFL  EDEMA:  Swelling around R  MUSCLE LENGTH: Hamstrings: tightness in R hamstring  PALPATION: Pain with flexion PROM  LOWER EXTREMITY ROM:  Active ROM Right Eval Seated  Left eval AROM  Right  05/25/23  Hip flexion     Hip extension     Hip abduction     Hip adduction     Hip internal rotation     Hip external rotation     Knee flexion 86 w/pain  95  Knee extension 15   10  Ankle dorsiflexion     Ankle plantarflexion     Ankle inversion     Ankle eversion      (Blank rows = not tested)  LOWER EXTREMITY MMT:  MMT Right eval Left eval  Hip  flexion 5   Hip extension    Hip abduction    Hip adduction    Hip internal rotation    Hip external rotation    Knee flexion 4+   Knee extension 5   Ankle dorsiflexion    Ankle plantarflexion    Ankle inversion    Ankle eversion     (Blank rows = not tested)   FUNCTIONAL TESTS:  5 times sit to stand: 19.54s from elevated mat table  Timed up and go (TUG): 12.78s antalgic   GAIT: Distance walked: in clinic distances  Assistive device utilized: None Level of assistance: Modified independence Comments: antalgic gait, slowed speed, decreased knee flexion                                                                          TREATMENT DATE:  07/09/23 PROM to R knee Recheck goals Walk outdoors Bike L2 x64mins  Calf stretch Calf raises 2x10 Step ups 6"  Leg ext 15# 2x10  HS curls 35# 2x10  07/02/23  Elliptcal 2 min fwd/ 2 min backward STS 20 inches 10 x Holding TM and kneeling down to ariex pad on TM 10 x each leg- heavy UE use even with cuing Kneeling on airex on mat with wt shifts and coming up tall 10 x Belt mobs to increase Flexion Passive flexion stretching ROM after   Act  103       Pass   109 Green tband HS curl 2 sets 10 4 inch step down 10 x the 6 in 10 x- with UE support Leg press 20# working on getting more and more flexion then did without weight working VASO High Pressure     06/24/23 Nustep level 5 x 5 minutes Bike partial revs then full going backwards Leg press 20# working on getting more and more flexion then did without weight working on flexion 10# straight arm pulls on airex Worked on him kneeling on the right knee with a large bolster and then with 3 airex pads trying to go lower, using bar to pull up and some assist PROM into flexion, use of the Tgun anterior and posterior knee Vaso high pressure 34 degrees high pressure   06/18/23 NuStep L5x76mins  Full revolutions on bike x3 mins with compensations PROM pushing into knee flexion and  holding  RLE 5# LAQ hold 30s x3 Blue band  HS curls 2x15 5# cable hip ext and abd  Leg press 40# 2x10  06/15/23 NuStep L5x81mins  Step ups 6" Lateral heel taps 6" Anterior heel taps 6"- stopped after 2 attempts STS 2x10 HS curls 35# 2x10, RLE 20# x10  Leg ext 10# 2x10, RLE 5# x10 Leg press 30# 2x10 RLE, ROM x10  Vaso high pressure 10 mins  06/10/23 PROM R knee flex/ext  NuStep L5x24mins Step ups forward and laterally Calf raises 2x12  Calf stretch 15s x2 Resisted gait 20# 3 way x5  Leg press 20# 2x10  Vaso high pressure  06/04/23 NuStep L5x63mins  Bike ROM partial revs x3 mins PROM to R knee flex/ext  LAQ 3# 2x10 HS curls green 2x10 STM with theragun to R low back  STS x10 Step overs 4" Anterior heel tap 4" Calf stretch 30s on slant  Vaso high pressure  06/01/23 Bike attempt- unable due to decreased ROM patellar mobs Rt knee ant/post mobs PROM Rt knee flex/ ext LAQ 3# 2x10- hold at TKE S2S 2x10 HS curl green band x10    Black bar heel raise 2x10 6in step up x10 each leg- cue to avoid circumduction 4in step down rt leg on box 2x10 Vaso high pressure    05/28/22 NuStep L4x35mins LE only Calf stretch 30s  Calf raises 2x10  PROM to R knee end range holds, patellar mobs  LAQ 3# 2x10 HS curls green 2x10 STS elevated table 2x10 Step ups 4"  SLR 2# 2x10 SAQ 2# 2x10 Vaso high pressure 35 degrees  05/25/23 Nustep level 3 x 6 minutes LE only Bike partial revs x 4 minutes some partial revs able to do a few full revs Leg press 20# x10 both legs, no weight right only, then 20# right only with small ROM PROM some joint mobs CAlf stretches Resisted gait with tband fwd and backward On airex weight shift and march Practiced walking with cues for step length and a natural bend to the knee, tends to keep knee stiff and circumduct the leg Vaso medium pressure 35 degrees  05/21/23 Nustep L4 PROM with end range hold  Joint knee mobs ant & post QS  x10 SLR x10 Seated knee ext x10 Sit to stand 2x10 Gait emphasize Hip flexion and heel strike 63ft PF stretch 6in step up x6, too much compensation with circumduction, switched to 4in x10 2in step up on airex pad 2x10    05/18/23- EVAL, HEP    PATIENT EDUCATION:  Education details: POC, HEP, icing and elevating Person educated: Patient Education method: Explanation Education comprehension: verbalized understanding  HOME EXERCISE PROGRAM: Access Code: [email or web address] URL: [email or web address] Date: 05/18/2023 Prepared by: Donavon Fudge  Exercises - Supine Quad Set  - 1 x daily - 7 x weekly - 2 sets - 10 reps - Supine Heel Slide with Strap  - 1 x daily - 7 x weekly - 2 sets - 10 reps - Sit to Stand  - 1 x daily - 7 x weekly - 2 sets - 10 reps - Seated Long Arc Quad  - 1 x daily - 7 x weekly - 2 sets - 10 reps  ASSESSMENT:  CLINICAL IMPRESSION: Pt. ambulated in with slightly antalgic gait. He still has ROM limitations. We continued to work on pushing for range and strength. Encouraged him to really focus on ROM at home, showed him seated knee flexion with towel as an easy way to increase flexion mobility.    Patient is a  58 y.o. male who was seen today for physical therapy evaluation and treatment for R knee pain s/p mensicus surgery on 4/1. His biggest limitation is knee flexion, he is also missing some TKE. Patient reports low pain levels. He is walking with antalgic gait, no AD and has an ace wrap bandage around his incision. Has a follow up with his doctor 05/21/23. Patient will benefit from skilled PT to address his R knee deficits to return to PLOF.   OBJECTIVE IMPAIRMENTS: Abnormal gait, decreased mobility, difficulty walking, decreased ROM, decreased strength, and pain.   ACTIVITY LIMITATIONS: squatting, stairs, transfers, and locomotion level  PARTICIPATION LIMITATIONS: shopping, community activity, occupation, and yard work  PERSONAL FACTORS: Age and Fitness are  also affecting patient's functional outcome.   REHAB POTENTIAL: Good  CLINICAL DECISION MAKING: Stable/uncomplicated  EVALUATION COMPLEXITY: Low  GOALS: Goals reviewed with patient? Yes   SHORT TERM GOALS: Target date: 06/29/23  Independent with initial HEP. Baseline: given 05/18/23 Goal status: met 06/24/23   2. Patient will demonstrate improved functional LE strength by completing 5x STS in <15 seconds. Baseline: 19.54s from elevated mat  Goal status:07/02/23   LONG TERM GOALS: Target date: 08/10/23  Independent with advanced/ongoing HEP to improve outcomes and carryover.  Baseline:  Goal status: evolving 07/02/23  2.  Donald Payne will demonstrate R knee flexion to 120 deg to ascend/descend stairs. Baseline: 86 w/pain Goal status: 07/02/23 progressing, 100d 07/09/23  3.  Donald Payne will demonstrate full R knee extension for safety with gait. Baseline: 15 Goal status: IN PROGRESS 10d 07/09/23  4.  Donald Payne will be able to ambulate at least 600' safely with LRAD and normal gait pattern to access community.  Baseline: antalgic gait Goal status: IN PROGRESS  5.  Donald Payne will be able to put his socks on and off.  Baseline: unable to do Goal status: MET 07/09/23    PLAN:  PT FREQUENCY: 2x/week  PT DURATION: 12 weeks  PLANNED INTERVENTIONS: 97110-Therapeutic exercises, 97530- Therapeutic activity, 97112- Neuromuscular re-education, 97535- Self Care, 21308- Manual therapy, [phone]- Gait training, [phone]- Vasopneumatic device, Patient/Family education, Balance training, Stair training, Taping, Dry Needling, Joint mobilization, and Cryotherapy  PLAN FOR NEXT SESSION: R knee strengthening, emphasize R knee flexion PROM, functional tasks- steps, STS, gait    Donavon Fudge, PT 07/09/2023, 6:02 PM [facility] Out[facility] at [facility] [address] [facility]. [place], [place], [address] Phone: [phone]   Fax:  [phone]patient [facility] at [facility] [address] [place], [place], [address] Phone: [phone]   Fax:  [phone]  Patient Details  Name: Donald Payne MRN: [id number] Date of Birth: 1965-04-05 Referring Provider:  Wilhelmenia Harada, MD

## 2023-07-09 ENCOUNTER — Ambulatory Visit

## 2023-07-09 DIAGNOSIS — M25661 Stiffness of right knee, not elsewhere classified: Secondary | ICD-10-CM

## 2023-07-09 DIAGNOSIS — Z9889 Other specified postprocedural states: Secondary | ICD-10-CM

## 2023-07-09 DIAGNOSIS — M25561 Pain in right knee: Secondary | ICD-10-CM

## 2023-07-14 ENCOUNTER — Ambulatory Visit: Attending: Orthopaedic Surgery

## 2023-07-14 ENCOUNTER — Encounter: Payer: Self-pay | Admitting: Adult Health

## 2023-07-14 ENCOUNTER — Telehealth: Admitting: Adult Health

## 2023-07-14 DIAGNOSIS — F331 Major depressive disorder, recurrent, moderate: Secondary | ICD-10-CM | POA: Diagnosis not present

## 2023-07-14 DIAGNOSIS — F411 Generalized anxiety disorder: Secondary | ICD-10-CM

## 2023-07-14 DIAGNOSIS — M25561 Pain in right knee: Secondary | ICD-10-CM | POA: Diagnosis present

## 2023-07-14 DIAGNOSIS — F429 Obsessive-compulsive disorder, unspecified: Secondary | ICD-10-CM | POA: Diagnosis not present

## 2023-07-14 DIAGNOSIS — M25661 Stiffness of right knee, not elsewhere classified: Secondary | ICD-10-CM | POA: Insufficient documentation

## 2023-07-14 DIAGNOSIS — Z9889 Other specified postprocedural states: Secondary | ICD-10-CM | POA: Diagnosis present

## 2023-07-14 NOTE — Therapy (Signed)
 OUTPATIENT PHYSICAL THERAPY LOWER EXTREMITY    Patient Name: Donald Payne MRN: [id number] DOB:1965/06/07, 58 y.o., male Today's Date: 07/14/2023  END OF SESSION:  PT End of Session - 07/14/23 1720     Visit Number 12    Date for PT Re-Evaluation 08/10/23    PT Start Time 1719    PT Stop Time 1800    PT Time Calculation (min) 41 min                    Past Medical History:  Diagnosis Date   Allergic rhinitis    Allergy    Anxiety    Asthma    Depression    Diabetes mellitus (HCC)    Dyslipidemia    Fatigue    GERD (gastroesophageal reflux disease)    Headache    Hemorrhoids    Hyperlipidemia    Hypertension    Hypoxia    IBS (irritable bowel syndrome)    Low testosterone    Prostatitis    Sleep apnea    uses CPAP   Sleep apnea with hypersomnolence    Vitamin D deficiency    Past Surgical History:  Procedure Laterality Date   KNEE ARTHROSCOPY Right    KNEE ARTHROSCOPY WITH MENISCAL REPAIR Right 05/12/2023   Procedure: ARTHROSCOPY, KNEE, WITH MENISCUS REPAIR;  Surgeon: Wilhelmenia Harada, MD;  Location: [facility] OR;  Service: Orthopedics;  Laterality: Right;  RIGHT KNEE ARTHROSCOPY WITH MEDIAL MENISCUS REPAIR AND LYSIS OF ADHESIONS   WISDOM TOOTH EXTRACTION     Patient Active Problem List   Diagnosis Date Noted   Arthrofibrosis of knee joint, right 05/12/2023   Hypertensive heart disease 07/06/2018   Asthma 07/06/2018   Sleep apnea 07/06/2018   Morbid obesity (HCC) 07/06/2018   Dyspnea 10/27/2011    PCP: Rae Bugler  REFERRING PROVIDER: Wilhelmenia Harada  REFERRING DIAG:  [phone] (ICD-10-CM) - Complex tear of medial meniscus of right knee as current injury, subsequent encounter    THERAPY DIAG:  S/P lateral meniscus repair of right knee  Stiffness of right knee, not elsewhere classified  Acute pain of right knee  Rationale for Evaluation and Treatment: Rehabilitation  ONSET DATE: 05/12/23  SUBJECTIVE STATEMENT:  knee looks swollen to me.  Earlier today it was doing well but then I noticed I was having trouble walking about an hour or 2 ago. I am aggravated because it still hurts.    PERTINENT HISTORY: R arthroscopy with medial meniscal repair 05/12/23 DM, depression, HTN, anxiety, obesity   PAIN:  Are you having pain? 0/10 before tx 3/10 after tx PRECAUTIONS: Knee  RED FLAGS: None   WEIGHT BEARING RESTRICTIONS: No  FALLS:  Has patient fallen in last 6 months? 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  DIAGNOSTIC FINDINGS: IMPRESSION: 1. Intrasubstance degeneration within junction of the body and posterior horn of the medial meniscus and within the body of the lateral meniscus. No tear is seen extending through an articular surface of either meniscus. 2. Mild-to-moderate mucoid  degeneration of the ACL (ACL cyst). No ACL tear is seen. 3. Mild tricompartmental cartilage degenerative changes. 4. Moderate joint effusion.   COGNITION: Overall cognitive status: Within functional limits for tasks assessed     SENSATION: WFL  EDEMA:  Swelling around R  MUSCLE LENGTH: Hamstrings: tightness in R hamstring  PALPATION: Pain with flexion PROM  LOWER EXTREMITY ROM:  Active ROM Right Eval Seated  Left eval AROM  Right  05/25/23  Hip flexion     Hip extension     Hip abduction     Hip adduction     Hip internal rotation     Hip external rotation     Knee flexion 86 w/pain  95  Knee extension 15   10  Ankle dorsiflexion     Ankle  plantarflexion     Ankle inversion     Ankle eversion      (Blank rows = not tested)  LOWER EXTREMITY MMT:  MMT Right eval Left eval  Hip flexion 5   Hip extension    Hip abduction    Hip adduction    Hip internal rotation    Hip external rotation    Knee flexion 4+   Knee extension 5   Ankle dorsiflexion    Ankle plantarflexion    Ankle inversion    Ankle eversion     (Blank rows = not tested)   FUNCTIONAL TESTS:  5 times sit to stand: 19.54s from elevated mat table  Timed up and go (TUG): 12.78s antalgic   GAIT: Distance walked: in clinic distances  Assistive device utilized: None Level of assistance: Modified independence Comments: antalgic gait, slowed speed, decreased knee flexion                                                                          TREATMENT DATE:  07/14/23 NuStep L5 x64mins Bike L3 x20mins  Squats to 8" + 4" on top of treadmill x10  Step up 6" Wall sit x10 Kneeling on half moon bolster  Leg press 30# 2x10 seat 7, 20# RLE x10 from seat 9   07/09/23 PROM to R knee Recheck goals Walk outdoors Bike L2 x42mins  Calf stretch Calf raises 2x10 Step ups 6"  Leg ext 15# 2x10  HS curls 35# 2x10  07/02/23  Elliptcal 2 min fwd/ 2 min backward STS 20 inches 10 x Holding TM and kneeling down to ariex pad on TM 10 x each leg- heavy UE use even with cuing Kneeling on airex on mat with wt shifts and coming up tall 10 x Belt mobs to increase Flexion Passive flexion stretching ROM after   Act  103       Pass   109 Green tband HS curl 2 sets 10 4 inch step down 10 x the 6 in 10 x- with UE support Leg press 20# working on getting more and more flexion then did without weight working VASO High Pressure   06/24/23 Nustep level 5 x 5 minutes Bike partial revs then full going backwards Leg press 20# working on getting more and more flexion then did without weight working on flexion 10# straight arm pulls on airex Worked on him kneeling on the  right knee with a large bolster and then with 3 airex pads trying to go lower, using bar to pull up and some assist PROM into flexion, use of the Tgun anterior and posterior knee Vaso high pressure 34 degrees high pressure   06/18/23 NuStep L5x70mins  Full revolutions on bike x3 mins with compensations PROM pushing into knee flexion and holding  RLE 5# LAQ hold 30s x3 Blue band HS curls 2x15 5# cable hip ext and abd  Leg press 40# 2x10  06/15/23 NuStep L5x22mins  Step ups 6" Lateral heel taps 6" Anterior heel taps 6"- stopped after 2 attempts STS 2x10 HS curls 35# 2x10, RLE 20# x10  Leg ext 10# 2x10, RLE 5# x10 Leg press 30# 2x10 RLE, ROM x10  Vaso high pressure 10 mins  06/10/23 PROM R knee flex/ext  NuStep L5x25mins Step ups forward and laterally Calf raises 2x12  Calf stretch 15s x2 Resisted gait 20# 3 way x5  Leg press 20# 2x10  Vaso high pressure  06/04/23 NuStep L5x50mins  Bike ROM partial revs x3 mins PROM to R knee flex/ext  LAQ 3# 2x10 HS curls green 2x10 STM with theragun to R low back  STS x10 Step overs 4" Anterior heel tap 4" Calf stretch 30s on slant  Vaso high pressure  06/01/23 Bike attempt- unable due to decreased ROM patellar mobs Rt knee ant/post mobs PROM Rt knee flex/ ext LAQ 3# 2x10- hold at TKE S2S 2x10 HS curl green band x10    Black bar heel raise 2x10 6in step up x10 each leg- cue to avoid circumduction 4in step down rt leg on box 2x10 Vaso high pressure    05/28/22 NuStep L4x59mins LE only Calf stretch 30s  Calf raises 2x10  PROM to R knee end range holds, patellar mobs  LAQ 3# 2x10 HS curls green 2x10 STS elevated table 2x10 Step ups 4"  SLR 2# 2x10 SAQ 2# 2x10 Vaso high pressure 35 degrees  05/25/23 Nustep level 3 x 6 minutes LE only Bike partial revs x 4 minutes some partial revs able to do a few full revs Leg press 20# x10 both legs, no weight right only, then 20# right only with small ROM PROM some  joint mobs CAlf stretches Resisted gait with tband fwd and backward On airex weight shift and march Practiced walking with cues for step length and a natural bend to the knee, tends to keep knee stiff and circumduct the leg Vaso medium pressure 35 degrees  05/21/23 Nustep L4 PROM with end range hold  Joint knee mobs ant & post QS x10 SLR x10 Seated knee ext x10 Sit to stand 2x10 Gait emphasize Hip flexion and heel strike 42ft PF stretch 6in step up x6, too much compensation with circumduction, switched to 4in x10 2in step up on airex pad 2x10    05/18/23- EVAL, HEP    PATIENT EDUCATION:  Education details: POC, HEP, icing and elevating Person educated: Patient Education method: Explanation Education comprehension: verbalized understanding  HOME EXERCISE PROGRAM: Access Code: [email or web address] URL: [email or web address] Date: 05/18/2023 Prepared by: Donald Payne  Exercises - Supine Quad Set  - 1 x daily - 7 x weekly - 2 sets - 10 reps - Supine Heel Slide with Strap  - 1 x daily - 7 x weekly - 2 sets - 10 reps - Sit to Stand  - 1 x daily - 7 x weekly - 2 sets - 10 reps - Seated Long Arc Quad  -  1 x daily - 7 x weekly - 2 sets - 10 reps  ASSESSMENT:  CLINICAL IMPRESSION: Pt. ambulated in with antalgic gait. He is aggravated that his knee is not better yet and he is still having pain. He still has ROM limitations. His knee is popping now. We continued to work to push for range and strength improvements. Sees his Dr on 07/23/23. Compensates with wall sits and squats-- ER of R leg and pushes off more on LLE. He has difficulty with kneeling still, he is unable to get up without using arms to pull up.    Patient is a 58 y.o. male who was seen today for physical therapy evaluation and treatment for R knee pain s/p mensicus surgery on 4/1. His biggest limitation is knee flexion, he is also missing some TKE. Patient reports low pain levels. He is walking with antalgic gait,  no AD and has an ace wrap bandage around his incision. Has a follow up with his doctor 05/21/23. Patient will benefit from skilled PT to address his R knee deficits to return to PLOF.   OBJECTIVE IMPAIRMENTS: Abnormal gait, decreased mobility, difficulty walking, decreased ROM, decreased strength, and pain.   ACTIVITY LIMITATIONS: squatting, stairs, transfers, and locomotion level  PARTICIPATION LIMITATIONS: shopping, community activity, occupation, and yard work  PERSONAL FACTORS: Age and Fitness are also affecting patient's functional outcome.   REHAB POTENTIAL: Good  CLINICAL DECISION MAKING: Stable/uncomplicated  EVALUATION COMPLEXITY: Low  GOALS: Goals reviewed with patient? Yes   SHORT TERM GOALS: Target date: 06/29/23  Independent with initial HEP. Baseline: given 05/18/23 Goal status: met 06/24/23   2. Patient will demonstrate improved functional LE strength by completing 5x STS in <15 seconds. Baseline: 19.54s from elevated mat  Goal status:07/02/23   LONG TERM GOALS: Target date: 08/10/23  Independent with advanced/ongoing HEP to improve outcomes and carryover.  Baseline:  Goal status: evolving 07/02/23  2.  Donald Payne will demonstrate R knee flexion to 120 deg to ascend/descend stairs. Baseline: 86 w/pain Goal status: 07/02/23 progressing, 100d 07/09/23  3.  Donald Payne will demonstrate full R knee extension for safety with gait. Baseline: 15 Goal status: IN PROGRESS 10d 07/09/23  4.  Donald Payne will be able to ambulate at least 600' safely with LRAD and normal gait pattern to access community.  Baseline: antalgic gait Goal status: IN PROGRESS  5.  Donald Payne will be able to put his socks on and off.  Baseline: unable to do Goal status: MET 07/09/23    PLAN:  PT FREQUENCY: 2x/week  PT DURATION: 12 weeks  PLANNED INTERVENTIONS: 97110-Therapeutic exercises, 97530- Therapeutic activity, 97112- Neuromuscular re-education, 97535- Self Care,  97140- Manual therapy, [phone]- Gait training, 21308- Vasopneumatic device, Patient/Family education, Balance training, Stair training, Taping, Dry Needling, Joint mobilization, and Cryotherapy  PLAN FOR NEXT SESSION: R knee strengthening, emphasize R knee flexion PROM, functional tasks- steps, STS, gait    Donald Payne, PT 07/14/2023, 6:11 PM [facility] Out[facility] Rehabilitation at [facility] [address] [facility] [place], [place], [address] Phone: [phone]   Fax:  [phone] Rehabilitation at [facility] [address] [place], [place], [address] Phone: [phone]   Fax:  [phone]  Patient Details  Name: NIKHIL OSEI MRN: [id number] Date of Birth: 05-06-1965 Referring Provider:  Wilhelmenia Harada, MD

## 2023-07-14 NOTE — Progress Notes (Signed)
 Donald Payne [id number] 04-Aug-1965 58 y.o.  Virtual Visit via Video Note  I connected with pt @ on 07/14/23 at  2:30 PM EDT by a video enabled telemedicine application and verified that I am speaking with the correct person using two identifiers.   I discussed the limitations of evaluation and management by telemedicine and the availability of in person appointments. The patient expressed understanding and agreed to proceed.  I discussed the assessment and treatment plan with the patient. The patient was provided an opportunity to ask questions and all were answered. The patient agreed with the plan and demonstrated an understanding of the instructions.   The patient was advised to call back or seek an in-person evaluation if the symptoms worsen or if the condition fails to improve as anticipated.  I provided 15 minutes of non-face-to-face time during this encounter.  The patient was located at home.  The provider was located at [facility].   Reagan Camera, NP   Subjective:   Patient ID:  Donald Payne is a 58 y.o. (DOB 09-26-1965) male.  Chief Complaint: No chief complaint on file.   HPI Donald Payne presents for follow-up of MDD, GAD and OCD.  Previously seen at the [facility].   Describes mood today as "ok". Pleasant. Denies tearfulness. Mood symptoms - denies depression, anxiety and irritability. Reports stable interest and motivation. Denies recent panic attacks. Denies worry, rumination and over thinking. Reports mood as stable. Stating "I feel like everything is going ok". Taking medications as prescribed.  Energy levels improved. Active, does not have a regular exercise routine.  Enjoys some usual interests and activities. Married. Lives with wife. Wife is a traveling Engineer, civil (consulting). No children - stepchildren. Spending time with family. Appetite adequate. Weight 245 to 250 pounds. Sleeps well most nights. Averages 7 to 8 hours. Focus and  concentration stable. Completing tasks. Managing aspects of household. Retired - [employer]. Has applied for a job at [employer]. Denies SI or HI.  Denies AH or VH. Denies self harm.  Denies substance use.  Previous medication trials: Ambien, Lithium, Caplyta and others.   Review of Systems:  Review of Systems  Musculoskeletal:  Negative for gait problem.  Neurological:  Negative for tremors.  Psychiatric/Behavioral:         Please refer to HPI    Medications: I have reviewed the patient's current medications.  Current Outpatient Medications  Medication Sig Dispense Refill   albuterol  (PROVENTIL ) (2.5 MG/3ML) 0.083% nebulizer solution Take 2.5 mg by nebulization 4 (four) times daily as needed for wheezing or shortness of breath.     albuterol  (VENTOLIN  HFA) 108 (90 Base) MCG/ACT inhaler Can inhale two puffs every four to six hours as needed for cough or wheeze. 18 g 0   ALPRAZolam  (XANAX ) 0.5 MG tablet Take 1 tablet (0.5 mg total) by mouth at bedtime as needed for anxiety. 30 tablet 2   aspirin  EC 325 MG tablet Take 1 tablet (325 mg total) by mouth daily. 30 tablet 0   azelastine  (ASTELIN ) 0.1 % nasal spray Can use one to two sprays in each nostril one to two times daily as directed. (Patient not taking: Reported on 05/06/2023) 30 mL 5   busPIRone  (BUSPAR ) 10 MG tablet Take 2 tablets (20 mg total) by mouth 2 (two) times daily. TAKE 2 TABLETS BY MOUTH 2 TIMES DAILY. 360 tablet 1   celecoxib  (CELEBREX ) 200 MG capsule TAKE 1 CAPSULE (200 MG TOTAL) BY MOUTH 2 (TWO) TIMES  DAILY BETWEEN MEALS AS NEEDED. (Patient not taking: Reported on 05/06/2023) 60 capsule 1   doxepin  (SINEQUAN ) 10 MG capsule Take 1 capsule (10 mg total) by mouth at bedtime. 30 capsule 2   Erenumab -aooe (AIMOVIG ) 70 MG/ML SOAJ INJECT 70 MG INTO THE SKIN EVERY 30 DAYS (Patient not taking: Reported on 05/06/2023) 1 mL 1   famotidine  (PEPCID ) 40 MG tablet TAKE ONE TABLET ONCE EACH EVENING 30 tablet 0   fenofibrate micronized  (ANTARA) 43 MG capsule Take 43 mg by mouth daily.     gabapentin  (NEURONTIN ) 300 MG capsule TAKE 2 CAPSULES BY MOUTH 2 TIMES DAILY. 120 capsule 3   HUMALOG 100 UNIT/ML injection Inject 0-200 Units into the skin See admin instructions. Used with Omnipod, fill with 200 units every 3 days     HYDROcodone -acetaminophen  (NORCO) 7.5-325 MG tablet Take 1 tablet by mouth every 6 (six) hours as needed for moderate pain. (Patient not taking: Reported on 05/06/2023) 30 tablet 0   JARDIANCE 25 MG TABS tablet Take 25 mg by mouth daily.     L-Methylfolate 15 MG TABS Take 15 mg by mouth daily.     lisinopril-hydrochlorothiazide (ZESTORETIC) 20-12.5 MG tablet Take 1 tablet by mouth daily.     MOUNJARO 10 MG/0.5ML Pen Inject 10 mg into the skin once a week.     oxyCODONE  (ROXICODONE ) 5 MG immediate release tablet Take 1 tablet (5 mg total) by mouth every 4 (four) hours as needed for severe pain (pain score 7-10) or breakthrough pain. 10 tablet 0   pantoprazole  (PROTONIX ) 40 MG tablet TAKE TWO TABLETS EVERY MORNING AS DIRECTED. 60 tablet 0   pramipexole  (MIRAPEX ) 1.5 MG tablet Take 1 tablet (1.5 mg total) by mouth daily. 45 tablet 2   predniSONE  (DELTASONE ) 20 MG tablet Take 1 tablet (20 mg total) by mouth daily with breakfast. (Patient not taking: Reported on 05/06/2023) 6 tablet 0   rizatriptan (MAXALT) 10 MG tablet Take 10 mg by mouth as needed for migraine.     simvastatin (ZOCOR) 40 MG tablet Take 40 mg by mouth every evening.     SUMAtriptan  (IMITREX ) 100 MG tablet Take 1 tablet (100 mg total) by mouth every 2 (two) hours as needed for migraine. May repeat in 2 hours if headache persists or recurs. (Patient not taking: Reported on 05/06/2023) 10 tablet 2   SYMBICORT  160-4.5 MCG/ACT inhaler INHALE TWO PUFFS TWICE DAILY TO PREVENT COUGH OR WHEEZE. RINSE MOUTH AFTER USE. (Patient not taking: Reported on 05/06/2023) 10.2 each 0   TRINTELLIX  20 MG TABS tablet Take 1 tablet (20 mg total) by mouth daily. 30 tablet 2    VITAMIN D PO Take 5,000 Units by mouth daily.     No current facility-administered medications for this visit.    Medication Side Effects: None  Allergies:  Allergies  Allergen Reactions   Atorvastatin Other (See Comments)    Muscle soreness  Muscle soreness  Muscle soreness  Muscle soreness, Muscle soreness   Budesonide-Formoterol Fumarate Other (See Comments)    Tongue/throat soreness   Metformin Hcl Diarrhea    Other reaction(s): Diarhea    Past Medical History:  Diagnosis Date   Allergic rhinitis    Allergy    Anxiety    Asthma    Depression    Diabetes mellitus (HCC)    Dyslipidemia    Fatigue    GERD (gastroesophageal reflux disease)    Headache    Hemorrhoids    Hyperlipidemia    Hypertension  Hypoxia    IBS (irritable bowel syndrome)    Low testosterone    Prostatitis    Sleep apnea    uses CPAP   Sleep apnea with hypersomnolence    Vitamin D deficiency     Family History  Problem Relation Age of Onset   Migraines Mother    Colon cancer Mother    Hypertension Mother    Diabetes Mother    Heart disease Father    Diabetes Paternal Grandmother    Heart disease Paternal Grandmother    Prostate cancer Paternal Grandfather    Healthy Other    Coronary artery disease Other        Deceased age 21   Esophageal cancer Neg Hx    Rectal cancer Neg Hx    Stomach cancer Neg Hx     Social History   Socioeconomic History   Marital status: Married    Spouse name: [name]   Number of children: 0   Years of education: Not on file   Highest education level: Master's degree (e.g., MA, MS, MEng, MEd, MSW, MBA)  Occupational History   Occupation: Field seismologist  Tobacco Use   Smoking status: Never   Smokeless tobacco: Never  Vaping Use   Vaping status: Never Used  Substance and Sexual Activity   Alcohol use: Not Currently   Drug use: No   Sexual activity: Not on file  Other Topics Concern   Not on file  Social History Narrative   Lives with  wife   Caffeine- 3-4 diet sodas a day   Social Drivers of Corporate investment banker Strain: Not on file  Food Insecurity: Not on file  Transportation Needs: Not on file  Physical Activity: Not on file  Stress: Not on file  Social Connections: Not on file  Intimate Partner Violence: Not on file    Past Medical History, Surgical history, Social history, and Family history were reviewed and updated as appropriate.   Please see review of systems for further details on the patient's review from today.   Objective:   Physical Exam:  There were no vitals taken for this visit.  Physical Exam Constitutional:      General: He is not in acute distress. Musculoskeletal:        General: No deformity.  Neurological:     Mental Status: He is alert and oriented to person, place, and time.     Coordination: Coordination normal.  Psychiatric:        Attention and Perception: Attention and perception normal. He does not perceive auditory or visual hallucinations.        Mood and Affect: Mood normal. Mood is not anxious or depressed. Affect is not labile, blunt, angry or inappropriate.        Speech: Speech normal.        Behavior: Behavior normal.        Thought Content: Thought content normal. Thought content is not paranoid or delusional. Thought content does not include homicidal or suicidal ideation. Thought content does not include homicidal or suicidal plan.        Cognition and Memory: Cognition and memory normal.        Judgment: Judgment normal.     Comments: Insight intact     Lab Review:     Component Value Date/Time   NA 140 05/11/2023 1400   K 3.9 05/11/2023 1400   CL 102 05/11/2023 1400   CO2 28 05/11/2023 1400   GLUCOSE 122 (  H) 05/11/2023 1400   BUN 25 (H) 05/11/2023 1400   CREATININE 1.33 (H) 05/11/2023 1400   CALCIUM 9.7 05/11/2023 1400   GFRNONAA >60 05/11/2023 1400       Component Value Date/Time   WBC 8.8 05/11/2023 1400   RBC 5.67 05/11/2023 1400   HGB  15.8 05/11/2023 1400   HGB 16.1 04/08/2018 0811   HCT 48.7 05/11/2023 1400   HCT 47.4 04/08/2018 0811   PLT 239 05/11/2023 1400   PLT 165 04/08/2018 0811   MCV 85.9 05/11/2023 1400   MCV 87 04/08/2018 0811   MCH 27.9 05/11/2023 1400   MCHC 32.4 05/11/2023 1400   RDW 14.0 05/11/2023 1400   RDW 14.2 04/08/2018 0811   LYMPHSABS 1.1 04/08/2018 0811   EOSABS 0.3 04/08/2018 0811   BASOSABS 0.1 04/08/2018 0811    No results found for: "POCLITH", "LITHIUM"   No results found for: "PHENYTOIN", "PHENOBARB", "VALPROATE", "CBMZ"   .res Assessment: Plan:    Treatment Plan/Recommendations:   Plan:  PDMP reviewed  Trintellix  20mg  daily Doxepin  10mg  at hs  Buspar  20mg  BID  Mirapex  1.5mg  daily - plans to taper off  D/C Xanax  0.5mg  at hs for sleep - was taking occasionally, but no longer taking.  RTC 3 months  15 minutes spent dedicated to the care of this patient on the date of this encounter to include pre-visit review of records, ordering of medication, post visit documentation, and face-to-face time with the patient discussing MDD, GAD, OCD, and insomnia. Discussed continuing current medication regimen.  Patient advised to contact office with any questions, adverse effects, or acute worsening in signs and symptoms.   Discussed potential benefits, risk, and side effects of benzodiazepines to include potential risk of tolerance and dependence, as well as possible drowsiness.  Advised patient not to drive if experiencing drowsiness and to take lowest possible effective dose to minimize risk of dependence and tolerance.    There are no diagnoses linked to this encounter.   Please see After Visit Summary for patient specific instructions.  Future Appointments  Date Time Provider Department Center  07/14/2023  2:30 PM Loletha Bertini, Ursula [place], NP [facility] None  07/14/2023  5:15 PM Donavon Fudge, PT [facility]  07/16/2023  5:15 PM Dama Duffel [place], PT [facility]  07/23/2023  8:45 AM  Wilhelmenia Harada, MD [facility]  08/04/2023  5:00 PM Hollis Lurie, PT [facility]  08/06/2023  4:30 PM Sajjad, Jefferey Minerva, PT [facility]    No orders of the defined types were placed in this encounter.     -------------------------------

## 2023-07-16 ENCOUNTER — Ambulatory Visit

## 2023-07-16 DIAGNOSIS — M25661 Stiffness of right knee, not elsewhere classified: Secondary | ICD-10-CM

## 2023-07-16 DIAGNOSIS — M25561 Pain in right knee: Secondary | ICD-10-CM

## 2023-07-16 DIAGNOSIS — Z9889 Other specified postprocedural states: Secondary | ICD-10-CM

## 2023-07-16 NOTE — Therapy (Signed)
 OUTPATIENT PHYSICAL THERAPY LOWER EXTREMITY    Patient Name: Donald Payne MRN: [id number] DOB:March 19, 1965, 58 y.o., male Today's Date: 07/16/2023  END OF SESSION:  PT End of Session - 07/16/23 1659     Visit Number 13    Date for PT Re-Evaluation 08/10/23    PT Start Time 1700    PT Stop Time 1745    PT Time Calculation (min) 45 min                     Past Medical History:  Diagnosis Date   Allergic rhinitis    Allergy    Anxiety    Asthma    Depression    Diabetes mellitus (HCC)    Dyslipidemia    Fatigue    GERD (gastroesophageal reflux disease)    Headache    Hemorrhoids    Hyperlipidemia    Hypertension    Hypoxia    IBS (irritable bowel syndrome)    Low testosterone    Prostatitis    Sleep apnea    uses CPAP   Sleep apnea with hypersomnolence    Vitamin D deficiency    Past Surgical History:  Procedure Laterality Date   KNEE ARTHROSCOPY Right    KNEE ARTHROSCOPY WITH MENISCAL REPAIR Right 05/12/2023   Procedure: ARTHROSCOPY, KNEE, WITH MENISCUS REPAIR;  Surgeon: Wilhelmenia Harada, MD;  Location: [facility] OR;  Service: Orthopedics;  Laterality: Right;  RIGHT KNEE ARTHROSCOPY WITH MEDIAL MENISCUS REPAIR AND LYSIS OF ADHESIONS   WISDOM TOOTH EXTRACTION     Patient Active Problem List   Diagnosis Date Noted   Arthrofibrosis of knee joint, right 05/12/2023   Hypertensive heart disease 07/06/2018   Asthma 07/06/2018   Sleep apnea 07/06/2018   Morbid obesity (HCC) 07/06/2018   Dyspnea 10/27/2011    PCP: Rae Bugler  REFERRING PROVIDER: Wilhelmenia Harada  REFERRING DIAG:  [phone] (ICD-10-CM) - Complex tear of medial meniscus of right knee as current injury, subsequent encounter    THERAPY DIAG:  S/P lateral meniscus repair of right knee  Stiffness of right knee, not elsewhere classified  Acute pain of right knee  Rationale for Evaluation and Treatment: Rehabilitation  ONSET DATE: 05/12/23  SUBJECTIVE STATEMENT:  it popped a bit ago and feels  looser and less pain.    PERTINENT HISTORY: R arthroscopy with medial meniscal repair 05/12/23 DM, depression, HTN, anxiety, obesity   PAIN:  Are you having pain? 0/10 before tx 3/10 after tx PRECAUTIONS: Knee  RED FLAGS: None   WEIGHT BEARING RESTRICTIONS: No  FALLS:  Has patient fallen in last 6 months? 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  DIAGNOSTIC FINDINGS: IMPRESSION: 1. Intrasubstance degeneration within junction of the body and posterior horn of the medial meniscus and within the body of the lateral meniscus. No tear is seen extending through an articular surface of either meniscus. 2. Mild-to-moderate mucoid degeneration of the ACL (ACL cyst). No ACL tear is seen. 3. Mild tricompartmental cartilage degenerative changes. 4. Moderate joint effusion.  COGNITION: Overall cognitive status: Within functional limits for tasks assessed     SENSATION: WFL  EDEMA:  Swelling around R  MUSCLE LENGTH: Hamstrings: tightness in R hamstring  PALPATION: Pain with flexion PROM  LOWER EXTREMITY ROM:  Active ROM Right Eval Seated  Left eval AROM  Right  05/25/23  Hip flexion     Hip extension     Hip abduction     Hip adduction     Hip internal rotation     Hip external rotation     Knee flexion 86 w/pain  95  Knee extension 15   10  Ankle dorsiflexion     Ankle plantarflexion     Ankle inversion     Ankle eversion      (Blank rows = not tested)  LOWER EXTREMITY MMT:  MMT  Right eval Left eval  Hip flexion 5   Hip extension    Hip abduction    Hip adduction    Hip internal rotation    Hip external rotation    Knee flexion 4+   Knee extension 5   Ankle dorsiflexion    Ankle plantarflexion    Ankle inversion    Ankle eversion     (Blank rows = not tested)   FUNCTIONAL TESTS:  5 times sit to stand: 19.54s from elevated mat table  Timed up and go (TUG): 12.78s antalgic   GAIT: Distance walked: in clinic distances  Assistive device utilized: None Level of assistance: Modified independence Comments: antalgic gait, slowed speed, decreased knee flexion                                                                          TREATMENT DATE:  07/16/23 5xSTS- 13.65s Elliptical L2 x50mins NuStep L5x78mins LE only  Treadmill pushes 20s x2  STS  with OHP blue ball Leg ext 5# 2x10 RLE  HS curls 20# 2x10 RLE  KT tape to R knee   07/14/23 NuStep L5 x82mins Bike L3 x21mins  Squats to 8" + 4" on top of treadmill x10  Step up 6" Wall sit x10 Kneeling on half moon bolster  Leg press 30# 2x10 seat 7, 20# RLE x10 from seat 9   07/09/23 PROM to R knee Recheck goals Walk outdoors Bike L2 x48mins  Calf stretch Calf raises 2x10 Step ups 6"  Leg ext 15# 2x10  HS curls 35# 2x10  07/02/23  Elliptcal 2 min fwd/ 2 min backward STS 20 inches 10 x Holding TM and kneeling down to ariex pad on TM 10 x each leg- heavy UE use even with cuing Kneeling on airex on mat with wt shifts and coming up tall 10 x Belt mobs to increase Flexion Passive flexion stretching ROM after   Act  103       Pass   109 Green tband HS curl 2 sets 10 4 inch step down 10 x the 6 in 10 x- with UE support Leg press 20# working on getting more and more flexion then did without weight working VASO High Pressure   06/24/23 Nustep level 5 x 5 minutes Bike partial revs then full going backwards Leg press 20# working on getting more and more flexion then did  without weight working on  flexion 10# straight arm pulls on airex Worked on him kneeling on the right knee with a large bolster and then with 3 airex pads trying to go lower, using bar to pull up and some assist PROM into flexion, use of the Tgun anterior and posterior knee Vaso high pressure 34 degrees high pressure   06/18/23 NuStep L5x17mins  Full revolutions on bike x3 mins with compensations PROM pushing into knee flexion and holding  RLE 5# LAQ hold 30s x3 Blue band HS curls 2x15 5# cable hip ext and abd  Leg press 40# 2x10  06/15/23 NuStep L5x37mins  Step ups 6" Lateral heel taps 6" Anterior heel taps 6"- stopped after 2 attempts STS 2x10 HS curls 35# 2x10, RLE 20# x10  Leg ext 10# 2x10, RLE 5# x10 Leg press 30# 2x10 RLE, ROM x10  Vaso high pressure 10 mins  06/10/23 PROM R knee flex/ext  NuStep L5x49mins Step ups forward and laterally Calf raises 2x12  Calf stretch 15s x2 Resisted gait 20# 3 way x5  Leg press 20# 2x10  Vaso high pressure  06/04/23 NuStep L5x15mins  Bike ROM partial revs x3 mins PROM to R knee flex/ext  LAQ 3# 2x10 HS curls green 2x10 STM with theragun to R low back  STS x10 Step overs 4" Anterior heel tap 4" Calf stretch 30s on slant  Vaso high pressure  06/01/23 Bike attempt- unable due to decreased ROM patellar mobs Rt knee ant/post mobs PROM Rt knee flex/ ext LAQ 3# 2x10- hold at TKE S2S 2x10 HS curl green band x10    Black bar heel raise 2x10 6in step up x10 each leg- cue to avoid circumduction 4in step down rt leg on box 2x10 Vaso high pressure    05/28/22 NuStep L4x20mins LE only Calf stretch 30s  Calf raises 2x10  PROM to R knee end range holds, patellar mobs  LAQ 3# 2x10 HS curls green 2x10 STS elevated table 2x10 Step ups 4"  SLR 2# 2x10 SAQ 2# 2x10 Vaso high pressure 35 degrees  05/25/23 Nustep level 3 x 6 minutes LE only Bike partial revs x 4 minutes some partial revs able to do a few full revs Leg press 20# x10 both legs,  no weight right only, then 20# right only with small ROM PROM some joint mobs CAlf stretches Resisted gait with tband fwd and backward On airex weight shift and march Practiced walking with cues for step length and a natural bend to the knee, tends to keep knee stiff and circumduct the leg Vaso medium pressure 35 degrees  05/21/23 Nustep L4 PROM with end range hold  Joint knee mobs ant & post QS x10 SLR x10 Seated knee ext x10 Sit to stand 2x10 Gait emphasize Hip flexion and heel strike 60ft PF stretch 6in step up x6, too much compensation with circumduction, switched to 4in x10 2in step up on airex pad 2x10    05/18/23- EVAL, HEP    PATIENT EDUCATION:  Education details: POC, HEP, icing and elevating Person educated: Patient Education method: Explanation Education comprehension: verbalized understanding  HOME EXERCISE PROGRAM: Access Code: [email or web address] URL: [email or web address] Date: 05/18/2023 Prepared by: Donavon Fudge  Exercises - Supine Quad Set  - 1 x daily - 7 x weekly - 2 sets - 10 reps - Supine Heel Slide with Strap  - 1 x daily - 7 x weekly - 2 sets - 10 reps - Sit to Stand  -  1 x daily - 7 x weekly - 2 sets - 10 reps - Seated Long Arc Quad  - 1 x daily - 7 x weekly - 2 sets - 10 reps  ASSESSMENT:  CLINICAL IMPRESSION: Patient continues to have issues with his R knee. He reports popping today but it seemed to help his pain.  We continued to work to push for range and strength improvements. He asked if we could tape his knee as his wife told him to ask about KT tape. Shaking noted with leg extension, weakness still present. Sees his Dr on 07/23/23.   Patient is a 58 y.o. male who was seen today for physical therapy evaluation and treatment for R knee pain s/p mensicus surgery on 4/1. His biggest limitation is knee flexion, he is also missing some TKE. Patient reports low pain levels. He is walking with antalgic gait, no AD and has an ace wrap  bandage around his incision. Has a follow up with his doctor 05/21/23. Patient will benefit from skilled PT to address his R knee deficits to return to PLOF.   OBJECTIVE IMPAIRMENTS: Abnormal gait, decreased mobility, difficulty walking, decreased ROM, decreased strength, and pain.   ACTIVITY LIMITATIONS: squatting, stairs, transfers, and locomotion level  PARTICIPATION LIMITATIONS: shopping, community activity, occupation, and yard work  PERSONAL FACTORS: Age and Fitness are also affecting patient's functional outcome.   REHAB POTENTIAL: Good  CLINICAL DECISION MAKING: Stable/uncomplicated  EVALUATION COMPLEXITY: Low  GOALS: Goals reviewed with patient? Yes   SHORT TERM GOALS: Target date: 06/29/23  Independent with initial HEP. Baseline: given 05/18/23 Goal status: met 06/24/23   2. Patient will demonstrate improved functional LE strength by completing 5x STS in <15 seconds. Baseline: 19.54s from elevated mat  Goal status:07/02/23, 13.65s MET 07/16/23   LONG TERM GOALS: Target date: 08/10/23  Independent with advanced/ongoing HEP to improve outcomes and carryover.  Baseline:  Goal status: evolving 07/02/23  2.  GRACEN SOUTHWELL will demonstrate R knee flexion to 120 deg to ascend/descend stairs. Baseline: 86 w/pain Goal status: 07/02/23 progressing, 100d 07/09/23  3.  TAYTON DECAIRE will demonstrate full R knee extension for safety with gait. Baseline: 15 Goal status: IN PROGRESS 10d 07/09/23  4.  KAIKOA MAGRO will be able to ambulate at least 600' safely with LRAD and normal gait pattern to access community.  Baseline: antalgic gait Goal status: IN PROGRESS  5.  BASSAM DRESCH will be able to put his socks on and off.  Baseline: unable to do Goal status: MET 07/09/23    PLAN:  PT FREQUENCY: 2x/week  PT DURATION: 12 weeks  PLANNED INTERVENTIONS: 97110-Therapeutic exercises, 97530- Therapeutic activity, 97112- Neuromuscular re-education, 97535- Self Care, [id number]-  Manual therapy, [phone]- Gait training, [phone]- Vasopneumatic device, Patient/Family education, Balance training, Stair training, Taping, Dry Needling, Joint mobilization, and Cryotherapy  PLAN FOR NEXT SESSION: R knee strengthening, emphasize R knee flexion PROM, functional tasks- steps, STS, gait    Donavon Fudge, PT 07/16/2023, 5:44 PM [facility] at [facility]. [place], [place], [address] Phone: [phone]   Fax:  [phone] [address] [place], [place], [address] Phone: [phone]   Fax:  [phone]  Patient Details  Name: AYDDEN CUMPIAN MRN: [id number] Date of Birth: [birth year] Referring Provider:  Wilhelmenia Harada, MD

## 2023-07-17 ENCOUNTER — Telehealth: Payer: Self-pay | Admitting: Adult Health

## 2023-07-17 DIAGNOSIS — Z0289 Encounter for other administrative examinations: Secondary | ICD-10-CM

## 2023-07-17 NOTE — Telephone Encounter (Addendum)
 Received fax from [employer] to complete and return. Gave to Traci to complete. Charge of $25 to complete. Pt paid

## 2023-07-17 NOTE — Telephone Encounter (Signed)
 Paperwork for [employer] completed and faxed

## 2023-07-17 NOTE — Telephone Encounter (Signed)
Pt aware.

## 2023-07-21 ENCOUNTER — Ambulatory Visit

## 2023-07-21 DIAGNOSIS — M25661 Stiffness of right knee, not elsewhere classified: Secondary | ICD-10-CM

## 2023-07-21 DIAGNOSIS — Z9889 Other specified postprocedural states: Secondary | ICD-10-CM

## 2023-07-21 DIAGNOSIS — M25561 Pain in right knee: Secondary | ICD-10-CM

## 2023-07-21 NOTE — Therapy (Signed)
 OUTPATIENT PHYSICAL THERAPY LOWER EXTREMITY    Patient Name: Donald Payne MRN: [id number] DOB:[birth year], 58 y.o., male Today's Date: 07/21/2023  END OF SESSION:  PT End of Session - 07/21/23 1702     Visit Number 14    Date for PT Re-Evaluation 08/10/23    PT Start Time 1710    PT Stop Time 1755    PT Time Calculation (min) 45 min                      Past Medical History:  Diagnosis Date   Allergic rhinitis    Allergy    Anxiety    Asthma    Depression    Diabetes mellitus (HCC)    Dyslipidemia    Fatigue    GERD (gastroesophageal reflux disease)    Headache    Hemorrhoids    Hyperlipidemia    Hypertension    Hypoxia    IBS (irritable bowel syndrome)    Low testosterone    Prostatitis    Sleep apnea    uses CPAP   Sleep apnea with hypersomnolence    Vitamin D deficiency    Past Surgical History:  Procedure Laterality Date   KNEE ARTHROSCOPY Right    KNEE ARTHROSCOPY WITH MENISCAL REPAIR Right 05/12/2023   Procedure: ARTHROSCOPY, KNEE, WITH MENISCUS REPAIR;  Surgeon: Wilhelmenia Harada, MD;  Location: [facility] OR;  Service: Orthopedics;  Laterality: Right;  RIGHT KNEE ARTHROSCOPY WITH MEDIAL MENISCUS REPAIR AND LYSIS OF ADHESIONS   WISDOM TOOTH EXTRACTION     Patient Active Problem List   Diagnosis Date Noted   Arthrofibrosis of knee joint, right 05/12/2023   Hypertensive heart disease 07/06/2018   Asthma 07/06/2018   Sleep apnea 07/06/2018   Morbid obesity (HCC) 07/06/2018   Dyspnea 10/27/2011    PCP: Rae Bugler  REFERRING PROVIDER: Wilhelmenia Harada  REFERRING DIAG:  [phone] (ICD-10-CM) - Complex tear of medial meniscus of right knee as current injury, subsequent encounter    THERAPY DIAG:  S/P lateral meniscus repair of right knee  Stiffness of right knee, not elsewhere classified  Acute pain of right knee  Rationale for Evaluation and Treatment: Rehabilitation  ONSET DATE: 05/12/23  SUBJECTIVE STATEMENT:  Voltaren is helping it but  I don't know if there is any great improvement.    PERTINENT HISTORY: R arthroscopy with medial meniscal repair 05/12/23 DM, depression, HTN, anxiety, obesity   PAIN:  Are you having pain? 0/10 before tx 3/10 after tx PRECAUTIONS: Knee  RED FLAGS: None   WEIGHT BEARING RESTRICTIONS: No  FALLS:  Has patient fallen in last 6 months? 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  DIAGNOSTIC FINDINGS: IMPRESSION: 1. Intrasubstance degeneration within junction of the body and posterior horn of the medial meniscus and within the body of the lateral meniscus. No tear is seen extending through an articular surface of either meniscus. 2. Mild-to-moderate mucoid degeneration of the ACL (ACL cyst). No ACL tear is seen. 3. Mild tricompartmental cartilage degenerative changes.  4. Moderate joint effusion.   COGNITION: Overall cognitive status: Within functional limits for tasks assessed     SENSATION: WFL  EDEMA:  Swelling around R  MUSCLE LENGTH: Hamstrings: tightness in R hamstring  PALPATION: Pain with flexion PROM  LOWER EXTREMITY ROM:  Active ROM Right Eval Seated  Left eval AROM  Right  05/25/23  Hip flexion     Hip extension     Hip abduction     Hip adduction     Hip internal rotation     Hip external rotation     Knee flexion 86 w/pain  95  Knee extension 15   10  Ankle dorsiflexion     Ankle plantarflexion     Ankle inversion     Ankle eversion      (Blank rows = not tested)  LOWER  EXTREMITY MMT:  MMT Right eval Left eval  Hip flexion 5   Hip extension    Hip abduction    Hip adduction    Hip internal rotation    Hip external rotation    Knee flexion 4+   Knee extension 5   Ankle dorsiflexion    Ankle plantarflexion    Ankle inversion    Ankle eversion     (Blank rows = not tested)   FUNCTIONAL TESTS:  5 times sit to stand: 19.54s from elevated mat table  Timed up and go (TUG): 12.78s antalgic   GAIT: Distance walked: in clinic distances  Assistive device utilized: None Level of assistance: Modified independence Comments: antalgic gait, slowed speed, decreased knee flexion                                                                          TREATMENT DATE:  07/21/23 Lateral distraction to knee  Bike L3 x45mins seat 15  Resisted gait 40# 4 way x4  2 way hip 10# 2x10 Leg press 40# 2x10, RLE 20# 2x10, x10 ROM no weight Lunge on to 6" step  Chair squats x10   07/16/23 5xSTS- 13.65s Elliptical L2 x66mins NuStep L5x62mins LE only  Treadmill pushes 20s x2  STS  with OHP blue ball Leg ext 5# 2x10 RLE  HS curls 20# 2x10 RLE  KT tape to R knee   07/14/23 NuStep L5 x75mins Bike L3 x58mins  Squats to 8" + 4" on top of treadmill x10  Step up 6" Wall sit x10 Kneeling on half moon bolster  Leg press 30# 2x10 seat 7, 20# RLE x10 from seat 9   07/09/23 PROM to R knee Recheck goals Walk outdoors Bike L2 x35mins  Calf stretch Calf raises 2x10 Step ups 6"  Leg ext 15# 2x10  HS curls 35# 2x10  07/02/23  Elliptcal 2 min fwd/ 2 min backward STS 20 inches 10 x Holding TM and kneeling down to ariex pad on TM 10 x each leg- heavy UE use even with cuing Kneeling on airex on mat with wt shifts and coming up tall 10 x Belt mobs to increase Flexion Passive flexion stretching ROM after   Act  103       Pass   109 Green tband HS curl 2 sets 10 4 inch step down 10 x the 6  in 10 x- with UE support Leg press 20# working on getting more and more flexion  then did without weight working VASO High Pressure   06/24/23 Nustep level 5 x 5 minutes Bike partial revs then full going backwards Leg press 20# working on getting more and more flexion then did without weight working on flexion 10# straight arm pulls on airex Worked on him kneeling on the right knee with a large bolster and then with 3 airex pads trying to go lower, using bar to pull up and some assist PROM into flexion, use of the Tgun anterior and posterior knee Vaso high pressure 34 degrees high pressure   06/18/23 NuStep L5x48mins  Full revolutions on bike x3 mins with compensations PROM pushing into knee flexion and holding  RLE 5# LAQ hold 30s x3 Blue band HS curls 2x15 5# cable hip ext and abd  Leg press 40# 2x10  06/15/23 NuStep L5x74mins  Step ups 6" Lateral heel taps 6" Anterior heel taps 6"- stopped after 2 attempts STS 2x10 HS curls 35# 2x10, RLE 20# x10  Leg ext 10# 2x10, RLE 5# x10 Leg press 30# 2x10 RLE, ROM x10  Vaso high pressure 10 mins  06/10/23 PROM R knee flex/ext  NuStep L5x9mins Step ups forward and laterally Calf raises 2x12  Calf stretch 15s x2 Resisted gait 20# 3 way x5  Leg press 20# 2x10  Vaso high pressure  06/04/23 NuStep L5x45mins  Bike ROM partial revs x3 mins PROM to R knee flex/ext  LAQ 3# 2x10 HS curls green 2x10 STM with theragun to R low back  STS x10 Step overs 4" Anterior heel tap 4" Calf stretch 30s on slant  Vaso high pressure  06/01/23 Bike attempt- unable due to decreased ROM patellar mobs Rt knee ant/post mobs PROM Rt knee flex/ ext LAQ 3# 2x10- hold at TKE S2S 2x10 HS curl green band x10    Black bar heel raise 2x10 6in step up x10 each leg- cue to avoid circumduction 4in step down rt leg on box 2x10 Vaso high pressure    05/28/22 NuStep L4x82mins LE only Calf stretch 30s  Calf raises 2x10  PROM to R knee end range holds, patellar mobs  LAQ 3# 2x10 HS curls green 2x10 STS elevated table  2x10 Step ups 4"  SLR 2# 2x10 SAQ 2# 2x10 Vaso high pressure 35 degrees  05/25/23 Nustep level 3 x 6 minutes LE only Bike partial revs x 4 minutes some partial revs able to do a few full revs Leg press 20# x10 both legs, no weight right only, then 20# right only with small ROM PROM some joint mobs CAlf stretches Resisted gait with tband fwd and backward On airex weight shift and march Practiced walking with cues for step length and a natural bend to the knee, tends to keep knee stiff and circumduct the leg Vaso medium pressure 35 degrees  05/21/23 Nustep L4 PROM with end range hold  Joint knee mobs ant & post QS x10 SLR x10 Seated knee ext x10 Sit to stand 2x10 Gait emphasize Hip flexion and heel strike 67ft PF stretch 6in step up x6, too much compensation with circumduction, switched to 4in x10 2in step up on airex pad 2x10    05/18/23- EVAL, HEP    PATIENT EDUCATION:  Education details: POC, HEP, icing and elevating Person educated: Patient Education method: Explanation Education comprehension: verbalized understanding  HOME EXERCISE PROGRAM: Access Code: [email or web address] URL: [email or web address] Date: 05/18/2023  Prepared by: Donavon Fudge  Exercises - Supine Quad Set  - 1 x daily - 7 x weekly - 2 sets - 10 reps - Supine Heel Slide with Strap  - 1 x daily - 7 x weekly - 2 sets - 10 reps - Sit to Stand  - 1 x daily - 7 x weekly - 2 sets - 10 reps - Seated Long Arc Quad  - 1 x daily - 7 x weekly - 2 sets - 10 reps  ASSESSMENT:  CLINICAL IMPRESSION: Patient continues to have problems with his R knee, including pain and ROM deficits into flexion. He reports ongoing popping.  We continued to work to push for range and strength improvements. Will see Dr on 08/10/23.   Patient is a 58 y.o. male who was seen today for physical therapy evaluation and treatment for R knee pain s/p mensicus surgery on 4/1. His biggest limitation is knee flexion, he is also  missing some TKE. Patient reports low pain levels. He is walking with antalgic gait, no AD and has an ace wrap bandage around his incision. Has a follow up with his doctor 05/21/23. Patient will benefit from skilled PT to address his R knee deficits to return to PLOF.   OBJECTIVE IMPAIRMENTS: Abnormal gait, decreased mobility, difficulty walking, decreased ROM, decreased strength, and pain.   ACTIVITY LIMITATIONS: squatting, stairs, transfers, and locomotion level  PARTICIPATION LIMITATIONS: shopping, community activity, occupation, and yard work  PERSONAL FACTORS: Age and Fitness are also affecting patient's functional outcome.   REHAB POTENTIAL: Good  CLINICAL DECISION MAKING: Stable/uncomplicated  EVALUATION COMPLEXITY: Low  GOALS: Goals reviewed with patient? Yes   SHORT TERM GOALS: Target date: 06/29/23  Independent with initial HEP. Baseline: given 05/18/23 Goal status: met 06/24/23   2. Patient will demonstrate improved functional LE strength by completing 5x STS in <15 seconds. Baseline: 19.54s from elevated mat  Goal status:07/02/23, 13.65s MET 07/16/23   LONG TERM GOALS: Target date: 08/10/23  Independent with advanced/ongoing HEP to improve outcomes and carryover.  Baseline:  Goal status: evolving 07/02/23  2.  Donald Payne will demonstrate R knee flexion to 120 deg to ascend/descend stairs. Baseline: 86 w/pain Goal status: 07/02/23 progressing, 100d 07/09/23  3.  Donald Payne will demonstrate full R knee extension for safety with gait. Baseline: 15 Goal status: IN PROGRESS 10d 07/09/23  4.  Donald Payne will be able to ambulate at least 600' safely with LRAD and normal gait pattern to access community.  Baseline: antalgic gait Goal status: IN PROGRESS  5.  Donald Payne will be able to put his socks on and off.  Baseline: unable to do Goal status: MET 07/09/23    PLAN:  PT FREQUENCY: 2x/week  PT DURATION: 12 weeks  PLANNED INTERVENTIONS:  97110-Therapeutic exercises, 97530- Therapeutic activity, 97112- Neuromuscular re-education, 97535- Self Care, 16109- Manual therapy, [phone]- Gait training, [phone]- Vasopneumatic device, Patient/Family education, Balance training, Stair training, Taping, Dry Needling, Joint mobilization, and Cryotherapy  PLAN FOR NEXT SESSION: R knee strengthening, emphasize R knee flexion PROM, functional tasks- steps, STS, gait    Donavon Fudge, PT 07/21/2023, 6:02 PM [facility] [address] [place], [place], [address] Phone: [phone]   Fax:  [phone] [address] [place], [place], [address] Phone: [phone]   Fax:  [phone]  Patient Details  Name: Donald Payne MRN: [id number] Date of Birth:  12-30-1965 Referring Provider:  Wilhelmenia Harada, MD

## 2023-07-23 ENCOUNTER — Encounter (HOSPITAL_BASED_OUTPATIENT_CLINIC_OR_DEPARTMENT_OTHER): Admitting: Orthopaedic Surgery

## 2023-08-04 ENCOUNTER — Ambulatory Visit: Admitting: Physical Therapy

## 2023-08-04 ENCOUNTER — Encounter: Payer: Self-pay | Admitting: Physical Therapy

## 2023-08-04 DIAGNOSIS — M25561 Pain in right knee: Secondary | ICD-10-CM

## 2023-08-04 DIAGNOSIS — M25661 Stiffness of right knee, not elsewhere classified: Secondary | ICD-10-CM

## 2023-08-04 DIAGNOSIS — Z9889 Other specified postprocedural states: Secondary | ICD-10-CM

## 2023-08-04 NOTE — Therapy (Signed)
 OUTPATIENT PHYSICAL THERAPY LOWER EXTREMITY    Patient Name: Donald Payne MRN: [id number] DOB:August 10, 1965, 58 y.o., male Today's Date: 08/04/2023  END OF SESSION:  PT End of Session - 08/04/23 1712     Visit Number 15    Date for PT Re-Evaluation 08/10/23    Authorization Type [employer]    PT Start Time 1658    PT Stop Time 1742    PT Time Calculation (min) 44 min    Activity Tolerance Patient tolerated treatment well    Behavior During Therapy WFL for tasks assessed/performed                   Past Medical History:  Diagnosis Date   Allergic rhinitis    Allergy    Anxiety    Asthma    Depression    Diabetes mellitus (HCC)    Dyslipidemia    Fatigue    GERD (gastroesophageal reflux disease)    Headache    Hemorrhoids    Hyperlipidemia    Hypertension    Hypoxia    IBS (irritable bowel syndrome)    Low testosterone    Prostatitis    Sleep apnea    uses CPAP   Sleep apnea with hypersomnolence    Vitamin D deficiency    Past Surgical History:  Procedure Laterality Date   KNEE ARTHROSCOPY Right    KNEE ARTHROSCOPY WITH MENISCAL REPAIR Right 05/12/2023   Procedure: ARTHROSCOPY, KNEE, WITH MENISCUS REPAIR;  Surgeon: Genelle Standing, MD;  Location: [facility] OR;  Service: Orthopedics;  Laterality: Right;  RIGHT KNEE ARTHROSCOPY WITH MEDIAL MENISCUS REPAIR AND LYSIS OF ADHESIONS   WISDOM TOOTH EXTRACTION     Patient Active Problem List   Diagnosis Date Noted   Arthrofibrosis of knee joint, right 05/12/2023   Hypertensive heart disease 07/06/2018   Asthma 07/06/2018   Sleep apnea 07/06/2018   Morbid obesity (HCC) 07/06/2018   Dyspnea 10/27/2011    PCP: Alm Rav  REFERRING PROVIDER: Standing Genelle  REFERRING DIAG:  [phone] (ICD-10-CM) - Complex tear of medial meniscus of right knee as current injury, subsequent encounter    THERAPY DIAG:  S/P lateral meniscus repair of right knee  Stiffness of right knee, not elsewhere classified  Acute pain of  right knee  Rationale for Evaluation and Treatment: Rehabilitation  ONSET DATE: 05/12/23  SUBJECTIVE STATEMENT:  Reports that he was on vacation the past two weeks, he reports tried a bicycle and really hurt, he reports that he feels that he is about where he was prior to surgery, unhappy with where the knee is at    PERTINENT HISTORY: R arthroscopy with medial meniscal repair 05/12/23 DM, depression, HTN, anxiety, obesity   PAIN:  Are you having pain? Reports pain a 3/10 aches and pops can be up to 7/10 PRECAUTIONS: Knee  RED FLAGS: None   WEIGHT BEARING RESTRICTIONS: No  FALLS:  Has patient fallen in last 6 months? 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  DIAGNOSTIC FINDINGS: IMPRESSION: 1.  Intrasubstance degeneration within junction of the body and posterior horn of the medial meniscus and within the body of the lateral meniscus. No tear is seen extending through an articular surface of either meniscus. 2. Mild-to-moderate mucoid degeneration of the ACL (ACL cyst). No ACL tear is seen. 3. Mild tricompartmental cartilage degenerative changes. 4. Moderate joint effusion.   COGNITION: Overall cognitive status: Within functional limits for tasks assessed     SENSATION: WFL  EDEMA:  Swelling around R  MUSCLE LENGTH: Hamstrings: tightness in R hamstring  PALPATION: Pain with flexion PROM  LOWER EXTREMITY ROM:  Active ROM Right Eval Seated  Left eval AROM  Right  05/25/23 AROM   Right 08/04/23  Hip flexion      Hip extension      Hip abduction      Hip adduction      Hip internal rotation      Hip external rotation      Knee flexion 86 w/pain  95 95 P!  Knee extension 15   10 5   Ankle dorsiflexion      Ankle plantarflexion      Ankle inversion      Ankle eversion       (Blank rows = not tested)  LOWER EXTREMITY MMT:  MMT Right eval Left eval  Hip flexion 5   Hip extension    Hip abduction    Hip adduction    Hip internal rotation    Hip external rotation    Knee flexion 4+   Knee extension 5   Ankle dorsiflexion    Ankle plantarflexion    Ankle inversion    Ankle eversion     (Blank rows = not tested)   FUNCTIONAL TESTS:  5 times sit to stand: 19.54s from elevated mat table  Timed up and go (TUG): 12.78s antalgic,   08/04/23 TUG 11 seconds   GAIT: Distance walked: in clinic distances  Assistive device utilized: None Level of assistance: Modified independence Comments: antalgic gait, slowed speed, decreased knee flexion                                                                          TREATMENT DATE:  08/04/23 Joint distraction with movements, passive stretch of the right knee Bike seat #15 partial revs for about a minute and then full revs with some trunk lean and hip bow Tmill pushes Elliptical level 2 x 3 minutes Feet on ball K2C, rotation, bridges, isometric LEg press 20# , no weight working on flexion Use of the Tgun to see if we could decrease pain and soreness  07/21/23 Lateral distraction to knee  Bike L3 x67mins seat 15  Resisted gait 40# 4 way x4  2 way hip 10# 2x10 Leg press 40# 2x10, RLE 20# 2x10, x10 ROM no weight Lunge on to 6 step  Chair squats x10   07/16/23 5xSTS- 13.65s Elliptical L2 x49mins NuStep L5x70mins LE only  Treadmill pushes 20s x2  STS  with OHP blue ball Leg ext 5# 2x10 RLE  HS curls 20# 2x10 RLE  KT tape to R knee   07/14/23 NuStep L5 x73mins Bike L3 x73mins  Squats to 8 + 4 on top of  treadmill x10  Step up 6 Wall sit x10 Kneeling on half moon bolster  Leg press 30# 2x10 seat 7, 20# RLE x10 from seat 9   07/09/23 PROM to R knee Recheck goals Walk outdoors Bike L2 x58mins  Calf stretch Calf raises 2x10 Step ups 6  Leg ext 15# 2x10  HS curls 35# 2x10  07/02/23  Elliptcal 2 min fwd/ 2 min backward STS 20 inches 10 x Holding TM and kneeling down to ariex pad on TM 10 x each leg- heavy UE use even with cuing Kneeling on airex on mat with wt shifts and coming up tall 10 x Belt mobs to increase Flexion Passive flexion stretching ROM after   Act  103       Pass   109 Green tband HS curl 2 sets 10 4 inch step down 10 x the 6 in 10 x- with UE support Leg press 20# working on getting more and more flexion then did without weight working VASO High Pressure   06/24/23 Nustep level 5 x 5 minutes Bike partial revs then full going backwards Leg press 20# working on getting more and more flexion then did without weight working on flexion 10# straight arm pulls on airex Worked on him kneeling on the right knee with a large bolster and then with 3 airex pads trying to go lower, using bar to pull up and some assist PROM into flexion, use of the Tgun anterior and posterior knee Vaso high pressure 34 degrees high pressure   06/18/23 NuStep L5x73mins  Full revolutions on bike x3 mins with compensations PROM pushing into knee flexion and holding  RLE 5# LAQ hold 30s x3 Blue band HS curls 2x15 5# cable hip ext and abd  Leg press 40# 2x10  06/15/23 NuStep L5x25mins  Step ups 6 Lateral heel taps 6 Anterior heel taps 6- stopped after 2 attempts STS 2x10 HS curls 35# 2x10, RLE 20# x10  Leg ext 10# 2x10, RLE 5# x10 Leg press 30# 2x10 RLE, ROM x10  Vaso high pressure 10 mins  06/10/23 PROM R knee flex/ext  NuStep L5x54mins Step ups forward and laterally Calf raises 2x12  Calf stretch 15s x2 Resisted gait 20# 3 way x5  Leg press 20# 2x10  Vaso high pressure   06/04/23 NuStep L5x62mins  Bike ROM partial revs x3 mins PROM to R knee flex/ext  LAQ 3# 2x10 HS curls green 2x10 STM with theragun to R low back  STS x10 Step overs 4 Anterior heel tap 4 Calf stretch 30s on slant  Vaso high pressure  06/01/23 Bike attempt- unable due to decreased ROM patellar mobs Rt knee ant/post mobs PROM Rt knee flex/ ext LAQ 3# 2x10- hold at TKE S2S 2x10 HS curl green band x10    Black bar heel raise 2x10 6in step up x10 each leg- cue to avoid circumduction 4in step down rt leg on box 2x10 Vaso high pressure    05/28/22 NuStep L4x86mins LE only Calf stretch 30s  Calf raises 2x10  PROM to R knee end range holds, patellar mobs  LAQ 3# 2x10 HS curls green 2x10 STS elevated table 2x10 Step ups 4  SLR 2# 2x10 SAQ 2# 2x10 Vaso high pressure 35 degrees  05/25/23 Nustep level 3 x 6 minutes LE only Bike partial revs x 4 minutes some partial revs able to do a few full revs Leg press 20# x10 both legs, no weight right only, then 20# right only with small ROM PROM  some joint mobs CAlf stretches Resisted gait with tband fwd and backward On airex weight shift and march Practiced walking with cues for step length and a natural bend to the knee, tends to keep knee stiff and circumduct the leg Vaso medium pressure 35 degrees  05/21/23 Nustep L4 PROM with end range hold  Joint knee mobs ant & post QS x10 SLR x10 Seated knee ext x10 Sit to stand 2x10 Gait emphasize Hip flexion and heel strike 62ft PF stretch 6in step up x6, too much compensation with circumduction, switched to 4in x10 2in step up on airex pad 2x10    05/18/23- EVAL, HEP    PATIENT EDUCATION:  Education details: POC, HEP, icing and elevating Person educated: Patient Education method: Explanation Education comprehension: verbalized understanding  HOME EXERCISE PROGRAM: Access Code: [email or web address] URL: [email or web address] Date:  05/18/2023 Prepared by: Almetta Fam  Exercises - Supine Quad Set  - 1 x daily - 7 x weekly - 2 sets - 10 reps - Supine Heel Slide with Strap  - 1 x daily - 7 x weekly - 2 sets - 10 reps - Sit to Stand  - 1 x daily - 7 x weekly - 2 sets - 10 reps - Seated Long Arc Quad  - 1 x daily - 7 x weekly - 2 sets - 10 reps  ASSESSMENT:  CLINICAL IMPRESSION: Patient continues to have problems with his R knee, he was on vacation the past two weeks.  He reports very stiff and feels that he is about where he is is about where he was prior to surgery, he is frustrated.  Patient is a 58 y.o. male who was seen today for physical therapy evaluation and treatment for R knee pain s/p mensicus surgery on 4/1. His biggest limitation is knee flexion, he is also missing some TKE. Patient reports low pain levels. He is walking with antalgic gait, no AD and has an ace wrap bandage around his incision. Has a follow up with his doctor 05/21/23. Patient will benefit from skilled PT to address his R knee deficits to return to PLOF.   OBJECTIVE IMPAIRMENTS: Abnormal gait, decreased mobility, difficulty walking, decreased ROM, decreased strength, and pain.   ACTIVITY LIMITATIONS: squatting, stairs, transfers, and locomotion level  PARTICIPATION LIMITATIONS: shopping, community activity, occupation, and yard work  PERSONAL FACTORS: Age and Fitness are also affecting patient's functional outcome.   REHAB POTENTIAL: Good  CLINICAL DECISION MAKING: Stable/uncomplicated  EVALUATION COMPLEXITY: Low  GOALS: Goals reviewed with patient? Yes   SHORT TERM GOALS: Target date: 06/29/23  Independent with initial HEP. Baseline: given 05/18/23 Goal status: met 06/24/23   2. Patient will demonstrate improved functional LE strength by completing 5x STS in <15 seconds. Baseline: 19.54s from elevated mat  Goal status:07/02/23, 13.65s MET 07/16/23   LONG TERM GOALS: Target date: 08/10/23  Independent with advanced/ongoing HEP to  improve outcomes and carryover.  Baseline:  Goal status: evolving 07/02/23  2.  Donald Payne will demonstrate R knee flexion to 120 deg to ascend/descend stairs. Baseline: 86 w/pain Goal status: 07/02/23 progressing, 100d 07/09/23  3.  Donald Payne will demonstrate full R knee extension for safety with gait. Baseline: 15 Goal status: IN PROGRESS 10d 07/09/23  4.  Donald Payne will be able to ambulate at least 600' safely with LRAD and normal gait pattern to access community.  Baseline: antalgic gait Goal status: IN PROGRESS  5.  Donald Payne will be able to put his socks  on and off.  Baseline: unable to do Goal status: MET 07/09/23    PLAN:  PT FREQUENCY: 2x/week  PT DURATION: 12 weeks  PLANNED INTERVENTIONS: 97110-Therapeutic exercises, 97530- Therapeutic activity, 97112- Neuromuscular re-education, 97535- Self Care, [id number]- Manual therapy, [phone]- Gait training, [phone]- Vasopneumatic device, Patient/Family education, Balance training, Stair training, Taping, Dry Needling, Joint mobilization, and Cryotherapy  PLAN FOR NEXT SESSION: write MD note, he sees MD on 6/30   Donald Payne, PT 08/04/2023, 5:13 PM [facility] [address] [facility]. [place], [place], [address] Phone: [phone]   Fax:  [phone]  Patient Details  Name: Donald Payne MRN: [id number] Date of Birth: [birth year] Referring Provider:  Genelle Standing, MD

## 2023-08-06 ENCOUNTER — Ambulatory Visit

## 2023-08-06 NOTE — Therapy (Incomplete)
 OUTPATIENT PHYSICAL THERAPY LOWER EXTREMITY    Patient Name: Donald Payne MRN: [id number] DOB:[birth year], 58 y.o., male Today's Date: 08/06/2023  END OF SESSION:             Past Medical History:  Diagnosis Date   Allergic rhinitis    Allergy    Anxiety    Asthma    Depression    Diabetes mellitus (HCC)    Dyslipidemia    Fatigue    GERD (gastroesophageal reflux disease)    Headache    Hemorrhoids    Hyperlipidemia    Hypertension    Hypoxia    IBS (irritable bowel syndrome)    Low testosterone    Prostatitis    Sleep apnea    uses CPAP   Sleep apnea with hypersomnolence    Vitamin D deficiency    Past Surgical History:  Procedure Laterality Date   KNEE ARTHROSCOPY Right    KNEE ARTHROSCOPY WITH MENISCAL REPAIR Right 05/12/2023   Procedure: ARTHROSCOPY, KNEE, WITH MENISCUS REPAIR;  Surgeon: Genelle Standing, MD;  Location: [facility] OR;  Service: Orthopedics;  Laterality: Right;  RIGHT KNEE ARTHROSCOPY WITH MEDIAL MENISCUS REPAIR AND LYSIS OF ADHESIONS   WISDOM TOOTH EXTRACTION     Patient Active Problem List   Diagnosis Date Noted   Arthrofibrosis of knee joint, right 05/12/2023   Hypertensive heart disease 07/06/2018   Asthma 07/06/2018   Sleep apnea 07/06/2018   Morbid obesity (HCC) 07/06/2018   Dyspnea 10/27/2011    PCP: Alm Rav  REFERRING PROVIDER: Standing Genelle  REFERRING DIAG:  [phone] (ICD-10-CM) - Complex tear of medial meniscus of right knee as current injury, subsequent encounter    THERAPY DIAG:  No diagnosis found.  Rationale for Evaluation and Treatment: Rehabilitation  ONSET DATE: 05/12/23  SUBJECTIVE STATEMENT:  Reports that he was on vacation the past two weeks, he reports tried a bicycle and really hurt, he reports that he feels that he is about where he was prior to surgery, unhappy with where the knee is at    PERTINENT HISTORY: R arthroscopy with medial meniscal repair 05/12/23 DM, depression, HTN, anxiety, obesity    PAIN:  Are you having pain? Reports pain a 3/10 aches and pops can be up to 7/10 PRECAUTIONS: Knee  RED FLAGS: None   WEIGHT BEARING RESTRICTIONS: No  FALLS:  Has patient fallen in last 6 months? 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  DIAGNOSTIC FINDINGS: IMPRESSION: 1. Intrasubstance degeneration within junction of the body and posterior horn of the medial meniscus and within the body of the lateral meniscus. No tear is seen extending through an articular surface of either meniscus. 2. Mild-to-moderate mucoid degeneration of the ACL (ACL cyst). No ACL tear is seen. 3. Mild tricompartmental cartilage degenerative changes. 4. Moderate joint effusion.   COGNITION: Overall cognitive status: Within functional limits for tasks assessed     SENSATION: WFL  EDEMA:  Swelling around R  MUSCLE LENGTH: Hamstrings: tightness in R hamstring  PALPATION: Pain with flexion  PROM  LOWER EXTREMITY ROM:  Active ROM Right Eval Seated  Left eval AROM  Right  05/25/23 AROM  Right 08/04/23  Hip flexion      Hip extension      Hip abduction      Hip adduction      Hip internal rotation      Hip external rotation      Knee flexion 86 w/pain  95 95 P!  Knee extension 15   10 5   Ankle dorsiflexion      Ankle plantarflexion      Ankle inversion      Ankle eversion       (Blank rows = not tested)  LOWER EXTREMITY MMT:  MMT Right eval Left eval  Hip flexion 5   Hip extension    Hip abduction    Hip  adduction    Hip internal rotation    Hip external rotation    Knee flexion 4+   Knee extension 5   Ankle dorsiflexion    Ankle plantarflexion    Ankle inversion    Ankle eversion     (Blank rows = not tested)   FUNCTIONAL TESTS:  5 times sit to stand: 19.54s from elevated mat table  Timed up and go (TUG): 12.78s antalgic,   08/04/23 TUG 11 seconds   GAIT: Distance walked: in clinic distances  Assistive device utilized: None Level of assistance: Modified independence Comments: antalgic gait, slowed speed, decreased knee flexion                                                                          TREATMENT DATE:  08/06/23 NuStep Bike Leg ext single leg HS curls single leg  PROM to R knee and lateral distraction  Leg press no weight working on ROM 2x10 Step ups 6 STS  08/04/23 Joint distraction with movements, passive stretch of the right knee Bike seat #15 partial revs for about a minute and then full revs with some trunk lean and hip bow Tmill pushes Elliptical level 2 x 3 minutes Feet on ball K2C, rotation, bridges, isometric LEg press 20# , no weight working on flexion Use of the Tgun to see if we could decrease pain and soreness  07/21/23 Lateral distraction to knee  Bike L3 x63mins seat 15  Resisted gait 40# 4 way x4  2 way hip 10# 2x10 Leg press 40# 2x10, RLE 20# 2x10, x10 ROM no weight Lunge on to 6 step  Chair squats x10   07/16/23 5xSTS- 13.65s Elliptical L2 x15mins NuStep L5x69mins LE only  Treadmill pushes 20s x2  STS  with OHP blue ball Leg ext 5# 2x10 RLE  HS curls 20# 2x10 RLE  KT tape to R knee   07/14/23 NuStep L5 x65mins Bike L3 x46mins  Squats to 8 + 4 on top of treadmill x10  Step up 6 Wall sit x10 Kneeling on half moon bolster  Leg press 30# 2x10 seat 7, 20# RLE x10 from seat 9   07/09/23 PROM to R knee Recheck goals Walk outdoors Bike L2 x32mins  Calf stretch Calf raises 2x10 Step ups 6  Leg ext 15# 2x10  HS curls 35#  2x10  07/02/23  Elliptcal  2 min fwd/ 2 min backward STS 20 inches 10 x Holding TM and kneeling down to ariex pad on TM 10 x each leg- heavy UE use even with cuing Kneeling on airex on mat with wt shifts and coming up tall 10 x Belt mobs to increase Flexion Passive flexion stretching ROM after   Act  103       Pass   109 Green tband HS curl 2 sets 10 4 inch step down 10 x the 6 in 10 x- with UE support Leg press 20# working on getting more and more flexion then did without weight working VASO High Pressure   06/24/23 Nustep level 5 x 5 minutes Bike partial revs then full going backwards Leg press 20# working on getting more and more flexion then did without weight working on flexion 10# straight arm pulls on airex Worked on him kneeling on the right knee with a large bolster and then with 3 airex pads trying to go lower, using bar to pull up and some assist PROM into flexion, use of the Tgun anterior and posterior knee Vaso high pressure 34 degrees high pressure   06/18/23 NuStep L5x26mins  Full revolutions on bike x3 mins with compensations PROM pushing into knee flexion and holding  RLE 5# LAQ hold 30s x3 Blue band HS curls 2x15 5# cable hip ext and abd  Leg press 40# 2x10  06/15/23 NuStep L5x76mins  Step ups 6 Lateral heel taps 6 Anterior heel taps 6- stopped after 2 attempts STS 2x10 HS curls 35# 2x10, RLE 20# x10  Leg ext 10# 2x10, RLE 5# x10 Leg press 30# 2x10 RLE, ROM x10  Vaso high pressure 10 mins  06/10/23 PROM R knee flex/ext  NuStep L5x68mins Step ups forward and laterally Calf raises 2x12  Calf stretch 15s x2 Resisted gait 20# 3 way x5  Leg press 20# 2x10  Vaso high pressure  06/04/23 NuStep L5x26mins  Bike ROM partial revs x3 mins PROM to R knee flex/ext  LAQ 3# 2x10 HS curls green 2x10 STM with theragun to R low back  STS x10 Step overs 4 Anterior heel tap 4 Calf stretch 30s on slant  Vaso high pressure  06/01/23 Bike attempt-  unable due to decreased ROM patellar mobs Rt knee ant/post mobs PROM Rt knee flex/ ext LAQ 3# 2x10- hold at TKE S2S 2x10 HS curl green band x10    Black bar heel raise 2x10 6in step up x10 each leg- cue to avoid circumduction 4in step down rt leg on box 2x10 Vaso high pressure    05/28/22 NuStep L4x48mins LE only Calf stretch 30s  Calf raises 2x10  PROM to R knee end range holds, patellar mobs  LAQ 3# 2x10 HS curls green 2x10 STS elevated table 2x10 Step ups 4  SLR 2# 2x10 SAQ 2# 2x10 Vaso high pressure 35 degrees  05/25/23 Nustep level 3 x 6 minutes LE only Bike partial revs x 4 minutes some partial revs able to do a few full revs Leg press 20# x10 both legs, no weight right only, then 20# right only with small ROM PROM some joint mobs CAlf stretches Resisted gait with tband fwd and backward On airex weight shift and march Practiced walking with cues for step length and a natural bend to the knee, tends to keep knee stiff and circumduct the leg Vaso medium pressure 35 degrees  05/21/23 Nustep L4 PROM with end range hold  Joint knee mobs ant &  post QS x10 SLR x10 Seated knee ext x10 Sit to stand 2x10 Gait emphasize Hip flexion and heel strike 89ft PF stretch 6in step up x6, too much compensation with circumduction, switched to 4in x10 2in step up on airex pad 2x10    05/18/23- EVAL, HEP    PATIENT EDUCATION:  Education details: POC, HEP, icing and elevating Person educated: Patient Education method: Explanation Education comprehension: verbalized understanding  HOME EXERCISE PROGRAM: Access Code: [email or web address] URL: [email or web address] Date: 05/18/2023 Prepared by: Almetta Fam  Exercises - Supine Quad Set  - 1 x daily - 7 x weekly - 2 sets - 10 reps - Supine Heel Slide with Strap  - 1 x daily - 7 x weekly - 2 sets - 10 reps - Sit to Stand  - 1 x daily - 7 x weekly - 2 sets - 10 reps - Seated Long Arc Quad  - 1 x daily - 7 x  weekly - 2 sets - 10 reps  ASSESSMENT:  CLINICAL IMPRESSION: Patient continues to have problems with his R knee, he was on vacation the past two weeks.  He reports very stiff and feels that he is about where he is is about where he was prior to surgery, he is frustrated.  Patient is a 58 y.o. male who was seen today for physical therapy evaluation and treatment for R knee pain s/p mensicus surgery on 4/1. His biggest limitation is knee flexion, he is also missing some TKE. Patient reports low pain levels. He is walking with antalgic gait, no AD and has an ace wrap bandage around his incision. Has a follow up with his doctor 05/21/23. Patient will benefit from skilled PT to address his R knee deficits to return to PLOF.   OBJECTIVE IMPAIRMENTS: Abnormal gait, decreased mobility, difficulty walking, decreased ROM, decreased strength, and pain.   ACTIVITY LIMITATIONS: squatting, stairs, transfers, and locomotion level  PARTICIPATION LIMITATIONS: shopping, community activity, occupation, and yard work  PERSONAL FACTORS: Age and Fitness are also affecting patient's functional outcome.   REHAB POTENTIAL: Good  CLINICAL DECISION MAKING: Stable/uncomplicated  EVALUATION COMPLEXITY: Low  GOALS: Goals reviewed with patient? Yes   SHORT TERM GOALS: Target date: 06/29/23  Independent with initial HEP. Baseline: given 05/18/23 Goal status: met 06/24/23   2. Patient will demonstrate improved functional LE strength by completing 5x STS in <15 seconds. Baseline: 19.54s from elevated mat  Goal status:07/02/23, 13.65s MET 07/16/23   LONG TERM GOALS: Target date: 08/10/23  Independent with advanced/ongoing HEP to improve outcomes and carryover.  Baseline:  Goal status: evolving 07/02/23  2.  NYAL SCHACHTER will demonstrate R knee flexion to 120 deg to ascend/descend stairs. Baseline: 86 w/pain Goal status: 07/02/23 progressing, 100d 07/09/23  3.  TRAVONE GEORG will demonstrate full R knee  extension for safety with gait. Baseline: 15 Goal status: IN PROGRESS 10d 07/09/23  4.  JOHNNEY SCARLATA will be able to ambulate at least 600' safely with LRAD and normal gait pattern to access community.  Baseline: antalgic gait Goal status: IN PROGRESS  5.  MITCH ARQUETTE will be able to put his socks on and off.  Baseline: unable to do Goal status: MET 07/09/23    PLAN:  PT FREQUENCY: 2x/week  PT DURATION: 12 weeks  PLANNED INTERVENTIONS: 97110-Therapeutic exercises, 97530- Therapeutic activity, 97112- Neuromuscular re-education, 97535- Self Care, 02859- Manual therapy, [phone]- Gait training, [phone]- Vasopneumatic device, Patient/Family education, Balance training, Stair training, Taping, Dry Needling, Joint mobilization, and Cryotherapy  PLAN FOR NEXT SESSION: write MD note, he sees MD on 6/30   Almetta Fam, PT 08/06/2023, 10:15 AM [facility] [address] [facility]. [place], [place], [address] Phone: [phone]   Fax:  [phone]  Patient Details  Name: FINLEE MILO MRN: [id number] Date of Birth: 1965-11-28 Referring Provider:  Genelle Standing, MD

## 2023-08-10 ENCOUNTER — Ambulatory Visit (INDEPENDENT_AMBULATORY_CARE_PROVIDER_SITE_OTHER): Admitting: Orthopaedic Surgery

## 2023-08-10 DIAGNOSIS — S83231D Complex tear of medial meniscus, current injury, right knee, subsequent encounter: Secondary | ICD-10-CM

## 2023-08-10 NOTE — Progress Notes (Signed)
 Post Operative Evaluation    Procedure/Date of Surgery: Right knee lysis of adhesions 4/1  Interval History:   Presents today 12-week status post right knee arthroscopic lysis of adhesions.  Overall he is frustrated with lack of progress at today's visit.  Range of motion has returned back to approximately 90 degrees although I do believe he still has pliable scar tissue as he is able to get further in a deep bend   PMH/PSH/Family History/Social History/Meds/Allergies:    Past Medical History:  Diagnosis Date   Allergic rhinitis    Allergy    Anxiety    Asthma    Depression    Diabetes mellitus (HCC)    Dyslipidemia    Fatigue    GERD (gastroesophageal reflux disease)    Headache    Hemorrhoids    Hyperlipidemia    Hypertension    Hypoxia    IBS (irritable bowel syndrome)    Low testosterone    Prostatitis    Sleep apnea    uses CPAP   Sleep apnea with hypersomnolence    Vitamin D deficiency    Past Surgical History:  Procedure Laterality Date   KNEE ARTHROSCOPY Right    KNEE ARTHROSCOPY WITH MENISCAL REPAIR Right 05/12/2023   Procedure: ARTHROSCOPY, KNEE, WITH MENISCUS REPAIR;  Surgeon: Genelle Standing, MD;  Location: [facility] OR;  Service: Orthopedics;  Laterality: Right;  RIGHT KNEE ARTHROSCOPY WITH MEDIAL MENISCUS REPAIR AND LYSIS OF ADHESIONS   WISDOM TOOTH EXTRACTION     Social History   Socioeconomic History   Marital status: Married    Spouse name: [name]   Number of children: 0   Years of education: Not on file   Highest education level: Master's degree (e.g., MA, MS, MEng, MEd, MSW, MBA)  Occupational History   Occupation: Field seismologist  Tobacco Use   Smoking status: Never   Smokeless tobacco: Never  Vaping Use   Vaping status: Never Used  Substance and Sexual Activity   Alcohol use: Not Currently   Drug use: No   Sexual activity: Not on file  Other Topics Concern   Not on file  Social History Narrative   Lives  with wife   Caffeine- 3-4 diet sodas a day   Social Drivers of Corporate investment banker Strain: Not on file  Food Insecurity: Not on file  Transportation Needs: Not on file  Physical Activity: Not on file  Stress: Not on file  Social Connections: Not on file   Family History  Problem Relation Age of Onset   Migraines Mother    Colon cancer Mother    Hypertension Mother    Diabetes Mother    Heart disease Father    Diabetes Paternal Grandmother    Heart disease Paternal Grandmother    Prostate cancer Paternal Grandfather    Healthy Other    Coronary artery disease Other        Deceased age 75   Esophageal cancer Neg Hx    Rectal cancer Neg Hx    Stomach cancer Neg Hx    Allergies  Allergen Reactions   Atorvastatin Other (See Comments)    Muscle soreness  Muscle soreness  Muscle soreness  Muscle soreness, Muscle soreness   Budesonide-Formoterol Fumarate Other (See Comments)    Tongue/throat soreness   Metformin Hcl Diarrhea  Other reaction(s): Diarhea   Current Outpatient Medications  Medication Sig Dispense Refill   albuterol  (PROVENTIL ) (2.5 MG/3ML) 0.083% nebulizer solution Take 2.5 mg by nebulization 4 (four) times daily as needed for wheezing or shortness of breath.     albuterol  (VENTOLIN  HFA) 108 (90 Base) MCG/ACT inhaler Can inhale two puffs every four to six hours as needed for cough or wheeze. 18 g 0   ALPRAZolam  (XANAX ) 0.5 MG tablet Take 1 tablet (0.5 mg total) by mouth at bedtime as needed for anxiety. 30 tablet 2   aspirin  EC 325 MG tablet Take 1 tablet (325 mg total) by mouth daily. 30 tablet 0   azelastine  (ASTELIN ) 0.1 % nasal spray Can use one to two sprays in each nostril one to two times daily as directed. (Patient not taking: Reported on 05/06/2023) 30 mL 5   busPIRone  (BUSPAR ) 10 MG tablet Take 2 tablets (20 mg total) by mouth 2 (two) times daily. TAKE 2 TABLETS BY MOUTH 2 TIMES DAILY. 360 tablet 1   celecoxib  (CELEBREX ) 200 MG capsule TAKE 1  CAPSULE (200 MG TOTAL) BY MOUTH 2 (TWO) TIMES DAILY BETWEEN MEALS AS NEEDED. (Patient not taking: Reported on 05/06/2023) 60 capsule 1   doxepin  (SINEQUAN ) 10 MG capsule Take 1 capsule (10 mg total) by mouth at bedtime. 30 capsule 2   Erenumab -aooe (AIMOVIG ) 70 MG/ML SOAJ INJECT 70 MG INTO THE SKIN EVERY 30 DAYS (Patient not taking: Reported on 05/06/2023) 1 mL 1   famotidine  (PEPCID ) 40 MG tablet TAKE ONE TABLET ONCE EACH EVENING 30 tablet 0   fenofibrate micronized (ANTARA) 43 MG capsule Take 43 mg by mouth daily.     gabapentin  (NEURONTIN ) 300 MG capsule TAKE 2 CAPSULES BY MOUTH 2 TIMES DAILY. 120 capsule 3   HUMALOG 100 UNIT/ML injection Inject 0-200 Units into the skin See admin instructions. Used with Omnipod, fill with 200 units every 3 days     HYDROcodone -acetaminophen  (NORCO) 7.5-325 MG tablet Take 1 tablet by mouth every 6 (six) hours as needed for moderate pain. (Patient not taking: Reported on 05/06/2023) 30 tablet 0   JARDIANCE 25 MG TABS tablet Take 25 mg by mouth daily.     L-Methylfolate 15 MG TABS Take 15 mg by mouth daily.     lisinopril-hydrochlorothiazide (ZESTORETIC) 20-12.5 MG tablet Take 1 tablet by mouth daily.     MOUNJARO 10 MG/0.5ML Pen Inject 10 mg into the skin once a week.     oxyCODONE  (ROXICODONE ) 5 MG immediate release tablet Take 1 tablet (5 mg total) by mouth every 4 (four) hours as needed for severe pain (pain score 7-10) or breakthrough pain. 10 tablet 0   pantoprazole  (PROTONIX ) 40 MG tablet TAKE TWO TABLETS EVERY MORNING AS DIRECTED. 60 tablet 0   pramipexole  (MIRAPEX ) 1.5 MG tablet Take 1 tablet (1.5 mg total) by mouth daily. 45 tablet 2   predniSONE  (DELTASONE ) 20 MG tablet Take 1 tablet (20 mg total) by mouth daily with breakfast. (Patient not taking: Reported on 05/06/2023) 6 tablet 0   rizatriptan (MAXALT) 10 MG tablet Take 10 mg by mouth as needed for migraine.     simvastatin (ZOCOR) 40 MG tablet Take 40 mg by mouth every evening.     SUMAtriptan  (IMITREX )  100 MG tablet Take 1 tablet (100 mg total) by mouth every 2 (two) hours as needed for migraine. May repeat in 2 hours if headache persists or recurs. (Patient not taking: Reported on 05/06/2023) 10 tablet 2   SYMBICORT  160-4.5  MCG/ACT inhaler INHALE TWO PUFFS TWICE DAILY TO PREVENT COUGH OR WHEEZE. RINSE MOUTH AFTER USE. (Patient not taking: Reported on 05/06/2023) 10.2 each 0   TRINTELLIX  20 MG TABS tablet Take 1 tablet (20 mg total) by mouth daily. 30 tablet 2   VITAMIN D PO Take 5,000 Units by mouth daily.     No current facility-administered medications for this visit.   No results found.  Review of Systems:   A ROS was performed including pertinent positives and negatives as documented in the HPI.   Musculoskeletal Exam:    There were no vitals taken for this visit.  Right knee incisions are well-appearing without erythema or drainage.  Range of motion is from 0 to 100 degrees in a squatted position.  Distal neurosensory exam is intact  Imaging:      I personally reviewed and interpreted the radiographs.   Assessment:   12-week status post right knee arthroscopic lysis of adhesions with some frustration today.  I have advised that he continue to work on deep squatting and stretching of the knee as I do believe that he does have pliable scar tissue which she can continue to work for.  To that effect I did recommend an ultrasound-guided injection with Zilretta today.  He understands this and would like to proceed.  Plan :    - Return to clinic 6 weeks for likely final check      I personally saw and evaluated the patient, and participated in the management and treatment plan.  Elspeth Parker, MD Attending Physician, Orthopedic Surgery  This document was dictated using Dragon voice recognition software. A reasonable attempt at proof reading has been made to minimize errors.

## 2023-08-25 ENCOUNTER — Telehealth (HOSPITAL_BASED_OUTPATIENT_CLINIC_OR_DEPARTMENT_OTHER): Payer: Self-pay | Admitting: Orthopaedic Surgery

## 2023-08-25 ENCOUNTER — Other Ambulatory Visit (HOSPITAL_BASED_OUTPATIENT_CLINIC_OR_DEPARTMENT_OTHER): Payer: Self-pay | Admitting: Orthopaedic Surgery

## 2023-08-25 DIAGNOSIS — S83231D Complex tear of medial meniscus, current injury, right knee, subsequent encounter: Secondary | ICD-10-CM

## 2023-08-25 NOTE — Telephone Encounter (Signed)
 Patient wants to know if he can have more PT until he comes back to Dr B if so he goes to [facility].

## 2023-08-25 NOTE — Telephone Encounter (Signed)
 Verbal order from Dr Genelle, PT order placed

## 2023-08-31 ENCOUNTER — Ambulatory Visit: Attending: Orthopaedic Surgery

## 2023-08-31 DIAGNOSIS — M25561 Pain in right knee: Secondary | ICD-10-CM | POA: Insufficient documentation

## 2023-08-31 DIAGNOSIS — S83231D Complex tear of medial meniscus, current injury, right knee, subsequent encounter: Secondary | ICD-10-CM | POA: Diagnosis present

## 2023-08-31 DIAGNOSIS — M25661 Stiffness of right knee, not elsewhere classified: Secondary | ICD-10-CM | POA: Insufficient documentation

## 2023-08-31 DIAGNOSIS — Z9889 Other specified postprocedural states: Secondary | ICD-10-CM | POA: Insufficient documentation

## 2023-08-31 NOTE — Therapy (Signed)
 OUTPATIENT PHYSICAL THERAPY LOWER EXTREMITY    Patient Name: Donald Payne MRN: [id number] DOB:20-Nov-1965, 58 y.o., male Today's Date: 08/31/2023  END OF SESSION:  PT End of Session - 08/31/23 1650     Visit Number 16    Date for PT Re-Evaluation 11/09/23    Authorization Type [employer]    PT Start Time 1650    PT Stop Time 1735    PT Time Calculation (min) 45 min    Activity Tolerance Patient tolerated treatment well    Behavior During Therapy WFL for tasks assessed/performed                    Past Medical History:  Diagnosis Date   Allergic rhinitis    Allergy    Anxiety    Asthma    Depression    Diabetes mellitus (HCC)    Dyslipidemia    Fatigue    GERD (gastroesophageal reflux disease)    Headache    Hemorrhoids    Hyperlipidemia    Hypertension    Hypoxia    IBS (irritable bowel syndrome)    Low testosterone    Prostatitis    Sleep apnea    uses CPAP   Sleep apnea with hypersomnolence    Vitamin D deficiency    Past Surgical History:  Procedure Laterality Date   KNEE ARTHROSCOPY Right    KNEE ARTHROSCOPY WITH MENISCAL REPAIR Right 05/12/2023   Procedure: ARTHROSCOPY, KNEE, WITH MENISCUS REPAIR;  Surgeon: Genelle Standing, MD;  Location: [facility] OR;  Service: Orthopedics;  Laterality: Right;  RIGHT KNEE ARTHROSCOPY WITH MEDIAL MENISCUS REPAIR AND LYSIS OF ADHESIONS   WISDOM TOOTH EXTRACTION     Patient Active Problem List   Diagnosis Date Noted   Arthrofibrosis of knee joint, right 05/12/2023   Hypertensive heart disease 07/06/2018   Asthma 07/06/2018   Sleep apnea 07/06/2018   Morbid obesity (HCC) 07/06/2018   Dyspnea 10/27/2011    PCP: Alm Rav  REFERRING PROVIDER: Standing Genelle  REFERRING DIAG:  [phone] (ICD-10-CM) - Complex tear of medial meniscus of right knee as current injury, subsequent encounter    THERAPY DIAG:  Complex tear of medial meniscus of right knee as current injury, subsequent encounter  S/P lateral meniscus  repair of right knee  Stiffness of right knee, not elsewhere classified  Acute pain of right knee  Rationale for Evaluation and Treatment: Rehabilitation  ONSET DATE: 05/12/23  SUBJECTIVE STATEMENT:  I got a shot but still having pain. He told me to keep trying PT and to do squats, doctor thinks I have pliable tissue to get more range but also said I have scar tissue.     PERTINENT HISTORY: R arthroscopy with medial meniscal repair 05/12/23 DM, depression, HTN, anxiety, obesity   PAIN:  Are you having pain? Reports pain a 3/10 aches and pops can be up to 7/10 PRECAUTIONS: Knee  RED FLAGS: None   WEIGHT BEARING RESTRICTIONS: No  FALLS:  Has patient fallen in last 6 months? 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  DIAGNOSTIC FINDINGS: IMPRESSION: 1. Intrasubstance degeneration within junction of the body and posterior horn of the medial meniscus and within the body of the lateral meniscus. No tear is seen extending through an articular surface of either meniscus. 2. Mild-to-moderate mucoid degeneration of the ACL (ACL cyst). No ACL tear is seen. 3. Mild tricompartmental cartilage degenerative changes. 4. Moderate joint effusion.   COGNITION: Overall cognitive status: Within functional limits for tasks assessed     SENSATION: WFL  EDEMA:  Swelling around R  MUSCLE LENGTH: Hamstrings: tightness in R hamstring  PALPATION: Pain with flexion PROM  LOWER EXTREMITY ROM:  Active ROM  Right Eval Seated  Left eval AROM  Right  05/25/23 AROM  Right 08/04/23  Hip flexion      Hip extension      Hip abduction      Hip adduction      Hip internal rotation      Hip external rotation      Knee flexion 86 w/pain  95 95 P!  Knee extension 15   10 5   Ankle dorsiflexion      Ankle plantarflexion      Ankle inversion      Ankle eversion       (Blank rows = not tested)  LOWER EXTREMITY MMT:  MMT Right eval Left eval  Hip flexion 5   Hip extension    Hip abduction    Hip adduction    Hip internal rotation    Hip external rotation    Knee flexion 4+   Knee extension 5   Ankle dorsiflexion    Ankle plantarflexion    Ankle inversion    Ankle eversion     (Blank rows = not tested)   FUNCTIONAL TESTS:  5 times sit to stand: 19.54s from elevated mat table  Timed up and go (TUG): 12.78s antalgic,   08/04/23 TUG 11 seconds   GAIT: Distance walked: in clinic distances  Assistive device utilized: None Level of assistance: Modified independence Comments: antalgic gait, slowed speed, decreased knee flexion                                                                          TREATMENT DATE:  08/31/23 Recheck goals and ROM 95 flexion 10 extension NuStep L5x19mins LE only  Bike L3 x40mins Leg ext single leg 5# 2x10 HS curls single leg 20# 2x10 Leg press no weight working on ROM 2x10- able to get to seat 6 at best  PROM to R knee and lateral distraction  STS 2x10  08/04/23 Joint distraction with movements, passive stretch of the right knee Bike seat #15 partial revs for about a minute and then full revs with some trunk lean and hip bow Tmill pushes Elliptical level 2 x 3 minutes Feet on ball K2C, rotation, bridges, isometric LEg press 20# , no weight working on flexion Use of the Tgun to see if we could decrease pain and soreness  07/21/23 Lateral distraction to knee  Bike L3 x29mins seat 15  Resisted gait 40# 4 way x4  2 way hip 10# 2x10 Leg press 40#  2x10, RLE 20# 2x10, x10 ROM no weight Lunge  on to 6 step  Chair squats x10 PATIENT EDUCATION:  Education details: POC, HEP, icing and elevating Person educated: Patient Education method: Explanation Education comprehension: verbalized understanding  HOME EXERCISE PROGRAM: Access Code: [email or web address] URL: [email or web address] Date: 05/18/2023 Prepared by: Almetta Fam  Exercises - Supine Quad Set  - 1 x daily - 7 x weekly - 2 sets - 10 reps - Supine Heel Slide with Strap  - 1 x daily - 7 x weekly - 2 sets - 10 reps - Sit to Stand  - 1 x daily - 7 x weekly - 2 sets - 10 reps - Seated Long Arc Quad  - 1 x daily - 7 x weekly - 2 sets - 10 reps  ASSESSMENT:  CLINICAL IMPRESSION: Patient continues to have problems with his R knee, last visit was on 08/04/23. He returned to the surgeon and they sent a new referral for continuation of care to get more range and work on the pain. His doctor said he has scar tissue built up and will not go in and clean it out because it will just create more scar tissue. Pt has not made any progress with ROM. He reports some popping and the knee still feels tight. Passive ROM is 112 so the range is available. Reports it is not hurting and feels looser at end of visit.    Patient is a 58 y.o. male who was seen today for physical therapy evaluation and treatment for R knee pain s/p mensicus surgery on 4/1. His biggest limitation is knee flexion, he is also missing some TKE. Patient reports low pain levels. He is walking with antalgic gait, no AD and has an ace wrap bandage around his incision. Has a follow up with his doctor 05/21/23. Patient will benefit from skilled PT to address his R knee deficits to return to PLOF.   OBJECTIVE IMPAIRMENTS: Abnormal gait, decreased mobility, difficulty walking, decreased ROM, decreased strength, and pain.   ACTIVITY LIMITATIONS: squatting, stairs, transfers, and locomotion level  PARTICIPATION LIMITATIONS: shopping,  community activity, occupation, and yard work  PERSONAL FACTORS: Age and Fitness are also affecting patient's functional outcome.   REHAB POTENTIAL: Good  CLINICAL DECISION MAKING: Stable/uncomplicated  EVALUATION COMPLEXITY: Low  GOALS: Goals reviewed with patient? Yes   SHORT TERM GOALS: Target date: 06/29/23  Independent with initial HEP. Baseline: given 05/18/23 Goal status: met 06/24/23   2. Patient will demonstrate improved functional LE strength by completing 5x STS in <15 seconds. Baseline: 19.54s from elevated mat  Goal status:07/02/23, 13.65s MET 07/16/23   LONG TERM GOALS: Target date: 11/09/23  Independent with advanced/ongoing HEP to improve outcomes and carryover.  Baseline:  Goal status: ongoing   2.  AHMON TOSI will demonstrate R knee flexion to 110 deg to ascend/descend stairs. Baseline: 86 w/pain Goal status: 07/02/23 progressing, 100d 07/09/23, 95d 08/31/23  3.  ARNET HOFFERBER will demonstrate full R knee extension for safety with gait. Baseline: 15 Goal status: IN PROGRESS 10d 07/09/23, 10d 08/31/23  4.  ZYQUAN CROTTY will be able to ambulate at least 600' safely with LRAD and normal gait pattern to access community.  Baseline: antalgic gait Goal status: IN PROGRESS still slight limp 08/31/23  5.  BURT PIATEK will be able to put his socks on and off.  Baseline: unable to do Goal status: MET 07/09/23    PLAN:  PT FREQUENCY: 2x/week  PT DURATION: 12 weeks  PLANNED INTERVENTIONS: 97110-Therapeutic exercises, 97530- Therapeutic activity, 97112- Neuromuscular re-education,  02464- Self Care, 02859- Manual therapy, [phone]- Gait training, [phone]- Vasopneumatic device, Patient/Family education, Balance training, Stair training, Taping, Dry Needling, Joint mobilization, and Cryotherapy  PLAN FOR NEXT SESSION: keep working on ROM and pain   Almetta Fam, PT 08/31/2023, 5:30 PM [place] [facility]. [place], [place], [address] Phone: [phone]   Fax:  [phone]  Patient Details  Name: KENNER LEWAN MRN: [id number] Date of Birth: [birth year] Referring Provider:  Genelle Standing, MD

## 2023-09-10 ENCOUNTER — Ambulatory Visit: Admitting: Physical Therapy

## 2023-09-10 DIAGNOSIS — S83231D Complex tear of medial meniscus, current injury, right knee, subsequent encounter: Secondary | ICD-10-CM

## 2023-09-10 DIAGNOSIS — Z9889 Other specified postprocedural states: Secondary | ICD-10-CM

## 2023-09-10 DIAGNOSIS — M25661 Stiffness of right knee, not elsewhere classified: Secondary | ICD-10-CM

## 2023-09-10 DIAGNOSIS — M25561 Pain in right knee: Secondary | ICD-10-CM

## 2023-09-10 NOTE — Therapy (Signed)
 OUTPATIENT PHYSICAL THERAPY LOWER EXTREMITY    Patient Name: Donald Payne MRN: [id number] DOB:May 24, 1965, 58 y.o., male Today's Date: 09/10/2023  END OF SESSION:  PT End of Session - 09/10/23 0846     Visit Number 17    Date for PT Re-Evaluation 11/09/23    PT Start Time 0845    PT Stop Time 0930    PT Time Calculation (min) 45 min                    Past Medical History:  Diagnosis Date   Allergic rhinitis    Allergy    Anxiety    Asthma    Depression    Diabetes mellitus (HCC)    Dyslipidemia    Fatigue    GERD (gastroesophageal reflux disease)    Headache    Hemorrhoids    Hyperlipidemia    Hypertension    Hypoxia    IBS (irritable bowel syndrome)    Low testosterone    Prostatitis    Sleep apnea    uses CPAP   Sleep apnea with hypersomnolence    Vitamin D deficiency    Past Surgical History:  Procedure Laterality Date   KNEE ARTHROSCOPY Right    KNEE ARTHROSCOPY WITH MENISCAL REPAIR Right 05/12/2023   Procedure: ARTHROSCOPY, KNEE, WITH MENISCUS REPAIR;  Surgeon: Genelle Standing, MD;  Location: [facility] OR;  Service: Orthopedics;  Laterality: Right;  RIGHT KNEE ARTHROSCOPY WITH MEDIAL MENISCUS REPAIR AND LYSIS OF ADHESIONS   WISDOM TOOTH EXTRACTION     Patient Active Problem List   Diagnosis Date Noted   Arthrofibrosis of knee joint, right 05/12/2023   Hypertensive heart disease 07/06/2018   Asthma 07/06/2018   Sleep apnea 07/06/2018   Morbid obesity (HCC) 07/06/2018   Dyspnea 10/27/2011    PCP: Alm Rav  REFERRING PROVIDER: Standing Genelle  REFERRING DIAG:  [phone] (ICD-10-CM) - Complex tear of medial meniscus of right knee as current injury, subsequent encounter    THERAPY DIAG:  Complex tear of medial meniscus of right knee as current injury, subsequent encounter  S/P lateral meniscus repair of right knee  Stiffness of right knee, not elsewhere classified  Acute pain of right knee  Rationale for Evaluation and Treatment:  Rehabilitation  ONSET DATE: 05/12/23  SUBJECTIVE STATEMENT:  knee should be doing better. Reports little pain but amb in slowly with antalgic gait . States doing squats like MD said but not as often. MD f/u in a week.   PERTINENT HISTORY: R arthroscopy with medial meniscal repair 05/12/23 DM, depression, HTN, anxiety, obesity   PAIN:  Are you having pain? Reports pain a 3/10 aches and pops can be up to 7/10 PRECAUTIONS: Knee  RED FLAGS: None   WEIGHT BEARING RESTRICTIONS: No  FALLS:  Has patient fallen in last 6 months? 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  DIAGNOSTIC FINDINGS: IMPRESSION: 1. Intrasubstance degeneration within junction of the body and posterior horn of the medial meniscus and within the body of the  lateral meniscus. No tear is seen extending through an articular surface of either meniscus. 2. Mild-to-moderate mucoid degeneration of the ACL (ACL cyst). No ACL tear is seen. 3. Mild tricompartmental cartilage degenerative changes. 4. Moderate joint effusion.   COGNITION: Overall cognitive status: Within functional limits for tasks assessed     SENSATION: WFL  EDEMA:  Swelling around R  MUSCLE LENGTH: Hamstrings: tightness in R hamstring  PALPATION: Pain with flexion PROM  LOWER EXTREMITY ROM:  Active ROM Right Eval Seated  Left eval AROM  Right  05/25/23 AROM  Right 08/04/23  Hip flexion      Hip extension      Hip abduction      Hip adduction      Hip internal  rotation      Hip external rotation      Knee flexion 86 w/pain  95 95 P!  Knee extension 15   10 5   Ankle dorsiflexion      Ankle plantarflexion      Ankle inversion      Ankle eversion       (Blank rows = not tested)  LOWER EXTREMITY MMT:  MMT Right eval Left eval  Hip flexion 5   Hip extension    Hip abduction    Hip adduction    Hip internal rotation    Hip external rotation    Knee flexion 4+   Knee extension 5   Ankle dorsiflexion    Ankle plantarflexion    Ankle inversion    Ankle eversion     (Blank rows = not tested)   FUNCTIONAL TESTS:  5 times sit to stand: 19.54s from elevated mat table  Timed up and go (TUG): 12.78s antalgic,   08/04/23 TUG 11 seconds   GAIT: Distance walked: in clinic distances  Assistive device utilized: None Level of assistance: Modified independence Comments: antalgic gait, slowed speed, decreased knee flexion                                                                          TREATMENT DATE:   09/10/23 Elliptical 3 min each way L 4 Bike L 4 PROM RT knee flex  AROM sitting 4-100 Leg ext single leg 5# 2x10 HS curls single leg 20# 2x10 Leg press no weight working on ROM 2x10- able to get to seat 7 at best -PTA assist to increase ROM Leg Press 30# single leg 2 sets 10 seat at 7 Squats on dyna disc 2 sets 10 BOSU squats   08/31/23 Recheck goals and ROM 95 flexion 10 extension NuStep L5x78mins LE only  Bike L3 x17mins Leg ext single leg 5# 2x10 HS curls single leg 20# 2x10 Leg press no weight working on ROM 2x10- able to get to seat 6 at best  PROM to R knee and lateral distraction  STS 2x10  08/04/23 Joint distraction with movements, passive stretch of the right knee Bike seat #15 partial revs for about a minute and then full revs with some trunk lean and hip bow Tmill pushes Elliptical level 2 x 3 minutes Feet on ball K2C, rotation, bridges, isometric LEg press 20# , no weight working on flexion Use of the  Tgun to see  if we could decrease pain and soreness  07/21/23 Lateral distraction to knee  Bike L3 x34mins seat 15  Resisted gait 40# 4 way x4  2 way hip 10# 2x10 Leg press 40# 2x10, RLE 20# 2x10, x10 ROM no weight Lunge on to 6 step  Chair squats x10 PATIENT EDUCATION:  Education details: POC, HEP, icing and elevating Person educated: Patient Education method: Explanation Education comprehension: verbalized understanding  HOME EXERCISE PROGRAM: Access Code: [email or web address] URL: [email or web address] Date: 05/18/2023 Prepared by: Almetta Fam  Exercises - Supine Quad Set  - 1 x daily - 7 x weekly - 2 sets - 10 reps - Supine Heel Slide with Strap  - 1 x daily - 7 x weekly - 2 sets - 10 reps - Sit to Stand  - 1 x daily - 7 x weekly - 2 sets - 10 reps - Seated Long Arc Quad  - 1 x daily - 7 x weekly - 2 sets - 10 reps  ASSESSMENT:  CLINICAL IMPRESSION: pt here 10 days ago,  knee should be doing better. Reports little pain but amb in slowly with antalgic gait . States doing squats like MD said but not as often.MD also rec stretching with utube channel but have not looked at it. Pt states walking a lot and getting in/out of car.MD f/u in a week. Pt still very guarded with flexion. Continue to work on increased ROM and func. Goals and ROM checked.  OBJECTIVE IMPAIRMENTS: Abnormal gait, decreased mobility, difficulty walking, decreased ROM, decreased strength, and pain.   ACTIVITY LIMITATIONS: squatting, stairs, transfers, and locomotion level  PARTICIPATION LIMITATIONS: shopping, community activity, occupation, and yard work  PERSONAL FACTORS: Age and Fitness are also affecting patient's functional outcome.   REHAB POTENTIAL: Good  CLINICAL DECISION MAKING: Stable/uncomplicated  EVALUATION COMPLEXITY: Low  GOALS: Goals reviewed with patient? Yes   SHORT TERM GOALS: Target date: 06/29/23  Independent with initial HEP. Baseline: given 05/18/23 Goal status: met  06/24/23   2. Patient will demonstrate improved functional LE strength by completing 5x STS in <15 seconds. Baseline: 19.54s from elevated mat  Goal status:07/02/23, 13.65s MET 07/16/23   LONG TERM GOALS: Target date: 11/09/23  Independent with advanced/ongoing HEP to improve outcomes and carryover.  Baseline:  Goal status: ongoing   09/10/23 evolving  2.  JEZIEL HOFFMANN will demonstrate R knee flexion to 110 deg to ascend/descend stairs. Baseline: 86 w/pain Goal status: 07/02/23 progressing, 100d 07/09/23, 95d 08/31/23 09/10/23 100  3.  RASHAD AULD will demonstrate full R knee extension for safety with gait. Baseline: 15 Goal status: IN PROGRESS 10d 07/09/23, 10d 08/31/23 09/10/23 4  4.  ANH MANGANO will be able to ambulate at least 600' safely with LRAD and normal gait pattern to access community.  Baseline: antalgic gait Goal status: IN PROGRESS still slight limp 08/31/23- same 09/10/23  5.  SHLOME BALDREE will be able to put his socks on and off.  Baseline: unable to do Goal status: MET 07/09/23    PLAN:  PT FREQUENCY: 2x/week  PT DURATION: 12 weeks  PLANNED INTERVENTIONS: 97110-Therapeutic exercises, 97530- Therapeutic activity, 97112- Neuromuscular re-education, 97535- Self Care, 02859- Manual therapy, [phone]- Gait training, [phone]- Vasopneumatic device, Patient/Family education, Balance training, Stair training, Taping, Dry Needling, Joint mobilization, and Cryotherapy  PLAN FOR NEXT SESSION: keep working on ROM and pain   Jazlene Bares,ANGIE, PTA 09/10/2023, 8:47 AM [place] [facility]. [place], [place], [address] Phone:  [phone]   Fax:  [phone]  Patient Details  Name: SHEDRIC FREDERICKS MRN: [id number] Date of Birth: 1965-09-12 Referring Provider:  Genelle Standing, MD

## 2023-09-17 ENCOUNTER — Ambulatory Visit: Attending: Orthopaedic Surgery | Admitting: Physical Therapy

## 2023-09-17 DIAGNOSIS — Z9889 Other specified postprocedural states: Secondary | ICD-10-CM | POA: Diagnosis present

## 2023-09-17 DIAGNOSIS — M25561 Pain in right knee: Secondary | ICD-10-CM | POA: Insufficient documentation

## 2023-09-17 DIAGNOSIS — S83231D Complex tear of medial meniscus, current injury, right knee, subsequent encounter: Secondary | ICD-10-CM | POA: Diagnosis present

## 2023-09-17 DIAGNOSIS — M25661 Stiffness of right knee, not elsewhere classified: Secondary | ICD-10-CM | POA: Insufficient documentation

## 2023-09-17 NOTE — Therapy (Signed)
 OUTPATIENT PHYSICAL THERAPY LOWER EXTREMITY    Patient Name: Donald Payne MRN: [id number] DOB:[birth year], 58 y.o., male Today's Date: 09/17/2023  END OF SESSION:  PT End of Session - 09/17/23 1653     Visit Number 18    Date for PT Re-Evaluation 11/09/23    Authorization Type [employer]    PT Start Time 1655    PT Stop Time 1735    PT Time Calculation (min) 40 min                    Past Medical History:  Diagnosis Date   Allergic rhinitis    Allergy    Anxiety    Asthma    Depression    Diabetes mellitus (HCC)    Dyslipidemia    Fatigue    GERD (gastroesophageal reflux disease)    Headache    Hemorrhoids    Hyperlipidemia    Hypertension    Hypoxia    IBS (irritable bowel syndrome)    Low testosterone    Prostatitis    Sleep apnea    uses CPAP   Sleep apnea with hypersomnolence    Vitamin D deficiency    Past Surgical History:  Procedure Laterality Date   KNEE ARTHROSCOPY Right    KNEE ARTHROSCOPY WITH MENISCAL REPAIR Right 05/12/2023   Procedure: ARTHROSCOPY, KNEE, WITH MENISCUS REPAIR;  Surgeon: Genelle Standing, MD;  Location: [facility] OR;  Service: Orthopedics;  Laterality: Right;  RIGHT KNEE ARTHROSCOPY WITH MEDIAL MENISCUS REPAIR AND LYSIS OF ADHESIONS   WISDOM TOOTH EXTRACTION     Patient Active Problem List   Diagnosis Date Noted   Arthrofibrosis of knee joint, right 05/12/2023   Hypertensive heart disease 07/06/2018   Asthma 07/06/2018   Sleep apnea 07/06/2018   Morbid obesity (HCC) 07/06/2018   Dyspnea 10/27/2011    PCP: Alm Rav  REFERRING PROVIDER: Standing Genelle  REFERRING DIAG:  [phone] (ICD-10-CM) - Complex tear of medial meniscus of right knee as current injury, subsequent encounter    THERAPY DIAG:  Complex tear of medial meniscus of right knee as current injury, subsequent encounter  S/P lateral meniscus repair of right knee  Stiffness of right knee, not elsewhere classified  Acute pain of right knee  Rationale for  Evaluation and Treatment: Rehabilitation  ONSET DATE: 05/12/23  SUBJECTIVE STATEMENT:  hurt 3 days after last session. Seeing MD in the morning, going to ask about my back too. Amb in with less limp  PERTINENT HISTORY: R arthroscopy with medial meniscal repair 05/12/23 DM, depression, HTN, anxiety, obesity   PAIN:  Are you having pain? Reports pain a 3/10 aches and pops can be up to 7/10 PRECAUTIONS: Knee  RED FLAGS: None   WEIGHT BEARING RESTRICTIONS: No  FALLS:  Has patient fallen in last 6 months? 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  DIAGNOSTIC FINDINGS: IMPRESSION: 1. Intrasubstance degeneration within junction of the body and posterior horn of the medial meniscus and within the body of the lateral meniscus. No  tear is seen extending through an articular surface of either meniscus. 2. Mild-to-moderate mucoid degeneration of the ACL (ACL cyst). No ACL tear is seen. 3. Mild tricompartmental cartilage degenerative changes. 4. Moderate joint effusion.   COGNITION: Overall cognitive status: Within functional limits for tasks assessed     SENSATION: WFL  EDEMA:  Swelling around R  MUSCLE LENGTH: Hamstrings: tightness in R hamstring  PALPATION: Pain with flexion PROM  LOWER EXTREMITY ROM:  Active ROM Right Eval Seated  Left eval AROM  Right  05/25/23 AROM  Right 08/04/23  Hip flexion      Hip extension      Hip abduction      Hip adduction      Hip internal rotation      Hip external  rotation      Knee flexion 86 w/pain  95 95 P!  Knee extension 15   10 5   Ankle dorsiflexion      Ankle plantarflexion      Ankle inversion      Ankle eversion       (Blank rows = not tested)  LOWER EXTREMITY MMT:  MMT Right eval Left eval  Hip flexion 5   Hip extension    Hip abduction    Hip adduction    Hip internal rotation    Hip external rotation    Knee flexion 4+   Knee extension 5   Ankle dorsiflexion    Ankle plantarflexion    Ankle inversion    Ankle eversion     (Blank rows = not tested)   FUNCTIONAL TESTS:  5 times sit to stand: 19.54s from elevated mat table  Timed up and go (TUG): 12.78s antalgic,   08/04/23 TUG 11 seconds   GAIT: Distance walked: in clinic distances  Assistive device utilized: None Level of assistance: Modified independence Comments: antalgic gait, slowed speed, decreased knee flexion                                                                          TREATMENT DATE:   09/17/23 Bike L 4  Seat # 17 them up to #15 PROM RT knee flex  AROM sitting 3-105 Leg Press 30# single leg 2 sets 10 seat at 7 Leg press no weight working on ROM 2x10- able to get to seat 7 at best -PTA assist to increase ROM Leg ext single leg 5# 2x10 HS curls single leg 20# 2x10 6 inch step down 15 x Step tap 12 inch 15 x Fitter flex and ext  2 sets 10    09/10/23 Elliptical 3 min each way L 4 Bike L 4 PROM RT knee flex  AROM sitting 4-100 Leg ext single leg 5# 2x10 HS curls single leg 20# 2x10 Leg press no weight working on ROM 2x10- able to get to seat 7 at best -PTA assist to increase ROM Leg Press 30# single leg 2 sets 10 seat at 7 Squats on dyna disc 2 sets 10 BOSU squats   08/31/23 Recheck goals and ROM 95 flexion 10 extension NuStep L5x27mins LE only  Bike L3 x38mins Leg ext single leg 5# 2x10 HS curls single leg 20# 2x10 Leg press no weight working  on ROM 2x10- able to get to seat 6 at best  PROM to R knee and lateral  distraction  STS 2x10  08/04/23 Joint distraction with movements, passive stretch of the right knee Bike seat #15 partial revs for about a minute and then full revs with some trunk lean and hip bow Tmill pushes Elliptical level 2 x 3 minutes Feet on ball K2C, rotation, bridges, isometric LEg press 20# , no weight working on flexion Use of the Tgun to see if we could decrease pain and soreness  07/21/23 Lateral distraction to knee  Bike L3 x57mins seat 15  Resisted gait 40# 4 way x4  2 way hip 10# 2x10 Leg press 40# 2x10, RLE 20# 2x10, x10 ROM no weight Lunge on to 6 step  Chair squats x10 PATIENT EDUCATION:  Education details: POC, HEP, icing and elevating Person educated: Patient Education method: Explanation Education comprehension: verbalized understanding  HOME EXERCISE PROGRAM: Access Code: [email or web address] URL: [email or web address] Date: 05/18/2023 Prepared by: Almetta Fam  Exercises - Supine Quad Set  - 1 x daily - 7 x weekly - 2 sets - 10 reps - Supine Heel Slide with Strap  - 1 x daily - 7 x weekly - 2 sets - 10 reps - Sit to Stand  - 1 x daily - 7 x weekly - 2 sets - 10 reps - Seated Long Arc Quad  - 1 x daily - 7 x weekly - 2 sets - 10 reps  ASSESSMENT:  CLINICAL IMPRESSION:  goals and ROM assessed and documented. Pt is making slow steady progress. ROM is improving slowly, amb is much better with less limp and compensation. Cuing with ex for full ROM and minimizing compensation.   OBJECTIVE IMPAIRMENTS: Abnormal gait, decreased mobility, difficulty walking, decreased ROM, decreased strength, and pain.   ACTIVITY LIMITATIONS: squatting, stairs, transfers, and locomotion level  PARTICIPATION LIMITATIONS: shopping, community activity, occupation, and yard work  PERSONAL FACTORS: Age and Fitness are also affecting patient's functional outcome.   REHAB POTENTIAL: Good  CLINICAL DECISION MAKING: Stable/uncomplicated  EVALUATION COMPLEXITY:  Low  GOALS: Goals reviewed with patient? Yes   SHORT TERM GOALS: Target date: 06/29/23  Independent with initial HEP. Baseline: given 05/18/23 Goal status: met 06/24/23   2. Patient will demonstrate improved functional LE strength by completing 5x STS in <15 seconds. Baseline: 19.54s from elevated mat  Goal status:07/02/23, 13.65s MET 07/16/23   LONG TERM GOALS: Target date: 11/09/23  Independent with advanced/ongoing HEP to improve outcomes and carryover.  Baseline:  Goal status: ongoing   09/10/23 evolving  2.  NIRVAN LABAN will demonstrate R knee flexion to 110 deg to ascend/descend stairs. Baseline: 86 w/pain Goal status: 07/02/23 progressing, 100d 07/09/23, 95d 08/31/23 09/10/23 100  09/17/23 progressing  3.  JAIZON DEROOS will demonstrate full R knee extension for safety with gait. Baseline: 15 Goal status: IN PROGRESS 10d 07/09/23, 10d 08/31/23 09/10/23 4  09/17/22 progressing  4.  HANSFORD HIRT will be able to ambulate at least 600' safely with LRAD and normal gait pattern to access community.  Baseline: antalgic gait Goal status: IN PROGRESS still slight limp 08/31/23- same 09/10/23. 09/17/23 progressing  5.  JJESUS DINGLEY will be able to put his socks on and off.  Baseline: unable to do Goal status: MET 07/09/23    PLAN:  PT FREQUENCY: 2x/week  PT DURATION: 12 weeks  PLANNED INTERVENTIONS: 97110-Therapeutic exercises, 97530- Therapeutic activity, 97112- Neuromuscular re-education, 97535- Self Care, [id number]- Manual therapy, [id number]- Gait  training, [id number]- Vasopneumatic device, Patient/Family education, Balance training, Stair training, Taping, Dry Needling, Joint mobilization, and Cryotherapy  PLAN FOR NEXT SESSION: keep working on ROM and pain. Pt to call after seeing MD tomorrow   MARSH SNIFF, PTA 09/17/2023, 5:31 PM [facility] [address] [facility] [address] [facility] [place], [place], [address] Phone: [phone]   Fax:  [phone]

## 2023-09-18 ENCOUNTER — Ambulatory Visit (HOSPITAL_BASED_OUTPATIENT_CLINIC_OR_DEPARTMENT_OTHER): Admitting: Orthopaedic Surgery

## 2023-09-18 DIAGNOSIS — S83231D Complex tear of medial meniscus, current injury, right knee, subsequent encounter: Secondary | ICD-10-CM

## 2023-09-18 NOTE — Progress Notes (Signed)
 Post Operative Evaluation    Procedure/Date of Surgery: Right knee lysis of adhesions 4/1  Interval History:   Presents today for follow-up of his right knee.  His range of motion is improving slowly.  He is actually making strides and does believe he has somewhat turned a corner.   PMH/PSH/Family History/Social History/Meds/Allergies:    Past Medical History:  Diagnosis Date   Allergic rhinitis    Allergy    Anxiety    Asthma    Depression    Diabetes mellitus (HCC)    Dyslipidemia    Fatigue    GERD (gastroesophageal reflux disease)    Headache    Hemorrhoids    Hyperlipidemia    Hypertension    Hypoxia    IBS (irritable bowel syndrome)    Low testosterone    Prostatitis    Sleep apnea    uses CPAP   Sleep apnea with hypersomnolence    Vitamin D deficiency    Past Surgical History:  Procedure Laterality Date   KNEE ARTHROSCOPY Right    KNEE ARTHROSCOPY WITH MENISCAL REPAIR Right 05/12/2023   Procedure: ARTHROSCOPY, KNEE, WITH MENISCUS REPAIR;  Surgeon: Genelle Standing, MD;  Location: [facility] OR;  Service: Orthopedics;  Laterality: Right;  RIGHT KNEE ARTHROSCOPY WITH MEDIAL MENISCUS REPAIR AND LYSIS OF ADHESIONS   WISDOM TOOTH EXTRACTION     Social History   Socioeconomic History   Marital status: Married    Spouse name: [name]   Number of children: 0   Years of education: Not on file   Highest education level: Master's degree (e.g., MA, MS, MEng, MEd, MSW, MBA)  Occupational History   Occupation: Field seismologist  Tobacco Use   Smoking status: Never   Smokeless tobacco: Never  Vaping Use   Vaping status: Never Used  Substance and Sexual Activity   Alcohol use: Not Currently   Drug use: No   Sexual activity: Not on file  Other Topics Concern   Not on file  Social History Narrative   Lives with wife   Caffeine- 3-4 diet sodas a day   Social Drivers of Corporate investment banker Strain: Not on file  Food Insecurity: Not  on file  Transportation Needs: Not on file  Physical Activity: Not on file  Stress: Not on file  Social Connections: Not on file   Family History  Problem Relation Age of Onset   Migraines Mother    Colon cancer Mother    Hypertension Mother    Diabetes Mother    Heart disease Father    Diabetes Paternal Grandmother    Heart disease Paternal Grandmother    Prostate cancer Paternal Grandfather    Healthy Other    Coronary artery disease Other        Deceased age 9   Esophageal cancer Neg Hx    Rectal cancer Neg Hx    Stomach cancer Neg Hx    Allergies  Allergen Reactions   Atorvastatin Other (See Comments)    Muscle soreness  Muscle soreness  Muscle soreness  Muscle soreness, Muscle soreness   Budesonide-Formoterol Fumarate Other (See Comments)    Tongue/throat soreness   Metformin Hcl Diarrhea    Other reaction(s): Diarhea   Current Outpatient Medications  Medication Sig Dispense Refill   albuterol  (PROVENTIL ) (2.5 MG/3ML) 0.083% nebulizer  solution Take 2.5 mg by nebulization 4 (four) times daily as needed for wheezing or shortness of breath.     albuterol  (VENTOLIN  HFA) 108 (90 Base) MCG/ACT inhaler Can inhale two puffs every four to six hours as needed for cough or wheeze. 18 g 0   ALPRAZolam  (XANAX ) 0.5 MG tablet Take 1 tablet (0.5 mg total) by mouth at bedtime as needed for anxiety. 30 tablet 2   aspirin  EC 325 MG tablet Take 1 tablet (325 mg total) by mouth daily. 30 tablet 0   azelastine  (ASTELIN ) 0.1 % nasal spray Can use one to two sprays in each nostril one to two times daily as directed. (Patient not taking: Reported on 05/06/2023) 30 mL 5   busPIRone  (BUSPAR ) 10 MG tablet Take 2 tablets (20 mg total) by mouth 2 (two) times daily. TAKE 2 TABLETS BY MOUTH 2 TIMES DAILY. 360 tablet 1   celecoxib  (CELEBREX ) 200 MG capsule TAKE 1 CAPSULE (200 MG TOTAL) BY MOUTH 2 (TWO) TIMES DAILY BETWEEN MEALS AS NEEDED. (Patient not taking: Reported on 05/06/2023) 60 capsule 1    doxepin  (SINEQUAN ) 10 MG capsule Take 1 capsule (10 mg total) by mouth at bedtime. 30 capsule 2   Erenumab -aooe (AIMOVIG ) 70 MG/ML SOAJ INJECT 70 MG INTO THE SKIN EVERY 30 DAYS (Patient not taking: Reported on 05/06/2023) 1 mL 1   famotidine  (PEPCID ) 40 MG tablet TAKE ONE TABLET ONCE EACH EVENING 30 tablet 0   fenofibrate micronized (ANTARA) 43 MG capsule Take 43 mg by mouth daily.     gabapentin  (NEURONTIN ) 300 MG capsule TAKE 2 CAPSULES BY MOUTH 2 TIMES DAILY. 120 capsule 3   HUMALOG 100 UNIT/ML injection Inject 0-200 Units into the skin See admin instructions. Used with Omnipod, fill with 200 units every 3 days     HYDROcodone -acetaminophen  (NORCO) 7.5-325 MG tablet Take 1 tablet by mouth every 6 (six) hours as needed for moderate pain. (Patient not taking: Reported on 05/06/2023) 30 tablet 0   JARDIANCE 25 MG TABS tablet Take 25 mg by mouth daily.     L-Methylfolate 15 MG TABS Take 15 mg by mouth daily.     lisinopril-hydrochlorothiazide (ZESTORETIC) 20-12.5 MG tablet Take 1 tablet by mouth daily.     MOUNJARO 10 MG/0.5ML Pen Inject 10 mg into the skin once a week.     oxyCODONE  (ROXICODONE ) 5 MG immediate release tablet Take 1 tablet (5 mg total) by mouth every 4 (four) hours as needed for severe pain (pain score 7-10) or breakthrough pain. 10 tablet 0   pantoprazole  (PROTONIX ) 40 MG tablet TAKE TWO TABLETS EVERY MORNING AS DIRECTED. 60 tablet 0   pramipexole  (MIRAPEX ) 1.5 MG tablet Take 1 tablet (1.5 mg total) by mouth daily. 45 tablet 2   predniSONE  (DELTASONE ) 20 MG tablet Take 1 tablet (20 mg total) by mouth daily with breakfast. (Patient not taking: Reported on 05/06/2023) 6 tablet 0   rizatriptan (MAXALT) 10 MG tablet Take 10 mg by mouth as needed for migraine.     simvastatin (ZOCOR) 40 MG tablet Take 40 mg by mouth every evening.     SUMAtriptan  (IMITREX ) 100 MG tablet Take 1 tablet (100 mg total) by mouth every 2 (two) hours as needed for migraine. May repeat in 2 hours if headache  persists or recurs. (Patient not taking: Reported on 05/06/2023) 10 tablet 2   SYMBICORT  160-4.5 MCG/ACT inhaler INHALE TWO PUFFS TWICE DAILY TO PREVENT COUGH OR WHEEZE. RINSE MOUTH AFTER USE. (Patient not taking: Reported on  05/06/2023) 10.2 each 0   TRINTELLIX  20 MG TABS tablet Take 1 tablet (20 mg total) by mouth daily. 30 tablet 2   VITAMIN D PO Take 5,000 Units by mouth daily.     No current facility-administered medications for this visit.   No results found.  Review of Systems:   A ROS was performed including pertinent positives and negatives as documented in the HPI.   Musculoskeletal Exam:    There were no vitals taken for this visit.  Right knee incisions are well-appearing without erythema or drainage.  Range of motion is from 0 to 100 degrees in a squatted position.  Distal neurosensory exam is intact  Imaging:      I personally reviewed and interpreted the radiographs.   Assessment:   4 months status post right knee arthroscopic lysis of adhesions overall continues to make slow improvement.  At this time I do believe he will progress to to a full recovery.  I will plan to see him back for his hips and back as well for an assessment  Plan :    - Return to clinic for bilateral hip x-ray and discussion of his lumbar spine      I personally saw and evaluated the patient, and participated in the management and treatment plan.  Elspeth Parker, MD Attending Physician, Orthopedic Surgery  This document was dictated using Dragon voice recognition software. A reasonable attempt at proof reading has been made to minimize errors.

## 2023-10-14 ENCOUNTER — Encounter: Payer: Self-pay | Admitting: Adult Health

## 2023-10-14 ENCOUNTER — Telehealth: Admitting: Adult Health

## 2023-10-14 DIAGNOSIS — G4700 Insomnia, unspecified: Secondary | ICD-10-CM | POA: Diagnosis not present

## 2023-10-14 DIAGNOSIS — F429 Obsessive-compulsive disorder, unspecified: Secondary | ICD-10-CM | POA: Diagnosis not present

## 2023-10-14 DIAGNOSIS — F331 Major depressive disorder, recurrent, moderate: Secondary | ICD-10-CM

## 2023-10-14 DIAGNOSIS — F411 Generalized anxiety disorder: Secondary | ICD-10-CM | POA: Diagnosis not present

## 2023-10-14 MED ORDER — BUSPIRONE HCL 30 MG PO TABS: 30.0000 mg | ORAL_TABLET | Freq: Two times a day (BID) | ORAL | 2 refills | Status: DC

## 2023-10-14 MED ORDER — PRAMIPEXOLE DIHYDROCHLORIDE 1 MG PO TABS: 1.0000 mg | ORAL_TABLET | Freq: Every day | ORAL | 2 refills | Status: DC

## 2023-10-14 MED ORDER — TRINTELLIX 20 MG PO TABS: 20.0000 mg | ORAL_TABLET | Freq: Every day | ORAL | 2 refills | Status: DC

## 2023-10-14 MED ORDER — DOXEPIN HCL 10 MG PO CAPS: 10.0000 mg | ORAL_CAPSULE | Freq: Every day | ORAL | 2 refills | Status: DC

## 2023-10-14 NOTE — Progress Notes (Signed)
 Donald Payne [id number] 02-Jun-1965 58 y.o.  Virtual Visit via Video Note  I connected with pt @ on 10/14/23 at  5:00 PM EDT by a video enabled telemedicine application and verified that I am speaking with the correct person using two identifiers.   I discussed the limitations of evaluation and management by telemedicine and the availability of in person appointments. The patient expressed understanding and agreed to proceed.  I discussed the assessment and treatment plan with the patient. The patient was provided an opportunity to ask questions and all were answered. The patient agreed with the plan and demonstrated an understanding of the instructions.   The patient was advised to call back or seek an in-person evaluation if the symptoms worsen or if the condition fails to improve as anticipated.  I provided 25 minutes of non-face-to-face time during this encounter.  The patient was located at home.  The provider was located at [facility].   Angeline LOISE Sayers, NP   Subjective:   Patient ID:  Donald Payne is a 58 y.o. (DOB 1965/12/17) male.  Chief Complaint: No chief complaint on file.   HPI Donald Payne presents for follow-up of MDD, GAD and OCD.  Previously seen at the [facility].   Describes mood today as ok. Pleasant. Denies tearfulness. Mood symptoms - denies depression, anxiety and irritability. Reports stable interest and motivation. Denies recent panic attacks. Reports some worry, rumination and over thinking - forgot to take medication for 3 days. Reports mood as stable. Stating I feel like I'm doing ok. Taking medications as prescribed.  Energy levels lower with a lack of sleep Active, does not have a regular exercise routine with physical limitations. Enjoys some usual interests and activities. Married. Lives with wife. Wife is a traveling Engineer, civil (consulting). No children - stepchildren. Spending time with family. Appetite adequate. Weight 245 to 250  pounds. Reports sleeping difficulties - pain is an issue. Averages 5 to 6 hours. Focus and concentration stable. Completing tasks. Managing aspects of household. Retired - [employer]. Working at [employer]. Denies SI or HI.  Denies AH or VH. Denies self harm.  Denies substance use.  Previous medication trials: Ambien, Lithium, Caplyta and others.  Review of Systems:  Review of Systems  Musculoskeletal:  Negative for gait problem.  Neurological:  Negative for tremors.  Psychiatric/Behavioral:         Please refer to HPI    Medications: I have reviewed the patient's current medications.  Current Outpatient Medications  Medication Sig Dispense Refill   albuterol  (PROVENTIL ) (2.5 MG/3ML) 0.083% nebulizer solution Take 2.5 mg by nebulization 4 (four) times daily as needed for wheezing or shortness of breath.     albuterol  (VENTOLIN  HFA) 108 (90 Base) MCG/ACT inhaler Can inhale two puffs every four to six hours as needed for cough or wheeze. 18 g 0   ALPRAZolam  (XANAX ) 0.5 MG tablet Take 1 tablet (0.5 mg total) by mouth at bedtime as needed for anxiety. 30 tablet 2   aspirin  EC 325 MG tablet Take 1 tablet (325 mg total) by mouth daily. 30 tablet 0   azelastine  (ASTELIN ) 0.1 % nasal spray Can use one to two sprays in each nostril one to two times daily as directed. (Patient not taking: Reported on 05/06/2023) 30 mL 5   busPIRone  (BUSPAR ) 10 MG tablet Take 2 tablets (20 mg total) by mouth 2 (two) times daily. TAKE 2 TABLETS BY MOUTH 2 TIMES DAILY. 360 tablet 1  celecoxib  (CELEBREX ) 200 MG capsule TAKE 1 CAPSULE (200 MG TOTAL) BY MOUTH 2 (TWO) TIMES DAILY BETWEEN MEALS AS NEEDED. (Patient not taking: Reported on 05/06/2023) 60 capsule 1   doxepin  (SINEQUAN ) 10 MG capsule Take 1 capsule (10 mg total) by mouth at bedtime. 30 capsule 2   Erenumab -aooe (AIMOVIG ) 70 MG/ML SOAJ INJECT 70 MG INTO THE SKIN EVERY 30 DAYS (Patient not taking: Reported on 05/06/2023) 1 mL 1   famotidine   (PEPCID ) 40 MG tablet TAKE ONE TABLET ONCE EACH EVENING 30 tablet 0   fenofibrate micronized (ANTARA) 43 MG capsule Take 43 mg by mouth daily.     gabapentin  (NEURONTIN ) 300 MG capsule TAKE 2 CAPSULES BY MOUTH 2 TIMES DAILY. 120 capsule 3   HUMALOG 100 UNIT/ML injection Inject 0-200 Units into the skin See admin instructions. Used with Omnipod, fill with 200 units every 3 days     HYDROcodone -acetaminophen  (NORCO) 7.5-325 MG tablet Take 1 tablet by mouth every 6 (six) hours as needed for moderate pain. (Patient not taking: Reported on 05/06/2023) 30 tablet 0   JARDIANCE 25 MG TABS tablet Take 25 mg by mouth daily.     L-Methylfolate 15 MG TABS Take 15 mg by mouth daily.     lisinopril-hydrochlorothiazide (ZESTORETIC) 20-12.5 MG tablet Take 1 tablet by mouth daily.     MOUNJARO 10 MG/0.5ML Pen Inject 10 mg into the skin once a week.     oxyCODONE  (ROXICODONE ) 5 MG immediate release tablet Take 1 tablet (5 mg total) by mouth every 4 (four) hours as needed for severe pain (pain score 7-10) or breakthrough pain. 10 tablet 0   pantoprazole  (PROTONIX ) 40 MG tablet TAKE TWO TABLETS EVERY MORNING AS DIRECTED. 60 tablet 0   pramipexole  (MIRAPEX ) 1.5 MG tablet Take 1 tablet (1.5 mg total) by mouth daily. 45 tablet 2   predniSONE  (DELTASONE ) 20 MG tablet Take 1 tablet (20 mg total) by mouth daily with breakfast. (Patient not taking: Reported on 05/06/2023) 6 tablet 0   rizatriptan (MAXALT) 10 MG tablet Take 10 mg by mouth as needed for migraine.     simvastatin (ZOCOR) 40 MG tablet Take 40 mg by mouth every evening.     SUMAtriptan  (IMITREX ) 100 MG tablet Take 1 tablet (100 mg total) by mouth every 2 (two) hours as needed for migraine. May repeat in 2 hours if headache persists or recurs. (Patient not taking: Reported on 05/06/2023) 10 tablet 2   SYMBICORT  160-4.5 MCG/ACT inhaler INHALE TWO PUFFS TWICE DAILY TO PREVENT COUGH OR WHEEZE. RINSE MOUTH AFTER USE. (Patient not taking: Reported on 05/06/2023) 10.2 each 0    TRINTELLIX  20 MG TABS tablet Take 1 tablet (20 mg total) by mouth daily. 30 tablet 2   VITAMIN D PO Take 5,000 Units by mouth daily.     No current facility-administered medications for this visit.    Medication Side Effects: None  Allergies:  Allergies  Allergen Reactions   Atorvastatin Other (See Comments)    Muscle soreness  Muscle soreness  Muscle soreness  Muscle soreness, Muscle soreness   Budesonide-Formoterol Fumarate Other (See Comments)    Tongue/throat soreness   Metformin Hcl Diarrhea    Other reaction(s): Diarhea    Past Medical History:  Diagnosis Date   Allergic rhinitis    Allergy    Anxiety    Asthma    Depression    Diabetes mellitus (HCC)    Dyslipidemia    Fatigue    GERD (gastroesophageal reflux disease)  Headache    Hemorrhoids    Hyperlipidemia    Hypertension    Hypoxia    IBS (irritable bowel syndrome)    Low testosterone    Prostatitis    Sleep apnea    uses CPAP   Sleep apnea with hypersomnolence    Vitamin D deficiency     Family History  Problem Relation Age of Onset   Migraines Mother    Colon cancer Mother    Hypertension Mother    Diabetes Mother    Heart disease Father    Diabetes Paternal Grandmother    Heart disease Paternal Grandmother    Prostate cancer Paternal Grandfather    Healthy Other    Coronary artery disease Other        Deceased age 21   Esophageal cancer Neg Hx    Rectal cancer Neg Hx    Stomach cancer Neg Hx     Social History   Socioeconomic History   Marital status: Married    Spouse name: [name]   Number of children: 0   Years of education: Not on file   Highest education level: Master's degree (e.g., MA, MS, MEng, MEd, MSW, MBA)  Occupational History   Occupation: Field seismologist  Tobacco Use   Smoking status: Never   Smokeless tobacco: Never  Vaping Use   Vaping status: Never Used  Substance and Sexual Activity   Alcohol use: Not Currently   Drug use: No   Sexual activity: Not  on file  Other Topics Concern   Not on file  Social History Narrative   Lives with wife   Caffeine- 3-4 diet sodas a day   Social Drivers of Corporate investment banker Strain: Not on file  Food Insecurity: Not on file  Transportation Needs: Not on file  Physical Activity: Not on file  Stress: Not on file  Social Connections: Not on file  Intimate Partner Violence: Not on file    Past Medical History, Surgical history, Social history, and Family history were reviewed and updated as appropriate.   Please see review of systems for further details on the patient's review from today.   Objective:   Physical Exam:  There were no vitals taken for this visit.  Physical Exam Constitutional:      General: He is not in acute distress. Musculoskeletal:        General: No deformity.  Neurological:     Mental Status: He is alert and oriented to person, place, and time.     Coordination: Coordination normal.  Psychiatric:        Attention and Perception: Attention and perception normal. He does not perceive auditory or visual hallucinations.        Mood and Affect: Mood normal. Mood is not anxious or depressed. Affect is not labile, blunt, angry or inappropriate.        Speech: Speech normal.        Behavior: Behavior normal.        Thought Content: Thought content normal. Thought content is not paranoid or delusional. Thought content does not include homicidal or suicidal ideation. Thought content does not include homicidal or suicidal plan.        Cognition and Memory: Cognition and memory normal.        Judgment: Judgment normal.     Comments: Insight intact     Lab Review:     Component Value Date/Time   NA 140 05/11/2023 1400   K 3.9 05/11/2023 1400  CL 102 05/11/2023 1400   CO2 28 05/11/2023 1400   GLUCOSE 122 (H) 05/11/2023 1400   BUN 25 (H) 05/11/2023 1400   CREATININE 1.33 (H) 05/11/2023 1400   CALCIUM 9.7 05/11/2023 1400   GFRNONAA >60 05/11/2023 1400        Component Value Date/Time   WBC 8.8 05/11/2023 1400   RBC 5.67 05/11/2023 1400   HGB 15.8 05/11/2023 1400   HGB 16.1 04/08/2018 0811   HCT 48.7 05/11/2023 1400   HCT 47.4 04/08/2018 0811   PLT 239 05/11/2023 1400   PLT 165 04/08/2018 0811   MCV 85.9 05/11/2023 1400   MCV 87 04/08/2018 0811   MCH 27.9 05/11/2023 1400   MCHC 32.4 05/11/2023 1400   RDW 14.0 05/11/2023 1400   RDW 14.2 04/08/2018 0811   LYMPHSABS 1.1 04/08/2018 0811   EOSABS 0.3 04/08/2018 0811   BASOSABS 0.1 04/08/2018 0811    No results found for: POCLITH, LITHIUM   No results found for: PHENYTOIN, PHENOBARB, VALPROATE, CBMZ   .res Assessment: Plan:    Plan:  PDMP reviewed  Trintellix  20mg  daily Doxepin  10mg  at hs  Buspar  20mg  BID  Mirapex  1.5mg  daily   Xanax  0.5mg  at hs for sleep   RTC 3 months  25 minutes spent dedicated to the care of this patient on the date of this encounter to include pre-visit review of records, ordering of medication, post visit documentation, and face-to-face time with the patient discussing MDD, GAD, OCD, and insomnia. Discussed continuing current medication regimen.  Patient advised to contact office with any questions, adverse effects, or acute worsening in signs and symptoms.   Discussed potential benefits, risk, and side effects of benzodiazepines to include potential risk of tolerance and dependence, as well as possible drowsiness.  Advised patient not to drive if experiencing drowsiness and to take lowest possible effective dose to minimize risk of dependence and tolerance.   There are no diagnoses linked to this encounter.   Please see After Visit Summary for patient specific instructions.  Future Appointments  Date Time Provider Department Center  10/14/2023  5:00 PM Rozelia Catapano Nattalie, NP CP-CP None    No orders of the defined types were placed in this encounter.     -------------------------------

## 2023-11-17 ENCOUNTER — Other Ambulatory Visit: Payer: Self-pay | Admitting: Adult Health

## 2023-11-17 DIAGNOSIS — G4700 Insomnia, unspecified: Secondary | ICD-10-CM

## 2023-11-17 DIAGNOSIS — F429 Obsessive-compulsive disorder, unspecified: Secondary | ICD-10-CM

## 2023-11-17 DIAGNOSIS — F411 Generalized anxiety disorder: Secondary | ICD-10-CM

## 2023-11-18 ENCOUNTER — Other Ambulatory Visit: Payer: Self-pay | Admitting: Endocrinology

## 2023-11-18 DIAGNOSIS — K8020 Calculus of gallbladder without cholecystitis without obstruction: Secondary | ICD-10-CM

## 2023-11-19 ENCOUNTER — Other Ambulatory Visit: Payer: Self-pay | Admitting: Endocrinology

## 2023-11-19 ENCOUNTER — Ambulatory Visit
Admission: RE | Admit: 2023-11-19 | Discharge: 2023-11-19 | Disposition: A | Discharge status: Home / Self Care | Source: Ambulatory Visit | Attending: Endocrinology | Admitting: Endocrinology

## 2023-11-19 DIAGNOSIS — K8020 Calculus of gallbladder without cholecystitis without obstruction: Secondary | ICD-10-CM

## 2023-11-20 ENCOUNTER — Other Ambulatory Visit: Payer: Self-pay | Admitting: Surgery

## 2023-12-14 ENCOUNTER — Encounter: Payer: Self-pay | Admitting: Radiology

## 2023-12-16 NOTE — Progress Notes (Addendum)
 Surgical Instructions   Your procedure is scheduled on Wednesday, November 12th. Report to [facility] Main Entrance A at 11:15 A.M., then check in with the Admitting office. Any questions or running late day of surgery: call [phone]  Questions prior to your surgery date: call [phone], Monday-Friday, 8am-4pm. If you experience any cold or flu symptoms such as cough, fever, chills, shortness of breath, etc. between now and your scheduled surgery, please notify us  at the above number.     Remember:  Do not eat after midnight the night before your surgery.  You may drink clear liquids until 10:15 the morning of your surgery.   Clear liquids allowed are: Water, Non-Citrus Juices (without pulp), Carbonated Beverages, Clear Tea (no milk, honey, etc.), Black Coffee Only (NO MILK, CREAM OR POWDERED CREAMER of any kind), and Gatorade.    Take these medicines the morning of surgery with A SIP OF WATER  busPIRone  (BUSPAR )  gabapentin  (NEURONTIN )  pantoprazole  (PROTONIX )  pramipexole  (MIRAPEX )  TRINTELLIX     May take these medicines IF NEEDED: albuterol  (VENTOLIN  HFA) inhaler - bring with you on day of surgery  ALPRAZolam  (XANAX )  rizatriptan (MAXALT)    One week prior to surgery, STOP taking any Aspirin  (unless otherwise instructed by your surgeon) Aleve, Naproxen, Ibuprofen , Motrin , Advil , Goody's, BC's, all herbal medications, fish oil, and non-prescription vitamins.  WHAT DO I DO ABOUT MY DIABETES MEDICATION?   HOLD your JARDIANCE for 72 hours prior to surgery.  Last dose should be on Saturday, November 8th. .       Per your physician's instruction, HOLD your tirzepatide [facility]) starting today.    If your CBG is greater than 220 mg/dL, you may take  of your sliding scale (correction) dose of insulin .  Regarding your insulin  pump  Contact your PCP or endocrinologist OR reduce your basal rate by 20% at midnight the night before you surgery.  HOW TO MANAGE YOUR  DIABETES BEFORE AND AFTER SURGERY  Why is it important to control my blood sugar before and after surgery? Improving blood sugar levels before and after surgery helps healing and can limit problems. A way of improving blood sugar control is eating a healthy diet by:  Eating less sugar and carbohydrates  Increasing activity/exercise  Talking with your doctor about reaching your blood sugar goals High blood sugars (greater than 180 mg/dL) can raise your risk of infections and slow your recovery, so you will need to focus on controlling your diabetes during the weeks before surgery. Make sure that the doctor who takes care of your diabetes knows about your planned surgery including the date and location.  How do I manage my blood sugar before surgery? Check your blood sugar at least 4 times a day, starting 2 days before surgery, to make sure that the level is not too high or low.  Check your blood sugar the morning of your surgery when you wake up and every 2 hours until you get to the Short Stay unit.  If your blood sugar is less than 70 mg/dL, you will need to treat for low blood sugar: Do not take insulin . Treat a low blood sugar (less than 70 mg/dL) with  cup of clear juice (cranberry or apple), 4 glucose tablets, OR glucose gel. Recheck blood sugar in 15 minutes after treatment (to make sure it is greater than 70 mg/dL). If your blood sugar is not greater than 70 mg/dL on recheck, call [phone] for further instructions. Report your blood sugar to the  short stay nurse when you get to Short Stay.  If you are admitted to the hospital after surgery: Your blood sugar will be checked by the staff and you will probably be given insulin  after surgery (instead of oral diabetes medicines) to make sure you have good blood sugar levels. The goal for blood sugar control after surgery is 80-180 mg/dL.                      Do NOT Smoke (Tobacco/Vaping) for 24 hours prior to your procedure.  If  you use a CPAP at night, you may bring your mask/headgear for your overnight stay.   You will be asked to remove any contacts, glasses, piercing's, hearing aid's, dentures/partials prior to surgery. Please bring cases for these items if needed.    Patients discharged the day of surgery will not be allowed to drive home, and someone needs to stay with them for 24 hours.  SURGICAL WAITING ROOM VISITATION Patients may have no more than 2 support people in the waiting area - these visitors may rotate.   Pre-op nurse will coordinate an appropriate time for 1 ADULT support person, who may not rotate, to accompany patient in pre-op.  Children under the age of 96 must have an adult with them who is not the patient and must remain in the main waiting area with an adult.  If the patient needs to stay at the hospital during part of their recovery, the visitor guidelines for inpatient rooms apply.  Please refer to the [facility] website for the visitor guidelines for any additional information.   If you received a COVID test during your pre-op visit  it is requested that you wear a mask when out in public, stay away from anyone that may not be feeling well and notify your surgeon if you develop symptoms. If you have been in contact with anyone that has tested positive in the last 10 days please notify you surgeon.      Pre-operative CHG Bathing Instructions   You can play a key role in reducing the risk of infection after surgery. Your skin needs to be as free of germs as possible. You can reduce the number of germs on your skin by washing with CHG (chlorhexidine  gluconate) soap before surgery. CHG is an antiseptic soap that kills germs and continues to kill germs even after washing.   DO NOT use if you have an allergy to chlorhexidine /CHG or antibacterial soaps. If your skin becomes reddened or irritated, stop using the CHG and notify one of our RNs at [phone].              TAKE A SHOWER THE  NIGHT BEFORE SURGERY   Please keep in mind the following:  DO NOT shave, including legs and underarms, 48 hours prior to surgery.   You may shave your face before/day of surgery.  Place clean sheets on your bed the night before surgery Use a clean washcloth (not used since being washed) for shower. DO NOT sleep with pet's night before surgery.  CHG Shower Instructions:  Wash your face and private area with normal soap. If you choose to wash your hair, wash first with your normal shampoo.  After you use shampoo/soap, rinse your hair and body thoroughly to remove shampoo/soap residue.  Turn the water OFF and apply half the bottle of CHG soap to a CLEAN washcloth.  Apply CHG soap ONLY FROM YOUR NECK DOWN TO YOUR TOES (washing  for 3-5 minutes)  DO NOT use CHG soap on face, private areas, open wounds, or sores.  Pay special attention to the area where your surgery is being performed.  If you are having back surgery, having someone wash your back for you may be helpful. Wait 2 minutes after CHG soap is applied, then you may rinse off the CHG soap.  Pat dry with a clean towel  Put on clean pajamas    Additional instructions for the day of surgery: If you choose, you may shower the morning of surgery with an antibacterial soap.  DO NOT APPLY any lotions, deodorants, cologne, or perfumes.   Do not wear jewelry or makeup Do not wear nail polish, gel polish, artificial nails, or any other type of covering on natural nails (fingers and toes) Do not bring valuables to the hospital. [facility] is not responsible for valuables/personal belongings. Put on clean/comfortable clothes.  Please brush your teeth.  Ask your nurse before applying any prescription medications to the skin.

## 2023-12-17 ENCOUNTER — Encounter (HOSPITAL_COMMUNITY): Payer: Self-pay

## 2023-12-17 ENCOUNTER — Other Ambulatory Visit: Payer: Self-pay

## 2023-12-17 ENCOUNTER — Encounter (HOSPITAL_COMMUNITY)
Admission: RE | Admit: 2023-12-17 | Discharge: 2023-12-17 | Disposition: A | Discharge status: Home / Self Care | Source: Ambulatory Visit | Attending: Surgery | Admitting: Surgery

## 2023-12-17 VITALS — BP 134/66 | HR 104 | Temp 98.2°F | Resp 17 | Ht 73.0 in | Wt 260.0 lb

## 2023-12-17 DIAGNOSIS — Z01818 Encounter for other preprocedural examination: Secondary | ICD-10-CM

## 2023-12-17 DIAGNOSIS — Z794 Long term (current) use of insulin: Secondary | ICD-10-CM | POA: Insufficient documentation

## 2023-12-17 DIAGNOSIS — Z01812 Encounter for preprocedural laboratory examination: Secondary | ICD-10-CM | POA: Insufficient documentation

## 2023-12-17 LAB — CBC
HCT: 47.2 % (ref 39.0–52.0)
Hemoglobin: 15.1 g/dL (ref 13.0–17.0)
MCH: 28.1 pg (ref 26.0–34.0)
MCHC: 32 g/dL (ref 30.0–36.0)
MCV: 87.7 fL (ref 80.0–100.0)
Platelets: 227 K/uL (ref 150–400)
RBC: 5.38 MIL/uL (ref 4.22–5.81)
RDW: 14.3 % (ref 11.5–15.5)
WBC: 9.9 K/uL (ref 4.0–10.5)
nRBC: 0 % (ref 0.0–0.2)

## 2023-12-17 LAB — BASIC METABOLIC PANEL WITH GFR
Anion gap: 10 (ref 5–15)
BUN: 30 mg/dL — ABNORMAL HIGH (ref 6–20)
CO2: 24 mmol/L (ref 22–32)
Calcium: 9.2 mg/dL (ref 8.9–10.3)
Chloride: 108 mmol/L (ref 98–111)
Creatinine, Ser: 1.45 mg/dL — ABNORMAL HIGH (ref 0.61–1.24)
GFR, Estimated: 57 mL/min — ABNORMAL LOW (ref >60)
Glucose, Bld: 221 mg/dL — ABNORMAL HIGH (ref 70–99)
Potassium: 3.9 mmol/L (ref 3.5–5.1)
Sodium: 142 mmol/L (ref 135–145)

## 2023-12-17 LAB — HEMOGLOBIN A1C
Hgb A1c MFr Bld: 7.8 % — ABNORMAL HIGH (ref 4.8–5.6)
Mean Plasma Glucose: 177.16 mg/dL

## 2023-12-17 LAB — GLUCOSE, CAPILLARY: Glucose-Capillary: 201 mg/dL — ABNORMAL HIGH (ref 70–99)

## 2023-12-17 NOTE — Progress Notes (Signed)
 PCP - [place] Cardiologist - Dr. Redell Leiter  PPM/ICD - denies Device Orders - n/a Rep Notified - n/a  Chest x-ray - n/a EKG - 05-11-23 Stress Test - 07-27-18 ECHO - 10-09-11 Cardiac Cath - denies  Sleep Study - yes, positive CPAP - wears nightly, not sure of settings  Fasting Blood Sugar - 120-140's  Checks Blood Sugar _____ times a day. Patient wears a Dexcom on left arm and has an insulin  pump. Checks blood sugar multiple times a day.   Last dose of GLP1 agonist-  Per patient has stopped mounjaro per his endocrinologist.  Will be changing the dosage after gallbladder is removed as it is currently not working for him.    Blood Thinner Instructions: denies Aspirin  Instructions:denies  ERAS Protcol - eras until 1015 PRE-SURGERY Ensure or G2- none   COVID TEST- n/a   Anesthesia review: yes   Patient denies shortness of breath, fever, cough and chest pain at PAT appointment. HTN, DM, sleep apnea w/cpap   All instructions explained to the patient, with a verbal understanding of the material. Patient agrees to go over the instructions while at home for a better understanding. Patient also instructed to self quarantine after being tested for COVID-19. The opportunity to ask questions was provided.

## 2023-12-21 ENCOUNTER — Other Ambulatory Visit: Payer: Self-pay | Admitting: Adult Health

## 2023-12-21 DIAGNOSIS — F429 Obsessive-compulsive disorder, unspecified: Secondary | ICD-10-CM

## 2023-12-21 DIAGNOSIS — F411 Generalized anxiety disorder: Secondary | ICD-10-CM

## 2023-12-22 NOTE — H&P (Signed)
 REFERRING PHYSICIAN: Tommas Littie POUR, MD PROVIDER: VICENTA DASIE POLI, MD MRN: [id number] DOB: Oct 28, 1965  Subjective   Chief Complaint: New Consultation (- Cholelithiasis-pt is symptomatic)  History of Present Illness: Donald Payne is a 58 y.o. male who is seen today as an office consultation for evaluation of New Consultation (- Cholelithiasis-pt is symptomatic)  This is a 58 year old gentleman who is referred here for symptomatic gallstones. He has been having right upper quadrant pain for many years. He has had an ultrasound in 2020 showing multiple gallstones. He has had some slight nausea and diarrhea but no emesis and no episodes of jaundiced. The pain is mild in intensity and will hurt to the epigastrium. He is otherwise without complaints.  Review of Systems: A complete review of systems was obtained from the patient. I have reviewed this information and discussed as appropriate with the patient. See HPI as well for other ROS.  ROS   Medical History: Past Medical History:  Diagnosis Date  Anxiety  Diabetes mellitus without complication (CMS/HHS-HCC)  GERD (gastroesophageal reflux disease)  Hypertension  Sleep apnea   Patient Active Problem List  Diagnosis  Allergic rhinitis  Anxiety  Asthma (HHS-HCC)  Obstructive sleep apnea syndrome  Sleep apnea  Dyspnea  Dysuria  Essential hypertension  Gallstones  Gastroesophageal reflux disease  High-tone pelvic floor dysfunction  Hyperlipidemia  Hypertensive heart disease  Induration penis plastica  Insomnia  Low testosterone  Mild intermittent asthma (HHS-HCC)  Morbid obesity (CMS-HCC)  Obesity  Polyneuropathy due to type 2 diabetes mellitus (CMS/HHS-HCC)  Pure hypercholesterolemia  Major depression  Recurrent major depression ()  Restless legs  Steatosis of liver  Testicular hypofunction  Type 2 diabetes mellitus with other specified complication (CMS/HHS-HCC)  Type 2 diabetes mellitus without  complications (CMS/HHS-HCC)   Past Surgical History:  Procedure Laterality Date  JOINT REPLACEMENT  Wisdom Teeth    Allergies  Allergen Reactions  Atorvastatin Muscle Pain  Muscle soreness  Budesonide-Formoterol Other (See Comments)  Tongue sensitivity    Current Outpatient Medications on File Prior to Visit  Medication Sig Dispense Refill  albuterol  (PROVENTIL ) 2.5 mg /3 mL (0.083 %) nebulizer solution Inhale into the lungs  ALPRAZolam  (XANAX ) 0.5 MG tablet 1/2 tablet  azelastine  (ASTELIN ) 137 mcg nasal spray Can use one to two sprays in each nostril one to two times daily as directed.  blood-glucose sensor (DEXCOM G7 SENSOR) Devi EVERY 10 DAYS 30 DAYS  busPIRone  (BUSPAR ) 30 MG tablet Take 30 mg by mouth 2 (two) times daily  cholecalciferol (VITAMIN D3) 2,000 unit capsule 1 capsule (2,000 Units total)  doxepin  (SINEQUAN ) 10 MG capsule 1 capsule at bedtime  famotidine  (PEPCID ) 40 MG tablet TAKE ONE TABLET ONCE EACH EVENING  fenofibrate micronized (ANTARA) 43 MG capsule take 1 capsule by mouth every day for 30 days  gabapentin  (NEURONTIN ) 100 MG capsule 2 capsules  pantoprazole  (PROTONIX ) 40 MG DR tablet TAKE TWO TABLETS EVERY MORNING AS DIRECTED.  pramipexole  (MIRAPEX  ER) 1.5 mg ER tablet 1 mg  rizatriptan (MAXALT) 10 MG tablet Take 10 mg by mouth every 2 (two) hours as needed  simvastatin (ZOCOR) 40 MG tablet 1 tablet in the evening  SYMBICORT  160-4.5 mcg/actuation inhaler INHALE TWO PUFFS TWICE DAILY TO PREVENT COUGH OR WHEEZE. RINSE MOUTH AFTER USE.  TRINTELLIX  20 mg tablet TAKE 1.5 TABLETS BY MOUTH ONCE A DAY  insulin  LISPRO PROTAMINE-LISPRO (HUMALOG MIX 75-25 KWIKPEN) 100 unit/mL (75-25) pen injector 35 untis (Patient not taking: Reported on 11/20/2023)  zolpidem (AMBIEN) 10 mg  tablet 1 tablet (10 mg total) (Patient not taking: Reported on 11/20/2023)   No current facility-administered medications on file prior to visit.   Family History  Problem Relation Age of Onset   Stroke Mother  Obesity Mother  High blood pressure (Hypertension) Mother  Hyperlipidemia (Elevated cholesterol) Mother  Diabetes Mother  Diabetes Father  Coronary Artery Disease (Blocked arteries around heart) Brother    Social History   Tobacco Use  Smoking Status Never  Smokeless Tobacco Never    Social History   Socioeconomic History  Marital status: Unknown  Tobacco Use  Smoking status: Never  Smokeless tobacco: Never  Vaping Use  Vaping status: Never Used  Substance and Sexual Activity  Alcohol use: Not Currently  Drug use: Never   Social Drivers of Health   Housing Stability: Unknown (11/20/2023)  Housing Stability Vital Sign  Homeless in the Last Year: No   Objective:   Vitals:   BP: 112/68  Pulse: 83  Temp: 36.7 C (98 F)  SpO2: 97%  Weight: (!) 113.4 kg (250 lb)  Height: 185.4 cm (6' 1)   Body mass index is 32.98 kg/m.  Physical Exam   He appears well on exam  Today his abdomen is soft and nontender. There is no hepatomegaly  Labs, Imaging and Diagnostic Testing: I have reviewed his notes in the electronic medical records including notes from his referring provider. His liver function tests are normal. He just had an ultrasound yesterday but the results are not back in. I reviewed his ultrasound showing gallstones from 2020  Assessment and Plan:   Diagnoses and all orders for this visit:  Symptomatic cholelithiasis   At this point I long discussion with the patient and gave him literature regarding symptomatic gallstones. We discussed the reason to proceed with surgery for gallstones and the risk of complications without surgery. I explained the surgical procedure in detail. We discussed the risks which includes but is not limited to bleeding, infection, injury to surrounding structures, bile duct injury, bile leak, the chance this may not resolve all of his symptoms, cardiopulmonary issues with anesthesia, blood clots, postoperative  recovery, excetra. He understands and wishes to proceed with surgery which will be scheduled.

## 2023-12-23 ENCOUNTER — Ambulatory Visit (HOSPITAL_COMMUNITY)
Admission: RE | Admit: 2023-12-23 | Discharge: 2023-12-23 | Disposition: A | Discharge status: Home / Self Care | Attending: Surgery | Admitting: Surgery

## 2023-12-23 ENCOUNTER — Ambulatory Visit (HOSPITAL_COMMUNITY): Payer: Self-pay | Admitting: Physician Assistant

## 2023-12-23 ENCOUNTER — Ambulatory Visit (HOSPITAL_COMMUNITY): Admitting: Anesthesiology

## 2023-12-23 ENCOUNTER — Encounter (HOSPITAL_COMMUNITY)
Admission: RE | Disposition: A | Discharge status: Home / Self Care | Payer: Self-pay | Source: Home / Self Care | Attending: Surgery

## 2023-12-23 DIAGNOSIS — I119 Hypertensive heart disease without heart failure: Secondary | ICD-10-CM

## 2023-12-23 DIAGNOSIS — K760 Fatty (change of) liver, not elsewhere classified: Secondary | ICD-10-CM | POA: Diagnosis not present

## 2023-12-23 DIAGNOSIS — K8010 Calculus of gallbladder with chronic cholecystitis without obstruction: Secondary | ICD-10-CM | POA: Diagnosis not present

## 2023-12-23 DIAGNOSIS — J4520 Mild intermittent asthma, uncomplicated: Secondary | ICD-10-CM | POA: Diagnosis not present

## 2023-12-23 DIAGNOSIS — Z79899 Other long term (current) drug therapy: Secondary | ICD-10-CM | POA: Insufficient documentation

## 2023-12-23 DIAGNOSIS — F418 Other specified anxiety disorders: Secondary | ICD-10-CM | POA: Insufficient documentation

## 2023-12-23 DIAGNOSIS — G4730 Sleep apnea, unspecified: Secondary | ICD-10-CM

## 2023-12-23 DIAGNOSIS — J45909 Unspecified asthma, uncomplicated: Secondary | ICD-10-CM

## 2023-12-23 DIAGNOSIS — K8020 Calculus of gallbladder without cholecystitis without obstruction: Secondary | ICD-10-CM | POA: Diagnosis present

## 2023-12-23 DIAGNOSIS — Z01818 Encounter for other preprocedural examination: Secondary | ICD-10-CM

## 2023-12-23 DIAGNOSIS — E1142 Type 2 diabetes mellitus with diabetic polyneuropathy: Secondary | ICD-10-CM | POA: Diagnosis not present

## 2023-12-23 DIAGNOSIS — K219 Gastro-esophageal reflux disease without esophagitis: Secondary | ICD-10-CM | POA: Diagnosis not present

## 2023-12-23 DIAGNOSIS — J449 Chronic obstructive pulmonary disease, unspecified: Secondary | ICD-10-CM | POA: Diagnosis not present

## 2023-12-23 DIAGNOSIS — Z7951 Long term (current) use of inhaled steroids: Secondary | ICD-10-CM | POA: Insufficient documentation

## 2023-12-23 DIAGNOSIS — G4733 Obstructive sleep apnea (adult) (pediatric): Secondary | ICD-10-CM | POA: Diagnosis not present

## 2023-12-23 DIAGNOSIS — Z794 Long term (current) use of insulin: Secondary | ICD-10-CM | POA: Diagnosis not present

## 2023-12-23 HISTORY — PX: CHOLECYSTECTOMY: SHX55

## 2023-12-23 LAB — GLUCOSE, CAPILLARY
Glucose-Capillary: 187 mg/dL — ABNORMAL HIGH (ref 70–99)
Glucose-Capillary: 179 mg/dL — ABNORMAL HIGH (ref 70–99)

## 2023-12-23 SURGERY — LAPAROSCOPIC CHOLECYSTECTOMY
Anesthesia: General

## 2023-12-23 MED ORDER — OXYCODONE HCL 5 MG PO TABS
ORAL_TABLET | ORAL | Status: AC
  Filled 2023-12-23: qty 1

## 2023-12-23 MED ORDER — ACETAMINOPHEN 500 MG PO TABS
1000.0000 mg | ORAL_TABLET | ORAL | Status: AC
  Administered 2023-12-23: 1000 mg via ORAL

## 2023-12-23 MED ORDER — INSULIN ASPART 100 UNIT/ML IJ SOLN
0.0000 [IU] | INTRAMUSCULAR | Status: DC | PRN
  Administered 2023-12-23: 4 [IU] via SUBCUTANEOUS
  Filled 2023-12-23: qty 0.14
  Filled 2023-12-23: qty 4

## 2023-12-23 MED ORDER — BUPIVACAINE-EPINEPHRINE 0.25% -1:200000 IJ SOLN
INTRAMUSCULAR | Status: DC | PRN
  Administered 2023-12-23: 10 mL

## 2023-12-23 MED ORDER — BUPIVACAINE-EPINEPHRINE (PF) 0.25% -1:200000 IJ SOLN
INTRAMUSCULAR | Status: AC
Start: 2023-12-23 — End: 2023-12-23
  Filled 2023-12-23: qty 30

## 2023-12-23 MED ORDER — OXYCODONE HCL 5 MG PO TABS
5.0000 mg | ORAL_TABLET | Freq: Once | ORAL | Status: AC | PRN
  Administered 2023-12-23: 5 mg via ORAL

## 2023-12-23 MED ORDER — PROPOFOL 10 MG/ML IV BOLUS
INTRAVENOUS | Status: AC
Start: 2023-12-23 — End: 2023-12-23
  Filled 2023-12-23: qty 20

## 2023-12-23 MED ORDER — CHLORHEXIDINE GLUCONATE 0.12 % MT SOLN
15.0000 mL | Freq: Once | OROMUCOSAL | Status: AC
  Administered 2023-12-23: 15 mL via OROMUCOSAL
  Filled 2023-12-23: qty 15

## 2023-12-23 MED ORDER — LIDOCAINE 2% (20 MG/ML) 5 ML SYRINGE
INTRAMUSCULAR | Status: AC
Start: 2023-12-23 — End: 2023-12-23
  Filled 2023-12-23: qty 5

## 2023-12-23 MED ORDER — LIDOCAINE 2% (20 MG/ML) 5 ML SYRINGE
INTRAMUSCULAR | Status: DC | PRN
  Administered 2023-12-23: 20 mg via INTRAVENOUS

## 2023-12-23 MED ORDER — ROCURONIUM BROMIDE 10 MG/ML (PF) SYRINGE
PREFILLED_SYRINGE | INTRAVENOUS | Status: AC
Start: 2023-12-23 — End: 2023-12-23
  Filled 2023-12-23: qty 10

## 2023-12-23 MED ORDER — ONDANSETRON HCL 4 MG/2ML IJ SOLN
INTRAMUSCULAR | Status: AC
Start: 2023-12-23 — End: 2023-12-23
  Filled 2023-12-23: qty 2

## 2023-12-23 MED ORDER — FENTANYL CITRATE (PF) 250 MCG/5ML IJ SOLN
INTRAMUSCULAR | Status: AC
  Filled 2023-12-23: qty 5

## 2023-12-23 MED ORDER — MIDAZOLAM HCL 2 MG/2ML IJ SOLN
INTRAMUSCULAR | Status: AC
  Filled 2023-12-23: qty 2

## 2023-12-23 MED ORDER — HYDROMORPHONE HCL 1 MG/ML IJ SOLN
0.2500 mg | INTRAMUSCULAR | Status: DC | PRN
  Administered 2023-12-23: 0.5 mg via INTRAVENOUS

## 2023-12-23 MED ORDER — CHLORHEXIDINE GLUCONATE CLOTH 2 % EX PADS: 6.0000 | MEDICATED_PAD | Freq: Once | CUTANEOUS | Status: DC

## 2023-12-23 MED ORDER — LACTATED RINGERS IV SOLN: INTRAVENOUS | Status: DC

## 2023-12-23 MED ORDER — MIDAZOLAM HCL (PF) 2 MG/2ML IJ SOLN
INTRAMUSCULAR | Status: DC | PRN
Start: 2023-12-23 — End: 2023-12-23
  Administered 2023-12-23: 2 mg via INTRAVENOUS

## 2023-12-23 MED ORDER — CHLORHEXIDINE GLUCONATE CLOTH 2 % EX PADS
6.0000 | MEDICATED_PAD | Freq: Once | CUTANEOUS | Status: AC
  Administered 2023-12-23: 6 via TOPICAL

## 2023-12-23 MED ORDER — ONDANSETRON HCL 4 MG/2ML IJ SOLN
INTRAMUSCULAR | Status: DC | PRN
  Administered 2023-12-23: 4 mg via INTRAVENOUS

## 2023-12-23 MED ORDER — SODIUM CHLORIDE 0.9 % IR SOLN
Status: DC | PRN
  Administered 2023-12-23: 1000 mL

## 2023-12-23 MED ORDER — ROCURONIUM BROMIDE 10 MG/ML (PF) SYRINGE
PREFILLED_SYRINGE | INTRAVENOUS | Status: DC | PRN
  Administered 2023-12-23: 10 mg via INTRAVENOUS
  Administered 2023-12-23: 60 mg via INTRAVENOUS
  Administered 2023-12-23: 5 mg via INTRAVENOUS

## 2023-12-23 MED ORDER — PROPOFOL 10 MG/ML IV BOLUS
INTRAVENOUS | Status: DC | PRN
  Administered 2023-12-23: 150 mg via INTRAVENOUS

## 2023-12-23 MED ORDER — ENSURE PRE-SURGERY PO LIQD: 296.0000 mL | Freq: Once | ORAL | Status: DC

## 2023-12-23 MED ORDER — OXYCODONE HCL 5 MG/5ML PO SOLN: 5.0000 mg | Freq: Once | ORAL | Status: AC | PRN

## 2023-12-23 MED ORDER — FENTANYL CITRATE (PF) 250 MCG/5ML IJ SOLN
INTRAMUSCULAR | Status: DC | PRN
Start: 2023-12-23 — End: 2023-12-23
  Administered 2023-12-23 (×3): 50 ug via INTRAVENOUS

## 2023-12-23 MED ORDER — ORAL CARE MOUTH RINSE: 15.0000 mL | Freq: Once | OROMUCOSAL | Status: AC

## 2023-12-23 MED ORDER — 0.9 % SODIUM CHLORIDE (POUR BTL) OPTIME
TOPICAL | Status: DC | PRN
  Administered 2023-12-23: 1000 mL

## 2023-12-23 MED ORDER — PHENYLEPHRINE 80 MCG/ML (10ML) SYRINGE FOR IV PUSH (FOR BLOOD PRESSURE SUPPORT)
PREFILLED_SYRINGE | INTRAVENOUS | Status: DC | PRN
  Administered 2023-12-23 (×3): 160 ug via INTRAVENOUS

## 2023-12-23 MED ORDER — CEFAZOLIN SODIUM-DEXTROSE 2-4 GM/100ML-% IV SOLN
2.0000 g | INTRAVENOUS | Status: AC
  Administered 2023-12-23: 2 g via INTRAVENOUS
  Filled 2023-12-23: qty 100

## 2023-12-23 MED ORDER — DEXAMETHASONE SOD PHOSPHATE PF 10 MG/ML IJ SOLN
INTRAMUSCULAR | Status: DC | PRN
  Administered 2023-12-23: 5 mg via INTRAVENOUS

## 2023-12-23 MED ORDER — SUGAMMADEX SODIUM 200 MG/2ML IV SOLN
INTRAVENOUS | Status: DC | PRN
Start: 2023-12-23 — End: 2023-12-23
  Administered 2023-12-23: 200 mg via INTRAVENOUS

## 2023-12-23 MED ORDER — ACETAMINOPHEN 500 MG PO TABS
1000.0000 mg | ORAL_TABLET | Freq: Once | ORAL | Status: DC
  Filled 2023-12-23: qty 2

## 2023-12-23 MED ORDER — OXYCODONE HCL 5 MG PO TABS
5.0000 mg | ORAL_TABLET | Freq: Four times a day (QID) | ORAL | 0 refills | Status: AC | PRN
Start: 2023-12-23 — End: ?

## 2023-12-23 MED ORDER — HYDROMORPHONE HCL 1 MG/ML IJ SOLN
INTRAMUSCULAR | Status: AC
  Filled 2023-12-23: qty 1

## 2023-12-23 MED ORDER — PHENYLEPHRINE 80 MCG/ML (10ML) SYRINGE FOR IV PUSH (FOR BLOOD PRESSURE SUPPORT)
PREFILLED_SYRINGE | INTRAVENOUS | Status: AC
  Filled 2023-12-23: qty 10

## 2023-12-23 MED ORDER — MIDAZOLAM HCL (PF) 2 MG/2ML IJ SOLN: 0.5000 mg | Freq: Once | INTRAMUSCULAR | Status: DC | PRN

## 2023-12-23 SURGICAL SUPPLY — 31 items
BAG COUNTER SPONGE SURGICOUNT (BAG) ×1 IMPLANT
CANISTER SUCTION 3000ML PPV (SUCTIONS) ×1 IMPLANT
CHLORAPREP W/TINT 26 (MISCELLANEOUS) ×1 IMPLANT
CLIP APPLIE 5 13 M/L LIGAMAX5 (MISCELLANEOUS) ×1 IMPLANT
CNTNR URN SCR LID CUP LEK RST (MISCELLANEOUS) IMPLANT
COVER SURGICAL LIGHT HANDLE (MISCELLANEOUS) ×1 IMPLANT
DERMABOND ADVANCED .7 DNX12 (GAUZE/BANDAGES/DRESSINGS) ×1 IMPLANT
DRAPE LAPAROSCOPIC ABDOMINAL (DRAPES) IMPLANT
ELECTRODE REM PT RTRN 9FT ADLT (ELECTROSURGICAL) ×1 IMPLANT
GLOVE SURG SIGNA 7.5 PF LTX (GLOVE) ×1 IMPLANT
GOWN STRL REUS W/ TWL LRG LVL3 (GOWN DISPOSABLE) ×2 IMPLANT
GOWN STRL REUS W/ TWL XL LVL3 (GOWN DISPOSABLE) ×1 IMPLANT
IRRIGATION SUCT STRKRFLW 2 WTP (MISCELLANEOUS) ×1 IMPLANT
KIT BASIN OR (CUSTOM PROCEDURE TRAY) ×1 IMPLANT
KIT TURNOVER KIT B (KITS) ×1 IMPLANT
PAD ARMBOARD POSITIONER FOAM (MISCELLANEOUS) ×1 IMPLANT
SCISSORS LAP 5X35 DISP (ENDOMECHANICALS) ×1 IMPLANT
SET TUBE SMOKE EVAC HIGH FLOW (TUBING) ×1 IMPLANT
SLEEVE Z-THREAD 5X100MM (TROCAR) ×2 IMPLANT
SOLN 0.9% NACL POUR BTL 1000ML (IV SOLUTION) ×1 IMPLANT
SOLN STERILE WATER BTL 1000 ML (IV SOLUTION) ×1 IMPLANT
SUT MNCRL AB 4-0 PS2 18 (SUTURE) ×1 IMPLANT
SUT VICRYL 0 UR6 27IN ABS (SUTURE) IMPLANT
SYSTEM BAG RETRIEVAL 10MM (BASKET) ×1 IMPLANT
TOWEL GREEN STERILE (TOWEL DISPOSABLE) ×1 IMPLANT
TOWEL GREEN STERILE FF (TOWEL DISPOSABLE) ×1 IMPLANT
TRAY LAPAROSCOPIC MC (CUSTOM PROCEDURE TRAY) ×1 IMPLANT
TROCAR BALLN 12MMX100 BLUNT (TROCAR) ×1 IMPLANT
TROCAR Z-THREAD OPTICAL 5X100M (TROCAR) ×1 IMPLANT
WARMER LAPAROSCOPE (MISCELLANEOUS) ×1 IMPLANT
YANKAUER SUCT BULB TIP NO VENT (SUCTIONS) IMPLANT

## 2023-12-23 NOTE — Anesthesia Preprocedure Evaluation (Addendum)
 Anesthesia Evaluation  Patient identified by MRN, date of birth, ID band Patient awake    Reviewed: Allergy & Precautions, NPO status , Patient's Chart, lab work & pertinent test results  History of Anesthesia Complications Negative for: history of anesthetic complications  Airway Mallampati: III  TM Distance: >3 FB Neck ROM: Full    Dental  (+) Dental Advisory Given   Pulmonary asthma , sleep apnea and Continuous Positive Airway Pressure Ventilation , COPD,  COPD inhaler   breath sounds clear to auscultation       Cardiovascular hypertension, Pt. on medications (-) angina  Rhythm:Regular Rate:Normal  '20 Stress: Nuclear stress EF: 68%. The left ventricular ejection fraction is hyperdynamic (>65%). Blood pressure demonstrated a normal response to exercise. There was no ST segment deviation noted during stress. Defect 1: There is a small defect of mild severity present in the basal inferoseptal location. This is a low risk study. No evidence of ischemia or MI Normal EF  '20 ECHO: EF 60-65%, normal LVF, mild LVH, no significant valvular abnormalities   Neuro/Psych  Headaches  Anxiety Depression       GI/Hepatic Neg liver ROS,GERD  Medicated and Controlled,,  Endo/Other  diabetes (glu 187), Insulin  Dependent, Oral Hypoglycemic Agents  BMI 34 Mounjaro: 3 weeks ago  Renal/GU negative Renal ROS     Musculoskeletal   Abdominal   Peds  Hematology Hb 15.1, plt 227k   Anesthesia Other Findings   Reproductive/Obstetrics                              Anesthesia Physical Anesthesia Plan  ASA: 3  Anesthesia Plan: General   Post-op Pain Management: Tylenol  PO (pre-op)*   Induction: Intravenous  PONV Risk Score and Plan: 2 and Ondansetron  and Dexamethasone  Airway Management Planned: Oral ETT  Additional Equipment: None  Intra-op Plan:   Post-operative Plan: Extubation in  OR  Informed Consent: I have reviewed the patients History and Physical, chart, labs and discussed the procedure including the risks, benefits and alternatives for the proposed anesthesia with the patient or authorized representative who has indicated his/her understanding and acceptance.     Dental advisory given  Plan Discussed with: CRNA and Surgeon  Anesthesia Plan Comments:          Anesthesia Quick Evaluation

## 2023-12-23 NOTE — Anesthesia Procedure Notes (Signed)
 Procedure Name: Intubation Date/Time: 12/23/2023 1:24 PM  Performed by: Boyce Shilling, CRNAPre-anesthesia Checklist: Patient identified, Emergency Drugs available, Suction available, Timeout performed and Patient being monitored Patient Re-evaluated:Patient Re-evaluated prior to induction Oxygen  Delivery Method: Circle system utilized Preoxygenation: Pre-oxygenation with 100% oxygen  Induction Type: IV induction Ventilation: Mask ventilation without difficulty and Oral airway inserted - appropriate to patient size Laryngoscope Size: Mac and 4 Grade View: Grade III Tube type: Oral Tube size: 7.5 mm Number of attempts: 1 Airway Equipment and Method: Stylet Placement Confirmation: ETT inserted through vocal cords under direct vision, positive ETCO2, CO2 detector and breath sounds checked- equal and bilateral Secured at: 23 cm Tube secured with: Tape Dental Injury: Teeth and Oropharynx as per pre-operative assessment  Difficulty Due To: Difficulty was anticipated, Difficult Airway- due to limited oral opening and Difficult Airway- due to large tongue

## 2023-12-23 NOTE — Anesthesia Postprocedure Evaluation (Signed)
 Anesthesia Post Note  Patient: FELIBERTO STOCKLEY  Procedure(s) Performed: LAPAROSCOPIC CHOLECYSTECTOMY     Patient location during evaluation: PACU Anesthesia Type: General Level of consciousness: awake and alert, patient cooperative and oriented Pain management: pain level controlled Vital Signs Assessment: post-procedure vital signs reviewed and stable Respiratory status: spontaneous breathing, nonlabored ventilation and respiratory function stable Cardiovascular status: blood pressure returned to baseline and stable Postop Assessment: no apparent nausea or vomiting and able to ambulate Anesthetic complications: no   No notable events documented.  Last Vitals:  Vitals:   12/23/23 1500 12/23/23 1515  BP: 103/60   Pulse: 87 89  Resp: 20 19  Temp:    SpO2: 95% 98%    Last Pain:  Vitals:   12/23/23 1451  TempSrc:   PainSc: 5                  Anetra Czerwinski,E. Josephyne Tarter

## 2023-12-23 NOTE — Interval H&P Note (Signed)
 History and Physical Interval Note: no change in H and P  12/23/2023 12:41 PM  Donald Payne  has presented today for surgery, with the diagnosis of SYMPTOMATIC GALLSTONES.  The various methods of treatment have been discussed with the patient and family. After consideration of risks, benefits and other options for treatment, the patient has consented to  Procedure(s): LAPAROSCOPIC CHOLECYSTECTOMY (N/A) as a surgical intervention.  The patient's history has been reviewed, patient examined, no change in status, stable for surgery.  I have reviewed the patient's chart and labs.  Questions were answered to the patient's satisfaction.     Vicenta Poli

## 2023-12-23 NOTE — Op Note (Signed)
 Laparoscopic Cholecystectomy Procedure Note  Indications: This patient presents with symptomatic cholelithiasis and will undergo laparoscopic cholecystectomy.  Pre-operative Diagnosis: symptomatic cholelithiasis  Post-operative Diagnosis: same  Surgeon: Vicenta Poli   Assistants: none  Anesthesia: General endotracheal anesthesia  ASA Class: 2  Procedure Details  The patient was seen again in the Holding Room. The risks, benefits, complications, treatment options, and expected outcomes were discussed with the patient. The possibilities of reaction to medication, pulmonary aspiration, perforation of viscus, bleeding, recurrent infection, finding a normal gallbladder, the need for additional procedures, failure to diagnose a condition, the possible need to convert to an open procedure, and creating a complication requiring transfusion or operation were discussed with the patient. The likelihood of improving the patient's symptoms with return to their baseline status is good.  The patient and/or family concurred with the proposed plan, giving informed consent. The site of surgery properly noted. The patient was taken to Operating Room, identified as Donald Payne and the procedure verified as Laparoscopic Cholecystectomy with Intraoperative Cholangiogram. A Time Out was held and the above information confirmed.  Prior to the induction of general anesthesia, antibiotic prophylaxis was administered. General endotracheal anesthesia was then administered and tolerated well. After the induction, the abdomen was prepped with Chloraprep and draped in sterile fashion. The patient was positioned in the supine position.  Local anesthetic agent was injected into the skin near the umbilicus and an incision made. We dissected down to the abdominal fascia with blunt dissection.  The fascia was incised vertically and we entered the peritoneal cavity bluntly.  A pursestring suture of 0-Vicryl was placed  around the fascial opening.  The Hasson cannula was inserted and secured with the stay suture.  Pneumoperitoneum was then created with CO2 and tolerated well without any adverse changes in the patient's vital signs. A 5-mm port was placed in the subxiphoid position.  Two 5-mm ports were placed in the right upper quadrant. All skin incisions were infiltrated with a local anesthetic agent before making the incision and placing the trocars.   We positioned the patient in reverse Trendelenburg, tilted slightly to the patient's left.  The patient had a fatty liver. The gallbladder was identified, the fundus grasped and retracted cephalad. Adhesions were lysed bluntly and with the electrocautery taking care not to injure any adjacent organs or viscus. The infundibulum was grasped and retracted laterally, exposing the peritoneum overlying the triangle of Calot. This was then divided and exposed in a blunt fashion. The cystic duct was clearly identified and bluntly dissected circumferentially. A critical view of the cystic duct and cystic artery was obtained.  The cystic duct was then ligated with clips and divided. The cystic artery was, dissected free, ligated with clips and divided as well.   The gallbladder was dissected from the liver bed in retrograde fashion with the electrocautery. The gallbladder was removed and placed in an Endocatch sac. The liver bed was irrigated and inspected. Hemostasis was achieved with the electrocautery. Copious irrigation was utilized and was repeatedly aspirated until clear.  The gallbladder and Endocatch sac were then removed through the umbilical port site.  The pursestring suture was used to close the umbilical fascia.  The fascia was then re-enforced with another 0 vicryl suture.  We again inspected the right upper quadrant for hemostasis.  Pneumoperitoneum was released as we removed the trocars.  4-0 Monocryl was used to close the skin.   Benzoin, steri-strips, and clean  dressings were applied. The patient was then extubated and  brought to the recovery room in stable condition. Instrument, sponge, and needle counts were correct at closure and at the conclusion of the case.   Findings: Chronic cholecystitis with Cholelithiasis Fatty liver  Estimated Blood Loss: Minimal         Drains: none         Specimens: Gallbladder           Complications: None; patient tolerated the procedure well.         Disposition: PACU - hemodynamically stable.         Condition: stable

## 2023-12-23 NOTE — Transfer of Care (Signed)
 Immediate Anesthesia Transfer of Care Note  Patient: Donald Payne  Procedure(s) Performed: LAPAROSCOPIC CHOLECYSTECTOMY  Patient Location: PACU  Anesthesia Type:General  Level of Consciousness: awake and alert   Airway & Oxygen  Therapy: Patient Spontanous Breathing and Patient connected to nasal cannula oxygen   Post-op Assessment: Report given to RN and Post -op Vital signs reviewed and stable  Post vital signs: Reviewed and stable  Last Vitals:  Vitals Value Taken Time  BP 144/75 12/23/23 14:20  Temp    Pulse 81 12/23/23 14:24  Resp 15 12/23/23 14:24  SpO2 92 % 12/23/23 14:24  Vitals shown include unfiled device data.  Last Pain:  Vitals:   12/23/23 1246  TempSrc:   PainSc: 0-No pain         Complications: No notable events documented.

## 2023-12-23 NOTE — Discharge Instructions (Signed)
 [facility]__[facility]  UMBILICAL OR INGUINAL HERNIA REPAIR: POST OP INSTRUCTIONS  Always review your discharge instruction sheet given to you by the facility where your surgery was performed. IF YOU HAVE DISABILITY OR FAMILY LEAVE FORMS, YOU MUST BRING THEM TO THE OFFICE FOR PROCESSING.   DO NOT GIVE THEM TO YOUR DOCTOR.  1. A  prescription for pain medication may be given to you upon discharge.  Take your pain medication as prescribed, if needed.  If narcotic pain medicine is not needed, then you may take acetaminophen  (Tylenol ) or ibuprofen  (Advil ) as needed. 2. Take your usually prescribed medications unless otherwise directed. If you need a refill on your pain medication, please contact your pharmacy.  They will contact our office to request authorization. Prescriptions will not be filled after 5 pm or on week-ends. 3. You should follow a light diet the first 24 hours after arrival home, such as soup and crackers, etc.  Be sure to include lots of fluids daily.  Resume your normal diet the day after surgery. 4.Most patients will experience some swelling and bruising around the umbilicus or in the groin and scrotum.  Ice packs and reclining will help.  Swelling and bruising can take several days to resolve.  6. It is common to experience some constipation if taking pain medication after surgery.  Increasing fluid intake and taking a stool softener (such as Colace) will usually help or prevent this problem from occurring.  A mild laxative (Milk of Magnesia or Miralax) should be taken according to package directions if there are no bowel movements after 48 hours. 7. Unless discharge instructions indicate otherwise, you may remove your bandages 24-48 hours after surgery, and you may shower at that time.  You may have steri-strips (small skin tapes) in place directly over the incision.  These strips should be left on the skin for 7-10 days.  If your surgeon used skin glue on the  incision, you may shower in 24 hours.  The glue will flake off over the next 2-3 weeks.  Any sutures or staples will be removed at the office during your follow-up visit. 8. ACTIVITIES:  You may resume regular (light) daily activities beginning the next day--such as daily self-care, walking, climbing stairs--gradually increasing activities as tolerated.  You may have sexual intercourse when it is comfortable.  Refrain from any heavy lifting or straining until approved by your doctor.  a.You may drive when you are no longer taking prescription pain medication, you can comfortably wear a seatbelt, and you can safely maneuver your car and apply brakes. b.RETURN TO WORK:  12/28/2023 _____________________________________________  9.You should see your doctor in the office for a follow-up appointment approximately 2-3 weeks after your surgery.  Make sure that you call for this appointment within a day or two after you arrive home to insure a convenient appointment time. 10.OTHER INSTRUCTIONS: YOU MAY SHOWER STARTING TOMORROW ICE PACK, TYLENOL , AND IBUPROFEN  ALSO FOR PAIN NO LIFTING MORE THAN 15 POUNDS FOR 2 WEEKS     _____________________________________  WHEN TO CALL YOUR DOCTOR: Fever over 101.0 Inability to urinate Nausea and/or vomiting Extreme swelling or bruising Continued bleeding from incision. Increased pain, redness, or drainage from the incision  The clinic staff is available to answer your questions during regular business hours.  Please don't hesitate to call and ask to speak to one of the nurses for clinical concerns.  If you have a medical emergency, go to the nearest emergency room or call 911.  A surgeon from [facility] is always on call at the hospital   [address], [address], [place], [place]  [address] ?  [address] [address], [place], [place]   [address] [phone] ? [phone] ? FAX [phone] Web site: [email or web address]

## 2023-12-24 ENCOUNTER — Encounter (HOSPITAL_COMMUNITY): Payer: Self-pay | Admitting: Surgery

## 2023-12-24 LAB — SURGICAL PATHOLOGY

## 2023-12-28 ENCOUNTER — Other Ambulatory Visit: Payer: Self-pay | Admitting: Adult Health

## 2023-12-28 DIAGNOSIS — F411 Generalized anxiety disorder: Secondary | ICD-10-CM

## 2023-12-29 ENCOUNTER — Encounter: Payer: Self-pay | Admitting: Adult Health

## 2023-12-29 ENCOUNTER — Ambulatory Visit (INDEPENDENT_AMBULATORY_CARE_PROVIDER_SITE_OTHER): Admitting: Adult Health

## 2023-12-29 DIAGNOSIS — F331 Major depressive disorder, recurrent, moderate: Secondary | ICD-10-CM | POA: Diagnosis not present

## 2023-12-29 DIAGNOSIS — G4700 Insomnia, unspecified: Secondary | ICD-10-CM | POA: Diagnosis not present

## 2023-12-29 DIAGNOSIS — F429 Obsessive-compulsive disorder, unspecified: Secondary | ICD-10-CM

## 2023-12-29 DIAGNOSIS — F411 Generalized anxiety disorder: Secondary | ICD-10-CM | POA: Diagnosis not present

## 2023-12-29 MED ORDER — BUSPIRONE HCL 10 MG PO TABS: 20.0000 mg | ORAL_TABLET | Freq: Two times a day (BID) | ORAL | 5 refills | Status: AC

## 2023-12-29 MED ORDER — ALPRAZOLAM 0.5 MG PO TABS: 0.5000 mg | ORAL_TABLET | Freq: Every evening | ORAL | 2 refills | Status: AC | PRN

## 2023-12-29 MED ORDER — TRINTELLIX 20 MG PO TABS: 20.0000 mg | ORAL_TABLET | Freq: Every day | ORAL | 5 refills | Status: AC

## 2023-12-29 MED ORDER — DOXEPIN HCL 10 MG PO CAPS: 10.0000 mg | ORAL_CAPSULE | Freq: Every day | ORAL | 2 refills | Status: AC

## 2023-12-29 MED ORDER — PRAMIPEXOLE DIHYDROCHLORIDE 1 MG PO TABS: 1.0000 mg | ORAL_TABLET | Freq: Every day | ORAL | 5 refills | Status: AC

## 2023-12-29 NOTE — Progress Notes (Signed)
 GIANN OBARA [id number] [birth year] 58 y.o.  Subjective:   Patient ID:  Donald Payne is a 58 y.o. (DOB 03-Nov-1965) male.  Chief Complaint: No chief complaint on file.   HPI FINNIAN HUSTED presents to the office today for follow-up of MDD, GAD, insomnia and OCD.  Previously seen at the [facility].   Describes mood today as ok. Pleasant. Denies tearfulness. Mood symptoms - denies depression, anxiety and irritability. Reports some situational stressors - family. Reports stable interest and motivation. Denies recent panic attacks. Reports some worry, rumination and over thinking - more so with recent surgery. Reports mood as stable. Stating I feel like I'm doing fairly well. Taking medications as prescribed.  Energy levels lower with recent surgery. Active, does not have a regular exercise routine with physical limitations. Enjoys some usual interests and activities. Married. Lives with wife. Wife is a traveling engineer, civil (consulting). No children - stepchildren. Spending time with family. Appetite adequate. Weight gain - 25 to 270 pounds - plans to restart Monjauro. Reports sleeping difficulties. Has 1 night a week where he doesn't sleep. Averages 5 to 6 hours. Reports focus and concentration stable. Completing tasks. Managing aspects of household. Retired - [employer]. Working at [employer]. Denies SI or HI.  Denies AH or VH. Denies self harm.  Denies substance use.  Previous medication trials: Ambien, Lithium, Caplyta and others.   Flowsheet Row Admission (Discharged) from 12/23/2023 in [facility] [facility] Admission (Discharged) from 05/12/2023 in [place] [facility]  C-SSRS RISK CATEGORY No Risk No Risk     Review of Systems:  Review of Systems  Musculoskeletal:  Negative for gait problem.  Neurological:  Negative for tremors.  Psychiatric/Behavioral:         Please refer to HPI    Medications: I have reviewed the patient's current  medications.  Current Outpatient Medications  Medication Sig Dispense Refill   albuterol  (PROVENTIL ) (2.5 MG/3ML) 0.083% nebulizer solution Take 2.5 mg by nebulization 4 (four) times daily as needed for wheezing or shortness of breath.     albuterol  (VENTOLIN  HFA) 108 (90 Base) MCG/ACT inhaler Can inhale two puffs every four to six hours as needed for cough or wheeze. 18 g 0   ALPRAZolam  (XANAX ) 0.5 MG tablet Take 1 tablet (0.5 mg total) by mouth at bedtime as needed for anxiety. 30 tablet 2   aspirin  EC 325 MG tablet Take 1 tablet (325 mg total) by mouth daily. (Patient not taking: Reported on 12/15/2023) 30 tablet 0   azelastine  (ASTELIN ) 0.1 % nasal spray Can use one to two sprays in each nostril one to two times daily as directed. (Patient not taking: Reported on 05/06/2023) 30 mL 5   BELSOMRA 10 MG TABS Take 10 mg by mouth at bedtime as needed (sleep).     busPIRone  (BUSPAR ) 10 MG tablet Take 2 tablets (20 mg total) by mouth 2 (two) times daily. 120 tablet 5   busPIRone  (BUSPAR ) 30 MG tablet TAKE 1 TABLET BY MOUTH 2 TIMES DAILY. (Patient not taking: Reported on 12/15/2023) 180 tablet 0   celecoxib  (CELEBREX ) 200 MG capsule TAKE 1 CAPSULE (200 MG TOTAL) BY MOUTH 2 (TWO) TIMES DAILY BETWEEN MEALS AS NEEDED. (Patient not taking: Reported on 05/06/2023) 60 capsule 1   doxepin  (SINEQUAN ) 10 MG capsule Take 1 capsule (10 mg total) by mouth at bedtime. 30 capsule 2   Erenumab -aooe (AIMOVIG ) 70 MG/ML SOAJ INJECT 70 MG INTO THE SKIN EVERY 30 DAYS (Patient not  taking: Reported on 05/06/2023) 1 mL 1   famotidine  (PEPCID ) 40 MG tablet TAKE ONE TABLET ONCE EACH EVENING (Patient not taking: Reported on 12/15/2023) 30 tablet 0   fenofibrate micronized (ANTARA) 43 MG capsule Take 43 mg by mouth daily.     gabapentin  (NEURONTIN ) 300 MG capsule TAKE 2 CAPSULES BY MOUTH 2 TIMES DAILY. 120 capsule 3   HUMALOG 100 UNIT/ML injection Inject 0-200 Units into the skin See admin instructions. Used with Omnipod, fill with 200  units every 3 days     HYDROcodone -acetaminophen  (NORCO) 7.5-325 MG tablet Take 1 tablet by mouth every 6 (six) hours as needed for moderate pain. (Patient not taking: Reported on 05/06/2023) 30 tablet 0   JARDIANCE 25 MG TABS tablet Take 25 mg by mouth daily.     lisinopril-hydrochlorothiazide (ZESTORETIC) 20-12.5 MG tablet Take 1 tablet by mouth daily.     oxyCODONE  (OXY IR/ROXICODONE ) 5 MG immediate release tablet Take 1 tablet (5 mg total) by mouth every 6 (six) hours as needed for moderate pain (pain score 4-6) or severe pain (pain score 7-10). 25 tablet 0   oxyCODONE  (ROXICODONE ) 5 MG immediate release tablet Take 1 tablet (5 mg total) by mouth every 4 (four) hours as needed for severe pain (pain score 7-10) or breakthrough pain. (Patient not taking: Reported on 12/15/2023) 10 tablet 0   pantoprazole  (PROTONIX ) 40 MG tablet TAKE TWO TABLETS EVERY MORNING AS DIRECTED. (Patient taking differently: Take 40 mg by mouth daily.) 60 tablet 0   pramipexole  (MIRAPEX ) 1 MG tablet Take 1 tablet (1 mg total) by mouth daily. 30 tablet 5   predniSONE  (DELTASONE ) 20 MG tablet Take 1 tablet (20 mg total) by mouth daily with breakfast. (Patient not taking: Reported on 05/06/2023) 6 tablet 0   rizatriptan (MAXALT) 10 MG tablet Take 10 mg by mouth as needed for migraine.     simvastatin (ZOCOR) 40 MG tablet Take 40 mg by mouth every evening.     SUMAtriptan  (IMITREX ) 100 MG tablet Take 1 tablet (100 mg total) by mouth every 2 (two) hours as needed for migraine. May repeat in 2 hours if headache persists or recurs. (Patient not taking: Reported on 05/06/2023) 10 tablet 2   SYMBICORT  160-4.5 MCG/ACT inhaler INHALE TWO PUFFS TWICE DAILY TO PREVENT COUGH OR WHEEZE. RINSE MOUTH AFTER USE. (Patient not taking: No sig reported) 10.2 each 0   tirzepatide (MOUNJARO) 7.5 MG/0.5ML Pen Inject 7.5 mg into the skin once a week.     TRINTELLIX  20 MG TABS tablet Take 1 tablet (20 mg total) by mouth daily. 30 tablet 5   Ubrogepant 100  MG TABS Take 100 mg by mouth daily as needed (MIGRAINE).     VITAMIN D PO Take 5,000 Units by mouth daily.     No current facility-administered medications for this visit.    Medication Side Effects: None  Allergies:  Allergies  Allergen Reactions   Atorvastatin Other (See Comments)    Muscle soreness  Muscle soreness  Muscle soreness  Muscle soreness, Muscle soreness   Budesonide-Formoterol Fumarate Other (See Comments)    Tongue/throat soreness   Metformin Hcl Diarrhea    Other reaction(s): Diarhea    Past Medical History:  Diagnosis Date   Allergic rhinitis    Allergy    Anxiety    Asthma    Depression    Diabetes mellitus (HCC)    Dyslipidemia    Fatigue    GERD (gastroesophageal reflux disease)    Headache  Hemorrhoids    Hyperlipidemia    Hypertension    Hypoxia    IBS (irritable bowel syndrome)    Low testosterone    Prostatitis    Sleep apnea    uses CPAP   Sleep apnea with hypersomnolence    Vitamin D deficiency     Past Medical History, Surgical history, Social history, and Family history were reviewed and updated as appropriate.   Please see review of systems for further details on the patient's review from today.   Objective:   Physical Exam:  There were no vitals taken for this visit.  Physical Exam Constitutional:      General: He is not in acute distress. Musculoskeletal:        General: No deformity.  Neurological:     Mental Status: He is alert and oriented to person, place, and time.     Coordination: Coordination normal.  Psychiatric:        Attention and Perception: Attention and perception normal. He does not perceive auditory or visual hallucinations.        Mood and Affect: Mood normal. Mood is not anxious or depressed. Affect is not labile, blunt, angry or inappropriate.        Speech: Speech normal.        Behavior: Behavior normal.        Thought Content: Thought content normal. Thought content is not paranoid or  delusional. Thought content does not include homicidal or suicidal ideation. Thought content does not include homicidal or suicidal plan.        Cognition and Memory: Cognition and memory normal.        Judgment: Judgment normal.     Comments: Insight intact     Lab Review:     Component Value Date/Time   NA 142 12/17/2023 1430   K 3.9 12/17/2023 1430   CL 108 12/17/2023 1430   CO2 24 12/17/2023 1430   GLUCOSE 221 (H) 12/17/2023 1430   BUN 30 (H) 12/17/2023 1430   CREATININE 1.45 (H) 12/17/2023 1430   CALCIUM 9.2 12/17/2023 1430   GFRNONAA 57 (L) 12/17/2023 1430       Component Value Date/Time   WBC 9.9 12/17/2023 1430   RBC 5.38 12/17/2023 1430   HGB 15.1 12/17/2023 1430   HGB 16.1 04/08/2018 0811   HCT 47.2 12/17/2023 1430   HCT 47.4 04/08/2018 0811   PLT 227 12/17/2023 1430   PLT 165 04/08/2018 0811   MCV 87.7 12/17/2023 1430   MCV 87 04/08/2018 0811   MCH 28.1 12/17/2023 1430   MCHC 32.0 12/17/2023 1430   RDW 14.3 12/17/2023 1430   RDW 14.2 04/08/2018 0811   LYMPHSABS 1.1 04/08/2018 0811   EOSABS 0.3 04/08/2018 0811   BASOSABS 0.1 04/08/2018 0811    No results found for: POCLITH, LITHIUM   No results found for: PHENYTOIN, PHENOBARB, VALPROATE, CBMZ   .res Assessment: Plan:    Plan:  PDMP reviewed  Trintellix  20mg  daily Doxepin  10mg  at hs  Buspar  20mg  BID  Mirapex  1.5mg  daily   Xanax  0.5mg  at hs for sleep   RTC 3 months  25 minutes spent dedicated to the care of this patient on the date of this encounter to include pre-visit review of records, ordering of medication, post visit documentation, and face-to-face time with the patient discussing MDD, GAD, OCD, and insomnia. Discussed continuing current medication regimen.  Patient advised to contact office with any questions, adverse effects, or acute worsening in signs and symptoms.  Discussed potential benefits, risk, and side effects of benzodiazepines to include potential risk of  tolerance and dependence, as well as possible drowsiness.  Advised patient not to drive if experiencing drowsiness and to take lowest possible effective dose to minimize risk of dependence and tolerance.    Diagnoses and all orders for this visit:  Insomnia, unspecified type -     doxepin  (SINEQUAN ) 10 MG capsule; Take 1 capsule (10 mg total) by mouth at bedtime.  Generalized anxiety disorder -     ALPRAZolam  (XANAX ) 0.5 MG tablet; Take 1 tablet (0.5 mg total) by mouth at bedtime as needed for anxiety. -     pramipexole  (MIRAPEX ) 1 MG tablet; Take 1 tablet (1 mg total) by mouth daily. -     busPIRone  (BUSPAR ) 10 MG tablet; Take 2 tablets (20 mg total) by mouth 2 (two) times daily.  Major depressive disorder, recurrent episode, moderate (HCC) -     TRINTELLIX  20 MG TABS tablet; Take 1 tablet (20 mg total) by mouth daily.  Obsessive-compulsive disorder, unspecified -     busPIRone  (BUSPAR ) 10 MG tablet; Take 2 tablets (20 mg total) by mouth 2 (two) times daily.     Please see After Visit Summary for patient specific instructions.  No future appointments.   No orders of the defined types were placed in this encounter.   -------------------------------

## 2024-02-16 ENCOUNTER — Other Ambulatory Visit: Payer: Self-pay | Admitting: Adult Health

## 2024-02-16 DIAGNOSIS — F411 Generalized anxiety disorder: Secondary | ICD-10-CM

## 2024-02-16 DIAGNOSIS — F429 Obsessive-compulsive disorder, unspecified: Secondary | ICD-10-CM

## 2024-03-30 ENCOUNTER — Ambulatory Visit: Admitting: Adult Health
